# Patient Record
Sex: Male | Born: 1970 | Race: White | Hispanic: No
Health system: Midwestern US, Community
[De-identification: ages and names within clinical notes are randomized; demographics above are authoritative.]

## PROBLEM LIST (undated history)

## (undated) DIAGNOSIS — F32A Depression, unspecified: Secondary | ICD-10-CM

## (undated) DIAGNOSIS — S069XAA Unspecified intracranial injury with loss of consciousness status unknown, initial encounter: Secondary | ICD-10-CM

## (undated) DIAGNOSIS — I10 Essential (primary) hypertension: Secondary | ICD-10-CM

## (undated) DIAGNOSIS — G40409 Other generalized epilepsy and epileptic syndromes, not intractable, without status epilepticus: Secondary | ICD-10-CM

## (undated) DIAGNOSIS — J45909 Unspecified asthma, uncomplicated: Secondary | ICD-10-CM

## (undated) DIAGNOSIS — R519 Headache, unspecified: Secondary | ICD-10-CM

## (undated) DIAGNOSIS — F319 Bipolar disorder, unspecified: Secondary | ICD-10-CM

## (undated) DIAGNOSIS — F329 Major depressive disorder, single episode, unspecified: Secondary | ICD-10-CM

## (undated) DIAGNOSIS — R51 Headache: Secondary | ICD-10-CM

## (undated) DIAGNOSIS — N189 Chronic kidney disease, unspecified: Secondary | ICD-10-CM

## (undated) DIAGNOSIS — E109 Type 1 diabetes mellitus without complications: Secondary | ICD-10-CM

## (undated) DIAGNOSIS — G4730 Sleep apnea, unspecified: Secondary | ICD-10-CM

## (undated) DIAGNOSIS — G43909 Migraine, unspecified, not intractable, without status migrainosus: Secondary | ICD-10-CM

## (undated) DIAGNOSIS — S069X9A Unspecified intracranial injury with loss of consciousness of unspecified duration, initial encounter: Secondary | ICD-10-CM

## (undated) DIAGNOSIS — E7800 Pure hypercholesterolemia, unspecified: Secondary | ICD-10-CM

## (undated) DIAGNOSIS — I219 Acute myocardial infarction, unspecified: Secondary | ICD-10-CM

## (undated) DIAGNOSIS — F419 Anxiety disorder, unspecified: Secondary | ICD-10-CM

## (undated) DIAGNOSIS — I639 Cerebral infarction, unspecified: Secondary | ICD-10-CM

## (undated) HISTORY — PX: CHOLECYSTECTOMY OPEN: SUR202

---
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
## 2015-04-02 ENCOUNTER — Emergency Department (HOSPITAL_COMMUNITY): Payer: Self-pay

## 2015-04-02 ENCOUNTER — Encounter (HOSPITAL_COMMUNITY): Payer: Self-pay

## 2015-04-02 ENCOUNTER — Emergency Department (HOSPITAL_COMMUNITY)
Admission: EM | Admit: 2015-04-02 | Discharge: 2015-04-02 | Disposition: A | Discharge status: Home / Self Care | Payer: Self-pay | Attending: Emergency Medicine | Admitting: Emergency Medicine

## 2015-04-02 DIAGNOSIS — R079 Chest pain, unspecified: Secondary | ICD-10-CM

## 2015-04-02 DIAGNOSIS — F1410 Cocaine abuse, uncomplicated: Secondary | ICD-10-CM | POA: Insufficient documentation

## 2015-04-02 DIAGNOSIS — F419 Anxiety disorder, unspecified: Secondary | ICD-10-CM

## 2015-04-02 DIAGNOSIS — R000 Tachycardia, unspecified: Secondary | ICD-10-CM | POA: Insufficient documentation

## 2015-04-02 DIAGNOSIS — F1910 Other psychoactive substance abuse, uncomplicated: Secondary | ICD-10-CM

## 2015-04-02 LAB — BASIC METABOLIC PANEL
Anion gap: 12 (ref 5–15)
BUN: 12 mg/dL (ref 6–20)
CHLORIDE: 107 mmol/L (ref 101–111)
CO2: 24 mmol/L (ref 22–32)
CREATININE: 1.16 mg/dL (ref 0.61–1.24)
Calcium: 9.3 mg/dL (ref 8.9–10.3)
GFR calc Af Amer: >60 mL/min (ref >60)
GFR calc non Af Amer: >60 mL/min (ref >60)
GLUCOSE: 97 mg/dL (ref 65–99)
POTASSIUM: 4.1 mmol/L (ref 3.5–5.1)
SODIUM: 143 mmol/L (ref 135–145)

## 2015-04-02 LAB — TROPONIN I

## 2015-04-02 LAB — CBC
HEMATOCRIT: 43.8 % (ref 39.0–52.0)
HEMOGLOBIN: 14.9 g/dL (ref 13.0–17.0)
MCH: 31.4 pg (ref 26.0–34.0)
MCHC: 34 g/dL (ref 30.0–36.0)
MCV: 92.4 fL (ref 78.0–100.0)
Platelets: 276 10*3/uL (ref 150–400)
RBC: 4.74 MIL/uL (ref 4.22–5.81)
RDW: 13.3 % (ref 11.5–15.5)
WBC: 9.2 10*3/uL (ref 4.0–10.5)

## 2015-04-02 LAB — PHENYTOIN LEVEL, TOTAL: Phenytoin Lvl: 9.5 ug/mL — ABNORMAL LOW (ref 10.0–20.0)

## 2015-04-02 LAB — I-STAT TROPONIN, ED: Troponin i, poc: 0 ng/mL (ref 0.00–0.08)

## 2015-04-02 MED ORDER — ASPIRIN 81 MG PO CHEW
324.0000 mg | CHEWABLE_TABLET | Freq: Once | ORAL | Status: AC
Start: 2015-04-02 — End: 2015-04-02
  Administered 2015-04-02: 324 mg via ORAL
  Filled 2015-04-02: qty 4

## 2015-04-02 MED ORDER — PHENYTOIN SODIUM EXTENDED 100 MG PO CAPS: 300.0000 mg | ORAL_CAPSULE | Freq: Every day | ORAL | Status: DC

## 2015-04-02 MED ORDER — SODIUM CHLORIDE 0.9 % IV BOLUS (SEPSIS)
1000.0000 mL | Freq: Once | INTRAVENOUS | Status: AC
  Administered 2015-04-02: 1000 mL via INTRAVENOUS

## 2015-04-02 MED ORDER — LORAZEPAM 2 MG/ML IJ SOLN
0.5000 mg | Freq: Once | INTRAMUSCULAR | Status: AC
  Administered 2015-04-02: 0.5 mg via INTRAVENOUS
  Filled 2015-04-02: qty 1

## 2015-04-02 NOTE — ED Provider Notes (Signed)
CSN: [id number]     Arrival date & time 04/02/15  1728 History   First MD Initiated Contact with Patient 04/02/15 1748     Chief Complaint  Patient presents with  . Chest Pain  . Anxiety     (Consider location/radiation/quality/duration/timing/severity/associated sxs/prior Treatment) HPI Barry Daugherty is a 44 y.o. male with history of seizures, presents to emergency department complaining of chest pain, palpitations, anxiety, and requesting help with substance abuse. Patient reports using cocaine  "when I get money." He states he can go several days without using cocaine. He denies drinking alcohol. He states he takes pills as well, states "only when I can get them." He reports using crack cocaine approximately hour and a half ago. He states he just moved to [place] today because "was to saline has nothing for me." Patient reports he is homeless. He lost his Dilantin which she takes for his seizures. He states he had a seizure yesterday was actually seen in emergency department at [facility] where he received a loading dose of Dilantin. Patient states he was discharged without a prescription because he already had medications, but states when he stays at a shelter last night and he lost all of his medicines. He reports he has been to [facility] for his substance abuse in the past but states it did not help him. He is requesting help with his substance abuse. PT denies hx of cardiac or lung problems. He states he only smokes  when he does drugs.   Past Medical History  Diagnosis Date  . Seizures [facility])    Past Surgical History  Procedure Laterality Date  . Nephrectomy    . Cholecystectomy     No family history on file. Social History  Substance Use Topics  . Smoking status: Not on file  . Smokeless tobacco: Not on file  . Alcohol Use: Not on file    Review of Systems  Constitutional: Negative for fever and chills.  Respiratory: Positive for chest tightness and shortness of  breath. Negative for cough.   Cardiovascular: Positive for chest pain and palpitations. Negative for leg swelling.  Gastrointestinal: Negative for nausea, vomiting, abdominal pain, diarrhea and abdominal distention.  Genitourinary: Negative for urgency.  Musculoskeletal: Negative for myalgias, arthralgias, neck pain and neck stiffness.  Skin: Negative for rash.  Allergic/Immunologic: Negative for immunocompromised state.  Neurological: Negative for dizziness, weakness, light-headedness, numbness and headaches.  Psychiatric/Behavioral: Positive for dysphoric mood. The patient is nervous/anxious.   All other systems reviewed and are negative.     Allergies  Ceftin and Reglan  Home Medications   Prior to Admission medications   Not on File   BP 150/118 mmHg  Pulse 105  Temp(Src) 97.9 F (36.6 C) (Oral)  Resp 15  SpO2 99% Physical Exam  Constitutional: He is oriented to person, place, and time. He appears well-developed and well-nourished.  Disheveled. tearful  HENT:  Head: Normocephalic and atraumatic.  Eyes: Conjunctivae are normal.  Neck: Neck supple.  Cardiovascular: Regular rhythm and normal heart sounds.   tachycardic  Pulmonary/Chest: Effort normal. No respiratory distress. He has no wheezes. He has no rales.  Abdominal: Soft. Bowel sounds are normal. He exhibits no distension. There is no tenderness. There is no rebound.  Musculoskeletal: He exhibits no edema.  Neurological: He is alert and oriented to person, place, and time.  Skin: Skin is warm and dry.  Nursing note and vitals reviewed.   ED Course  Procedures (including critical care time) Labs Review Labs  Reviewed  PHENYTOIN LEVEL, TOTAL - Abnormal; Notable for the following:    Phenytoin Lvl 9.5 (*)    All other components within normal limits  BASIC METABOLIC PANEL  CBC  TROPONIN I  I-STAT TROPOININ, ED    Imaging Review Dg Chest 2 View  04/02/2015  CLINICAL DATA:  Anxiety with tachycardia,  chest pain and dizziness after illicit drug use. EXAM: CHEST  2 VIEW COMPARISON:  None. FINDINGS: The heart size and mediastinal contours are normal. The lungs are clear. There is no pleural effusion or pneumothorax. No acute osseous findings are identified. Surgical clips are noted within the upper abdomen. IMPRESSION: No active cardiopulmonary process. Electronically Signed   By: Carey Bullocks M.D.   On: 04/02/2015 18:46   I have personally reviewed and evaluated these images and lab results as part of my medical decision-making.   EKG Interpretation   Date/Time:  Sunday April 02 2015 17:40:32 EST Ventricular Rate:  111 PR Interval:  156 QRS Duration: 81 QT Interval:  324 QTC Calculation: 440 R Axis:   89 Text Interpretation:  Sinus tachycardia Anterior infarct, old No acute  ischemic findings No previous ECGs available Confirmed by NGUYEN, EMILY  [phone]) on 04/02/2015 7:27:56 PM      MDM   Final diagnoses:  Chest pain, unspecified chest pain type  Anxiety  Polysubstance abuse    patient emergency department with palpitations, chest pain, anxiety after doing cocaine. He is requesting substance abuse detox. Patient is mildly tachycardic and hypotensive upon arrival. Appears anxious. States he just moved from [place] here, however he states he is homeless. I tried to pull up his records from [facility] but was unable to. Will get labs, troponin, chest x-ray.   Initial labs, troponin unremarkable. Patient is tachycardic. 1 L of fluids given IV. Ativan 0.5 mg given IV. Will recheck delta troponin in 4 hours.   Patient chest pain-free. Vitals improved. Heart rate in 70s at rest. Eating sandwich and drinking Coke. Delta troponin is negative. Bloody discharge home with outpatient resources and follow-up. Denies SI and HI, stable for discharge home.  Filed Vitals:   04/02/15 1739 04/02/15 2027 04/02/15 2030 04/02/15 2100  BP: 150/118 140/93 130/78 127/85  Pulse: 105 99  96   Temp: 97.9 F (36.6 C) 98.3 F (36.8 C)    TempSrc: Oral Oral    Resp: 15 18  18   SpO2: 99% 97%  95%     Jaynie Crumble, PA-C 04/03/15 0025  Leta Baptist, MD 04/11/15 [phone]

## 2015-04-02 NOTE — ED Notes (Signed)
Patient was alert, oriented and stable upon discharge. RN went over AVS and patient had no further questions.  

## 2015-04-02 NOTE — ED Notes (Addendum)
Per EMS, Pt. C/o anxiety tachycardia, chest pain, after using crack cocaine 1.5 hr ago . Chest pain aggravated by movement- EMS ekg unremarkable. Hx of Substance abuse- wants help. Pt c/o anxiety, hx of seizures. Having aura, dizziness, light sensitivity, headache. Lost seizure meds last night. 109 cbg

## 2015-04-02 NOTE — Discharge Instructions (Signed)
Make sure to continue your Dilantin. Please follow-up with the resources provided. Return if any issues.  Polysubstance Abuse When people abuse more than one drug or type of drug it is called polysubstance or polydrug abuse. For example, many smokers also drink alcohol. This is one form of polydrug abuse. Polydrug abuse also refers to the use of a drug to counteract an unpleasant effect produced by another drug. It may also be used to help with withdrawal from another drug. People who take stimulants may become agitated. Sometimes this agitation is countered with a tranquilizer. This helps protect against the unpleasant side effects. Polydrug abuse also refers to the use of different drugs at the same time.  Anytime drug use is interfering with normal living activities, it has become abuse. This includes problems with family and friends. Psychological dependence has developed when your mind tells you that the drug is needed. This is usually followed by physical dependence which has developed when continuing increases of drug are required to get the same feeling or "high". This is known as addiction or chemical dependency. A person's risk is much higher if there is a history of chemical dependency in the family. SIGNS OF CHEMICAL DEPENDENCY  You have been told by friends or family that drugs have become a problem.  You fight when using drugs.  You are having blackouts (not remembering what you do while using).  You feel sick from using drugs but continue using.  You lie about use or amounts of drugs (chemicals) used.  You need chemicals to get you going.  You are suffering in work performance or in school because of drug use.  You get sick from use of drugs but continue to use anyway.  You need drugs to relate to people or feel comfortable in social situations.  You use drugs to forget problems. "Yes" answered to any of the above signs of chemical dependency indicates there are problems. The  longer the use of drugs continues, the greater the problems will become. If there is a family history of drug or alcohol use, it is best not to experiment with these drugs. Continual use leads to tolerance. After tolerance develops more of the drug is needed to get the same feeling. This is followed by addiction. With addiction, drugs become the most important part of life. It becomes more important to take drugs than participate in the other usual activities of life. This includes relating to friends and family. Addiction is followed by dependency. Dependency is a condition where drugs are now needed not just to get high, but to feel normal. Addiction cannot be cured but it can be stopped. This often requires outside help and the care of professionals. Treatment centers are listed in the yellow pages under: Cocaine, Narcotics, and Alcoholics Anonymous. Most hospitals and clinics can refer you to a specialized care center. Talk to your caregiver if you need help.   This information is not intended to replace advice given to you by your health care provider. Make sure you discuss any questions you have with your health care provider.   Document Released: 11/21/2004 Document Revised: 06/24/2011 Document Reviewed: 04/06/2014 Elsevier Interactive Patient Education [employer]

## 2015-04-03 ENCOUNTER — Emergency Department (HOSPITAL_COMMUNITY)
Admission: EM | Admit: 2015-04-03 | Discharge: 2015-04-03 | Disposition: A | Discharge status: Other Acute Inpatient Hospital | Payer: Self-pay | Attending: Emergency Medicine | Admitting: Emergency Medicine

## 2015-04-03 ENCOUNTER — Encounter (HOSPITAL_COMMUNITY): Payer: Self-pay | Admitting: Cardiology

## 2015-04-03 DIAGNOSIS — F1410 Cocaine abuse, uncomplicated: Secondary | ICD-10-CM | POA: Insufficient documentation

## 2015-04-03 DIAGNOSIS — F419 Anxiety disorder, unspecified: Secondary | ICD-10-CM | POA: Insufficient documentation

## 2015-04-03 DIAGNOSIS — R197 Diarrhea, unspecified: Secondary | ICD-10-CM | POA: Insufficient documentation

## 2015-04-03 DIAGNOSIS — G40909 Epilepsy, unspecified, not intractable, without status epilepticus: Secondary | ICD-10-CM | POA: Insufficient documentation

## 2015-04-03 DIAGNOSIS — R45851 Suicidal ideations: Secondary | ICD-10-CM

## 2015-04-03 DIAGNOSIS — R079 Chest pain, unspecified: Secondary | ICD-10-CM | POA: Insufficient documentation

## 2015-04-03 DIAGNOSIS — R630 Anorexia: Secondary | ICD-10-CM | POA: Insufficient documentation

## 2015-04-03 DIAGNOSIS — F1310 Sedative, hypnotic or anxiolytic abuse, uncomplicated: Secondary | ICD-10-CM | POA: Insufficient documentation

## 2015-04-03 DIAGNOSIS — F329 Major depressive disorder, single episode, unspecified: Secondary | ICD-10-CM | POA: Insufficient documentation

## 2015-04-03 DIAGNOSIS — Z79899 Other long term (current) drug therapy: Secondary | ICD-10-CM | POA: Insufficient documentation

## 2015-04-03 LAB — COMPREHENSIVE METABOLIC PANEL
ALBUMIN: 3.8 g/dL (ref 3.5–5.0)
ALT: 43 U/L (ref 17–63)
ANION GAP: 9 (ref 5–15)
AST: 35 U/L (ref 15–41)
Alkaline Phosphatase: 65 U/L (ref 38–126)
BUN: 12 mg/dL (ref 6–20)
CALCIUM: 9.7 mg/dL (ref 8.9–10.3)
CO2: 23 mmol/L (ref 22–32)
Chloride: 109 mmol/L (ref 101–111)
Creatinine, Ser: 1.21 mg/dL (ref 0.61–1.24)
GFR calc non Af Amer: >60 mL/min (ref >60)
GLUCOSE: 161 mg/dL — AB (ref 65–99)
POTASSIUM: 3.9 mmol/L (ref 3.5–5.1)
SODIUM: 141 mmol/L (ref 135–145)
TOTAL PROTEIN: 6.5 g/dL (ref 6.5–8.1)
Total Bilirubin: 0.4 mg/dL (ref 0.3–1.2)

## 2015-04-03 LAB — CBC
HEMATOCRIT: 43.1 % (ref 39.0–52.0)
Hemoglobin: 14.2 g/dL (ref 13.0–17.0)
MCH: 30.7 pg (ref 26.0–34.0)
MCHC: 32.9 g/dL (ref 30.0–36.0)
MCV: 93.1 fL (ref 78.0–100.0)
Platelets: 261 10*3/uL (ref 150–400)
RBC: 4.63 MIL/uL (ref 4.22–5.81)
RDW: 13.5 % (ref 11.5–15.5)
WBC: 8 10*3/uL (ref 4.0–10.5)

## 2015-04-03 LAB — ACETAMINOPHEN LEVEL

## 2015-04-03 LAB — ETHANOL: Alcohol, Ethyl (B): <5 mg/dL (ref <5)

## 2015-04-03 LAB — RAPID URINE DRUG SCREEN, HOSP PERFORMED
Amphetamines: NOT DETECTED
BARBITURATES: NOT DETECTED
BENZODIAZEPINES: POSITIVE — AB
COCAINE: POSITIVE — AB
OPIATES: NOT DETECTED
Tetrahydrocannabinol: NOT DETECTED

## 2015-04-03 LAB — SALICYLATE LEVEL

## 2015-04-03 LAB — TROPONIN I

## 2015-04-03 MED ORDER — GABAPENTIN 100 MG PO CAPS
100.0000 mg | ORAL_CAPSULE | Freq: Four times a day (QID) | ORAL | Status: DC
  Administered 2015-04-03 (×2): 100 mg via ORAL
  Filled 2015-04-03 (×2): qty 1

## 2015-04-03 MED ORDER — ATENOLOL 25 MG PO TABS
50.0000 mg | ORAL_TABLET | Freq: Every day | ORAL | Status: DC
  Administered 2015-04-03: 50 mg via ORAL
  Filled 2015-04-03: qty 2

## 2015-04-03 MED ORDER — HYDROXYZINE HCL 25 MG PO TABS: 100.0000 mg | ORAL_TABLET | Freq: Four times a day (QID) | ORAL | Status: DC | PRN

## 2015-04-03 MED ORDER — AMLODIPINE BESYLATE 5 MG PO TABS
10.0000 mg | ORAL_TABLET | Freq: Every day | ORAL | Status: DC
  Administered 2015-04-03: 10 mg via ORAL
  Filled 2015-04-03: qty 2

## 2015-04-03 MED ORDER — PHENYTOIN SODIUM EXTENDED 100 MG PO CAPS
300.0000 mg | ORAL_CAPSULE | Freq: Every day | ORAL | Status: DC
  Administered 2015-04-03: 300 mg via ORAL
  Filled 2015-04-03: qty 3

## 2015-04-03 MED ORDER — HYDROXYZINE PAMOATE 50 MG PO CAPS
100.0000 mg | ORAL_CAPSULE | Freq: Four times a day (QID) | ORAL | Status: DC | PRN
  Filled 2015-04-03: qty 2

## 2015-04-03 MED ORDER — GABAPENTIN 300 MG PO CAPS
300.0000 mg | ORAL_CAPSULE | Freq: Once | ORAL | Status: AC
  Administered 2015-04-03: 300 mg via ORAL
  Filled 2015-04-03: qty 1

## 2015-04-03 NOTE — ED Notes (Signed)
Pt desats down to the low 80s while sleeping. Dr. Criss Alvine informed.

## 2015-04-03 NOTE — Clinical Social Work Note (Signed)
CSW spoke with RN to provide pt disposition of Observation unit, bed 3 available 8 pm this evening.  Elray Buba LCSW [facility]/Triage/TTS

## 2015-04-03 NOTE — ED Notes (Signed)
Bed at Observation for bed 3, not available until 8pm.
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
## 2015-04-03 NOTE — ED Notes (Signed)
Pt ordered lunch tray. Pt encouraged to provide urine specimen.

## 2015-04-03 NOTE — ED Provider Notes (Signed)
Accepted by Dr. Lucianne Muss to [facility]. In stable condition and appropriate for transfer  Lavera Guise, MD 04/03/15 2138

## 2015-04-03 NOTE — Progress Notes (Signed)
CPAP machine set-up for patient prior to transfer to [facility]. RN instructed on how to use machine and all information needed to pass onto receiving RN.

## 2015-04-03 NOTE — ED Notes (Signed)
Pt states he hasn't had access to any of his medications x7 days, but found one dose of each in his pocket yesterday and took them. Pt states he last used crack yesterday about $40 worth and denies any recent crystal meth use. Pt also denies alcohol or opiate use.

## 2015-04-03 NOTE — ED Notes (Signed)
Pt reports that he wants treatment from Detox from crystal meth, and crack. States he is having thoughts of hurting himself over the past couple of days. No plan, no ETOH.

## 2015-04-03 NOTE — ED Notes (Signed)
Staffing called for sitter.   

## 2015-04-03 NOTE — ED Provider Notes (Signed)
CSN: [id number]     Arrival date & time 04/03/15  1106 History   First MD Initiated Contact with Patient 04/03/15 1131     Chief Complaint  Patient presents with  . Medical Clearance  . Suicidal     (Consider location/radiation/quality/duration/timing/severity/associated sxs/prior Treatment) HPI   Mr. Hefner is a 44 year old with a history of polysubstance abuse, anxiety, depression who presents with a desire for assistance abstaining from crack cocaine, SI, and chest pain. He reports last smoking crack cocaine yesterday morning - about "40 dollars worth." Afterward, he had a non-radiating chest pain that has persistent until today. He reports that the chest pain is reproduced when he presses on his chest. He has smoked crack cocaine for years, but he has also used methamphetamine, although not in at least a month. He's had persistent feelings of depression and anxiety. Most recently, he describes a situation of thinking about taking a fistful of his dilantin, but he threw it away before he could do it. His last suicide attempt was when he was 44 years old with an attempted hanging. He has a past medical history of seizures, his last being "last Friday" at [facility], in which he lost consciousness for a time and urinated on himself. He has not taken any dilantin in at least 7 days. He denies any alcohol use.    Past Medical History  Diagnosis Date  . Seizures [facility])    Past Surgical History  Procedure Laterality Date  . Nephrectomy    . Cholecystectomy     History reviewed. No pertinent family history. Social History  Substance Use Topics  . Smoking status: Current Some Day Smoker    Types: Cigarettes  . Smokeless tobacco: None  . Alcohol Use: No    Review of Systems  Constitutional: Positive for appetite change. Negative for fever, chills and fatigue.  HENT: Negative for congestion, rhinorrhea and sore throat.   Eyes: Negative for visual disturbance.  Respiratory: Negative for  cough, shortness of breath and wheezing.   Cardiovascular: Positive for chest pain. Negative for palpitations and leg swelling.  Gastrointestinal: Positive for diarrhea. Negative for nausea, abdominal pain and constipation.       One month of watery diarrhea.  Genitourinary: Negative for dysuria.  Musculoskeletal: Negative for arthralgias.  Skin: Negative for rash.  Neurological: Negative for dizziness and headaches.  Psychiatric/Behavioral: Positive for suicidal ideas and dysphoric mood. The patient is nervous/anxious.       Allergies  Ceftin and Reglan  Home Medications   Prior to Admission medications   Medication Sig Start Date End Date Taking? Authorizing Provider  amLODipine (NORVASC) 10 MG tablet Take 10 mg by mouth daily.    Historical Provider, MD  atenolol (TENORMIN) 50 MG tablet Take 50 mg by mouth daily.    Historical Provider, MD  gabapentin (NEURONTIN) 100 MG capsule Take 100 mg by mouth 4 (four) times daily. 01/17/15   Historical Provider, MD  hydrOXYzine (VISTARIL) 100 MG capsule Take 100 mg by mouth 4 (four) times daily as needed for itching or anxiety.    Historical Provider, MD  phenytoin (DILANTIN) 100 MG ER capsule Take 3 capsules (300 mg total) by mouth at bedtime. 04/02/15   Tatyana Kirichenko, PA-C   BP 126/74 mmHg  Pulse 88  Temp(Src) 98.3 F (36.8 C) (Oral)  Resp 18  SpO2 98% Physical Exam  Constitutional: He is oriented to person, place, and time. He appears well-developed and well-nourished. No distress.  HENT:  Mouth/Throat:  No oropharyngeal exudate.  Eyes: Pupils are equal, round, and reactive to light. No scleral icterus.  Neck: No JVD present.  Cardiovascular: Normal rate, regular rhythm and normal heart sounds.   Pulmonary/Chest: Effort normal and breath sounds normal. No respiratory distress. He has no wheezes. He has no rales. He exhibits tenderness.  Tenderness to palpation of center chest.  Abdominal: Soft. Bowel sounds are normal. He  exhibits no distension and no mass. There is no tenderness. There is no rebound and no guarding.  Musculoskeletal: He exhibits no edema or tenderness.  Lymphadenopathy:    He has no cervical adenopathy.  Neurological: He is alert and oriented to person, place, and time. He has normal reflexes.  Skin: Skin is warm and dry.  Psychiatric:  Anxious appearing.  Vitals reviewed.   ED Course  Procedures (including critical care time) Labs Review Labs Reviewed  CBC  COMPREHENSIVE METABOLIC PANEL  ETHANOL  SALICYLATE LEVEL  ACETAMINOPHEN LEVEL  URINE RAPID DRUG SCREEN, HOSP PERFORMED  I-STAT TROPOININ, ED    Imaging Review Dg Chest 2 View  04/02/2015  CLINICAL DATA:  Anxiety with tachycardia, chest pain and dizziness after illicit drug use. EXAM: CHEST  2 VIEW COMPARISON:  None. FINDINGS: The heart size and mediastinal contours are normal. The lungs are clear. There is no pleural effusion or pneumothorax. No acute osseous findings are identified. Surgical clips are noted within the upper abdomen. IMPRESSION: No active cardiopulmonary process. Electronically Signed   By: Carey Bullocks M.D.   On: 04/02/2015 18:46   I have personally reviewed and evaluated these images and lab results as part of my medical decision-making.   EKG Interpretation None      MDM   Final diagnoses:  None   Troponin I negative. EKG unchanged since previous tracing yesterday. Ordered Sitter.  Troponin normal. EKG unremarkable. Troponin from 12/18 noted to be normal as well, ACS unlikely.  While sleeping, patient noted to desat to 80s, improves to >96% when awakened. Patient reports that he is "supposed to" use CPAP at night. Ordered CPAP QHS.  He was medically cleared and admitted to Psychiatry.  Pricilla Loveless, MD 04/07/15 [phone]

## 2015-04-04 ENCOUNTER — Encounter (HOSPITAL_COMMUNITY): Payer: Self-pay | Admitting: *Deleted

## 2015-04-04 ENCOUNTER — Observation Stay (HOSPITAL_COMMUNITY)
Admission: AD | Admit: 2015-04-04 | Discharge: 2015-04-04 | Disposition: A | Discharge status: Home / Self Care | Payer: Federal, State, Local not specified - Other | Source: Intra-hospital | Attending: Physician Assistant | Admitting: Physician Assistant

## 2015-04-04 DIAGNOSIS — Z590 Homelessness: Secondary | ICD-10-CM | POA: Insufficient documentation

## 2015-04-04 DIAGNOSIS — F1124 Opioid dependence with opioid-induced mood disorder: Secondary | ICD-10-CM | POA: Insufficient documentation

## 2015-04-04 DIAGNOSIS — F1994 Other psychoactive substance use, unspecified with psychoactive substance-induced mood disorder: Secondary | ICD-10-CM | POA: Diagnosis present

## 2015-04-04 DIAGNOSIS — F411 Generalized anxiety disorder: Secondary | ICD-10-CM | POA: Diagnosis present

## 2015-04-04 DIAGNOSIS — F17210 Nicotine dependence, cigarettes, uncomplicated: Secondary | ICD-10-CM | POA: Insufficient documentation

## 2015-04-04 DIAGNOSIS — F332 Major depressive disorder, recurrent severe without psychotic features: Principal | ICD-10-CM | POA: Diagnosis present

## 2015-04-04 DIAGNOSIS — Z888 Allergy status to other drugs, medicaments and biological substances status: Secondary | ICD-10-CM | POA: Insufficient documentation

## 2015-04-04 DIAGNOSIS — F1424 Cocaine dependence with cocaine-induced mood disorder: Secondary | ICD-10-CM | POA: Insufficient documentation

## 2015-04-04 MED ORDER — ALUM & MAG HYDROXIDE-SIMETH 200-200-20 MG/5ML PO SUSP: 30.0000 mL | ORAL | Status: DC | PRN

## 2015-04-04 MED ORDER — PHENYTOIN SODIUM EXTENDED 300 MG PO CAPS: 300.0000 mg | ORAL_CAPSULE | Freq: Every day | ORAL | Status: DC

## 2015-04-04 MED ORDER — ATENOLOL 50 MG PO TABS: 50.0000 mg | ORAL_TABLET | Freq: Every day | ORAL | Status: DC

## 2015-04-04 MED ORDER — CHLORDIAZEPOXIDE HCL 25 MG PO CAPS
25.0000 mg | ORAL_CAPSULE | Freq: Four times a day (QID) | ORAL | Status: DC
  Administered 2015-04-04 (×2): 25 mg via ORAL
  Filled 2015-04-04 (×2): qty 1

## 2015-04-04 MED ORDER — TRAZODONE HCL 50 MG PO TABS
50.0000 mg | ORAL_TABLET | Freq: Every evening | ORAL | Status: DC | PRN
  Administered 2015-04-04: 50 mg via ORAL
  Filled 2015-04-04: qty 1

## 2015-04-04 MED ORDER — AMLODIPINE BESYLATE 5 MG PO TABS
10.0000 mg | ORAL_TABLET | Freq: Every day | ORAL | Status: DC
  Administered 2015-04-04: 10 mg via ORAL
  Filled 2015-04-04: qty 2

## 2015-04-04 MED ORDER — HYDROXYZINE HCL 50 MG PO TABS: 50.0000 mg | ORAL_TABLET | Freq: Four times a day (QID) | ORAL | Status: DC | PRN

## 2015-04-04 MED ORDER — CHLORDIAZEPOXIDE HCL 25 MG PO CAPS
25.0000 mg | ORAL_CAPSULE | Freq: Four times a day (QID) | ORAL | Status: DC | PRN
  Administered 2015-04-04: 25 mg via ORAL
  Filled 2015-04-04: qty 1

## 2015-04-04 MED ORDER — VITAMIN B-1 100 MG PO TABS: 100.0000 mg | ORAL_TABLET | Freq: Every day | ORAL | Status: DC

## 2015-04-04 MED ORDER — MAGNESIUM HYDROXIDE 400 MG/5ML PO SUSP
30.0000 mL | Freq: Every day | ORAL | Status: DC | PRN
Start: 2015-04-04 — End: 2015-04-04

## 2015-04-04 MED ORDER — CHLORDIAZEPOXIDE HCL 25 MG PO CAPS: 25.0000 mg | ORAL_CAPSULE | ORAL | Status: DC

## 2015-04-04 MED ORDER — AMLODIPINE BESYLATE 10 MG PO TABS: 10.0000 mg | ORAL_TABLET | Freq: Every day | ORAL | Status: DC

## 2015-04-04 MED ORDER — TRAZODONE HCL 50 MG PO TABS: 50.0000 mg | ORAL_TABLET | Freq: Every evening | ORAL | Status: DC | PRN

## 2015-04-04 MED ORDER — THIAMINE HCL 100 MG/ML IJ SOLN
100.0000 mg | Freq: Once | INTRAMUSCULAR | Status: AC
  Administered 2015-04-04: 100 mg via INTRAMUSCULAR
  Filled 2015-04-04: qty 2

## 2015-04-04 MED ORDER — ACETAMINOPHEN 325 MG PO TABS: 650.0000 mg | ORAL_TABLET | Freq: Four times a day (QID) | ORAL | Status: DC | PRN

## 2015-04-04 MED ORDER — GABAPENTIN 100 MG PO CAPS
100.0000 mg | ORAL_CAPSULE | Freq: Four times a day (QID) | ORAL | Status: DC
  Administered 2015-04-04 (×2): 100 mg via ORAL
  Filled 2015-04-04 (×2): qty 1

## 2015-04-04 MED ORDER — ADULT MULTIVITAMIN W/MINERALS CH
1.0000 | ORAL_TABLET | Freq: Every day | ORAL | Status: DC
  Administered 2015-04-04: 1 via ORAL
  Filled 2015-04-04: qty 1

## 2015-04-04 MED ORDER — CHLORDIAZEPOXIDE HCL 25 MG PO CAPS: 25.0000 mg | ORAL_CAPSULE | Freq: Every day | ORAL | Status: DC

## 2015-04-04 MED ORDER — HYDROXYZINE HCL 25 MG PO TABS: 25.0000 mg | ORAL_TABLET | Freq: Four times a day (QID) | ORAL | Status: DC | PRN

## 2015-04-04 MED ORDER — LOPERAMIDE HCL 2 MG PO CAPS: 2.0000 mg | ORAL_CAPSULE | ORAL | Status: DC | PRN

## 2015-04-04 MED ORDER — ATENOLOL 25 MG PO TABS
50.0000 mg | ORAL_TABLET | Freq: Every day | ORAL | Status: DC
  Administered 2015-04-04: 50 mg via ORAL
  Filled 2015-04-04: qty 2

## 2015-04-04 MED ORDER — CHLORDIAZEPOXIDE HCL 25 MG PO CAPS: 25.0000 mg | ORAL_CAPSULE | Freq: Three times a day (TID) | ORAL | Status: DC

## 2015-04-04 MED ORDER — PHENYTOIN SODIUM EXTENDED 100 MG PO CAPS: 300.0000 mg | ORAL_CAPSULE | Freq: Every day | ORAL | Status: DC

## 2015-04-04 MED ORDER — GABAPENTIN 100 MG PO CAPS: 100.0000 mg | ORAL_CAPSULE | Freq: Four times a day (QID) | ORAL | Status: DC

## 2015-04-04 NOTE — Progress Notes (Signed)
[facility] INPATIENT:  Family/Significant Other Suicide Prevention Education  Suicide Prevention Education:  Patient Refusal for Family/Significant Other Suicide Prevention Education: The patient Barry Daugherty has refused to provide written consent for family/significant other to be provided Family/Significant Other Suicide Prevention Education during admission and/or prior to discharge.  Physician notified.  Roselie Skinner [facility] 04/04/2015, 12:32 AM
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
## 2015-04-04 NOTE — H&P (Signed)
[facility] OBS UNIT H&P  Patient Identification: Barry Daugherty MRN:  [id number] Date of Evaluation:  04/04/2015 Chief Complaint:  SI with plan Principal Diagnosis: Substance induced Mood Disorder, MDD Recurrent severe without Psychotic features, GAD   Patient Active Problem List   Diagnosis Date Noted  . Substance induced mood disorder ([place]) [F19.94] 04/04/2015  . Severe episode of recurrent major depressive disorder, without psychotic features ([place]) [F33.2]   . GAD (generalized anxiety disorder) [F41.1]    History of Present Illness: Mr Barry Daugherty is a 44 y/o WM, whom has been transient presenting to the [facility] ED requesting detox from crack cocaine and opoid usage. Patient has had polysubstance addiction for > 23 years but has come to the end of the line where he wants healing over illicit drugs. Due to the cyclical and reoccurring addition he is endorsing depressive symptoms to include guilt, low self esteem, feelings of hopelessness, helplessness and worthlessness. Mr Barry Daugherty was endorsing SI with plan to O/D. The patient has a prior attempt via hanging at age 44 and also reportedly was going to take an O/D of his Dilantin 5 days ago. He is also endorsing racing thoughts, insomnia and ruminating. He is denying any AVH, paranoia or delusional thoughts. He denies any hypomania symptoms, rapid cycling or intrusive thoughts. Associated Signs/Symptoms: Depression Symptoms:  depressed mood, feelings of worthlessness/guilt, hopelessness, suicidal thoughts with specific plan, (Hypo) Manic Symptoms:  N/A Anxiety Symptoms:  N/A Psychotic Symptoms:  Paranoia, PTSD Symptoms: Negative Total Time spent with patient: 30 minutes  Past Psychiatric History: MDD, GAD, polysubstance abuse  Risk to Self: Is patient at risk for suicide?: No Risk to Others:  no Prior Inpatient Therapy:  yes Prior Outpatient Therapy:  yes  Alcohol Screening:   Substance Abuse History in the last 12 months:  Yes.   Consequences of  Substance Abuse: Family Consequences:  estranged Previous Psychotropic Medications: Yes  Psychological Evaluations: Yes  Past Medical History:  Past Medical History  Diagnosis Date  . Seizures [facility])     Past Surgical History  Procedure Laterality Date  . Nephrectomy    . Cholecystectomy     Family History: History reviewed. No pertinent family history. Family Psychiatric  History:unknown Social History:  History  Alcohol Use No     History  Drug Use  . Yes  . Special: Cocaine, Methamphetamines    Social History   Social History  . Marital Status: Single    Spouse Name: N/A  . Number of Children: N/A  . Years of Education: N/A   Social History Main Topics  . Smoking status: Current Some Day Smoker    Types: Cigarettes  . Smokeless tobacco: None  . Alcohol Use: No  . Drug Use: Yes    Special: Cocaine, Methamphetamines  . Sexual Activity: Not Asked   Other Topics Concern  . None   Social History Narrative   Additional Social History:                         Allergies:   Allergies  Allergen Reactions  . Ceftin [Cefuroxime Axetil] Hives  . Reglan [Metoclopramide] Itching   Lab Results:  Results for orders placed or performed during the hospital encounter of 04/03/15 (from the past 48 hour(s))  Comprehensive metabolic panel     Status: Abnormal   Collection Time: 04/03/15 11:22 AM  Result Value Ref Range   Sodium 141 135 - 145 mmol/L   Potassium 3.9 3.5 - 5.1 mmol/L  Chloride 109 101 - 111 mmol/L   CO2 23 22 - 32 mmol/L   Glucose, Bld 161 (H) 65 - 99 mg/dL   BUN 12 6 - 20 mg/dL   Creatinine, Ser 1.21 0.61 - 1.24 mg/dL   Calcium 9.7 8.9 - 10.3 mg/dL   Total Protein 6.5 6.5 - 8.1 g/dL   Albumin 3.8 3.5 - 5.0 g/dL   AST 35 15 - 41 U/L   ALT 43 17 - 63 U/L   Alkaline Phosphatase 65 38 - 126 U/L   Total Bilirubin 0.4 0.3 - 1.2 mg/dL   GFR calc non Af Amer >60 >60 mL/min   GFR calc Af Amer >60 >60 mL/min    Comment: (NOTE) The eGFR has been  calculated using the CKD EPI equation. This calculation has not been validated in all clinical situations. eGFR's persistently <60 mL/min signify possible Chronic Kidney Disease.    Anion gap 9 5 - 15  Ethanol (ETOH)     Status: None   Collection Time: 04/03/15 11:22 AM  Result Value Ref Range   Alcohol, Ethyl (B) <5 <5 mg/dL    Comment:        LOWEST DETECTABLE LIMIT FOR SERUM ALCOHOL IS 5 mg/dL FOR MEDICAL PURPOSES ONLY   Salicylate level     Status: None   Collection Time: 04/03/15 11:22 AM  Result Value Ref Range   Salicylate Lvl <5.8 2.8 - 30.0 mg/dL  Acetaminophen level     Status: Abnormal   Collection Time: 04/03/15 11:22 AM  Result Value Ref Range   Acetaminophen (Tylenol), Serum <10 (L) 10 - 30 ug/mL    Comment:        THERAPEUTIC CONCENTRATIONS VARY SIGNIFICANTLY. A RANGE OF 10-30 ug/mL MAY BE AN EFFECTIVE CONCENTRATION FOR MANY PATIENTS. HOWEVER, SOME ARE BEST TREATED AT CONCENTRATIONS OUTSIDE THIS RANGE. ACETAMINOPHEN CONCENTRATIONS >150 ug/mL AT 4 HOURS AFTER INGESTION AND >50 ug/mL AT 12 HOURS AFTER INGESTION ARE OFTEN ASSOCIATED WITH TOXIC REACTIONS.   CBC     Status: None   Collection Time: 04/03/15 11:22 AM  Result Value Ref Range   WBC 8.0 4.0 - 10.5 K/uL   RBC 4.63 4.22 - 5.81 MIL/uL   Hemoglobin 14.2 13.0 - 17.0 g/dL   HCT 43.1 39.0 - 52.0 %   MCV 93.1 78.0 - 100.0 fL   MCH 30.7 26.0 - 34.0 pg   MCHC 32.9 30.0 - 36.0 g/dL   RDW 13.5 11.5 - 15.5 %   Platelets 261 150 - 400 K/uL  Troponin I     Status: None   Collection Time: 04/03/15 11:22 AM  Result Value Ref Range   Troponin I <0.03 <0.031 ng/mL    Comment:        NO INDICATION OF MYOCARDIAL INJURY.   Urine rapid drug screen (hosp performed) (Not at [facility])     Status: Abnormal   Collection Time: 04/03/15  2:00 PM  Result Value Ref Range   Opiates NONE DETECTED NONE DETECTED   Cocaine POSITIVE (A) NONE DETECTED   Benzodiazepines POSITIVE (A) NONE DETECTED   Amphetamines NONE DETECTED  NONE DETECTED   Tetrahydrocannabinol NONE DETECTED NONE DETECTED   Barbiturates NONE DETECTED NONE DETECTED    Comment:        DRUG SCREEN FOR MEDICAL PURPOSES ONLY.  IF CONFIRMATION IS NEEDED FOR ANY PURPOSE, NOTIFY LAB WITHIN 5 DAYS.        LOWEST DETECTABLE LIMITS FOR URINE DRUG SCREEN Drug Class  Cutoff (ng/mL) Amphetamine      1000 Barbiturate      200 Benzodiazepine   737 Tricyclics       106 Opiates          300 Cocaine          300 THC              50     Metabolic Disorder Labs:  No results found for: HGBA1C, MPG No results found for: PROLACTIN No results found for: CHOL, TRIG, HDL, CHOLHDL, VLDL, LDLCALC  Current Medications: Current Facility-Administered Medications  Medication Dose Route Frequency Provider Last Rate Last Dose  . acetaminophen (TYLENOL) tablet 650 mg  650 mg Oral Q6H PRN Laverle Hobby, PA-C      . alum & mag hydroxide-simeth (MAALOX/MYLANTA) 200-200-20 MG/5ML suspension 30 mL  30 mL Oral Q4H PRN Laverle Hobby, PA-C      . amLODipine (NORVASC) tablet 10 mg  10 mg Oral Daily Laverle Hobby, PA-C      . atenolol (TENORMIN) tablet 50 mg  50 mg Oral Daily Laverle Hobby, PA-C      . chlordiazePOXIDE (LIBRIUM) capsule 25 mg  25 mg Oral Q6H PRN Laverle Hobby, PA-C      . chlordiazePOXIDE (LIBRIUM) capsule 25 mg  25 mg Oral QID Laverle Hobby, PA-C       Followed by  . [START ON 04/05/2015] chlordiazePOXIDE (LIBRIUM) capsule 25 mg  25 mg Oral TID Laverle Hobby, PA-C       Followed by  . [START ON 04/06/2015] chlordiazePOXIDE (LIBRIUM) capsule 25 mg  25 mg Oral BH-qamhs Spencer E Simon, PA-C       Followed by  . [START ON 04/07/2015] chlordiazePOXIDE (LIBRIUM) capsule 25 mg  25 mg Oral Daily Laverle Hobby, PA-C      . gabapentin (NEURONTIN) capsule 100 mg  100 mg Oral QID Laverle Hobby, PA-C      . hydrOXYzine (ATARAX/VISTARIL) tablet 25 mg  25 mg Oral Q6H PRN Laverle Hobby, PA-C      . loperamide (IMODIUM) capsule 2-4 mg  2-4 mg  Oral PRN Laverle Hobby, PA-C      . magnesium hydroxide (MILK OF MAGNESIA) suspension 30 mL  30 mL Oral Daily PRN Laverle Hobby, PA-C      . multivitamin with minerals tablet 1 tablet  1 tablet Oral Daily Laverle Hobby, PA-C      . phenytoin (DILANTIN) ER capsule 300 mg  300 mg Oral QHS Spencer E Simon, PA-C      . thiamine (B-1) injection 100 mg  100 mg Intramuscular Once Laverle Hobby, PA-C      . [START ON 04/05/2015] thiamine (VITAMIN B-1) tablet 100 mg  100 mg Oral Daily Laverle Hobby, PA-C      . traZODone (DESYREL) tablet 50 mg  50 mg Oral QHS,MR X 1 Spencer E Simon, PA-C       PTA Medications: Prescriptions prior to admission  Medication Sig Dispense Refill Last Dose  . amLODipine (NORVASC) 10 MG tablet Take 10 mg by mouth daily.   04/04/2015 at Unknown time  . atenolol (TENORMIN) 50 MG tablet Take 50 mg by mouth daily.   04/04/2015 at Unknown time  . gabapentin (NEURONTIN) 100 MG capsule Take 100 mg by mouth 4 (four) times daily.  0 04/04/2015 at Unknown time  . hydrOXYzine (VISTARIL) 100 MG capsule Take 100 mg by mouth  4 (four) times daily as needed for itching or anxiety.   04/04/2015 at Unknown time  . phenytoin (DILANTIN) 100 MG ER capsule Take 3 capsules (300 mg total) by mouth at bedtime. 60 capsule 0 04/04/2015 at Unknown time    Musculoskeletal: Strength & Muscle Tone: within normal limits Gait & Station: normal Patient leans: N/A  Psychiatric Specialty Exam: Physical Exam  Nursing note and vitals reviewed. Constitutional: He is oriented to person, place, and time. He appears well-developed and well-nourished. No distress.  HENT:  Head: Normocephalic.  Neurological: He is alert and oriented to person, place, and time. No cranial nerve deficit.  Skin: Skin is warm and dry. He is not diaphoretic.  Psychiatric:  See Psych Assessment     Review of Systems  Respiratory: Positive for shortness of breath.   Cardiovascular: Positive for chest pain.   Psychiatric/Behavioral: Positive for depression, suicidal ideas and substance abuse. The patient is nervous/anxious and has insomnia.   All other systems reviewed and are negative.   Blood pressure 115/75, pulse 76, temperature 98 F (36.7 C), temperature source Oral, resp. rate 18, height _0  (1.803 m), weight 104.327 kg (230 lb), SpO2 76 %.Body mass index is 32.09 kg/(m^2).  General Appearance: Disheveled  Eye Contact::  Good  Speech:  Garbled  Volume:  Normal  Mood:  Anxious and Depressed  Affect:  Congruent  Thought Process:  Circumstantial  Orientation:  Full (Time, Place, and Person)  Thought Content:  Negative  Suicidal Thoughts:  Yes.  with intent/plan  Homicidal Thoughts:  No  Memory:  Immediate;   Good  Judgement:  Impaired  Insight:  Lacking  Psychomotor Activity:  Normal  Concentration:  Fair  Recall:  [employer] of Knowledge:Fair  Language: Fair  Akathisia:  Negative  Handed:  Right  AIMS (if indicated):     Assets:  Desire for Improvement  ADL's:  Intact  Cognition: WNL  Sleep:        Treatment Plan Summary: Plan to admit to [facility] for crises intervention, safety and stabilization. TTS Staff to reeval in am to aid in disposition plans.  Observation Level/Precautions:  Continuous Observation  Laboratory:    Psychotherapy:    Medications:    Consultations:    Discharge Concerns:    Estimated LOS: 24 - 48 hours  Other:        SIMON,SPENCER E 12/20/20162:06 AM  ADDENDUM: Removed the "I certify inpatient services.....". Removed any wording that states inpatient admission. Changed to [facility] OBS wording." No other changes made.  Benjamine Mola, [place] 04/04/2015 12:04 PM

## 2015-04-04 NOTE — Progress Notes (Signed)
Patient ID: Barry Daugherty, male   DOB: 1970/06/24, 44 y.o.   MRN: [id number] Seem this am by Renata Caprice NP and plan is to discharge him from here with resources. He has been phoning from the resource list given to him by counseling staff for treatment facilities and housing agencies. Has hope for an agency in [facility] and faxed application. Waiting now for a call back from the agency. Pleasant, appropriate, and denies thoughts to hurt self or others. No complaints of withdrawal sx at this time.

## 2015-04-04 NOTE — Progress Notes (Signed)
Patient ID: Barry Daugherty, male   DOB: [birth year], 44 y.o.   MRN: [id number]  Discharged per Renata Caprice NP to [facility] He had called them from resource list and they had several openings and were encouraging of him coming there. He is hopeful and states he feels blessed. He has been most focused on housing and getting a check. He was given a bus ticket to the dep[place] and money for the PART ticket to [employer]. All property returned to him along with an additional pair of pants and shirt since he states he only has one set of clothes. Given set from the closet per the social worker here. Reviewed with him his medications and gave him the six prescriptions for 14 days supply along with instructions. Verbalized his understanding. He denies any thoughts to hurt self or others. He is not currently experiencing any withdrawal sx. Escorted to lobby for discharge.

## 2015-04-04 NOTE — Progress Notes (Signed)
Patient ID: Barry Daugherty, male   DOB: 07/24/1970, 44 y.o.   MRN: [id number] Complained of dizziness when got up from sleeping to use the bathroom. He had Librium and BP meds an hour earlier and had gone from a lying down position to standing. BP checked and it was within normal limites at 132/73 pulse=75. Encouraged fluids and to transition slowly from different positions.

## 2015-04-04 NOTE — Progress Notes (Signed)
This is a 44 years old Caucasian male admitted to the unit for substance abuse. He denied SI/HI and denied Hallucinations. Also denied withdrawal symptoms during this assessment. He is homeless. Mood and affects flat and depressed, although he is very cooperative. Skin assessment within normal limit. Writer encouraged and supported patient. Safety maintained.

## 2015-04-04 NOTE — Discharge Summary (Signed)
OBS UNIT DISCHARGE SUMMARY  Patient Identification: Barry Daugherty MRN:  [id number] Date of Evaluation:  04/04/2015 Principal Diagnosis: Severe episode of recurrent major depressive disorder, without psychotic features [facility])   Patient Active Problem List   Diagnosis Date Noted  . Substance induced mood disorder ([place]) [F19.94] 04/04/2015    Priority: High  . Severe episode of recurrent major depressive disorder, without psychotic features ([place]) [F33.2]     Priority: High  . GAD (generalized anxiety disorder) [F41.1]     Priority: High   Subjective: Pt states: "I'm so depressed because I've been homeless for so long."  Objective: Pt seen and chart reviewed. Pt is alert/oriented x4, calm, cooperative, and appropriate to situation. Pt denies suicidal/homicidal ideation and psychosis and does not appear to be responding to internal stimuli. Pt reports that he now has a place to stay (see social worker notes) and is looking forward to following up with [facility] outpatient. Will give 2 week scripts.   History of Present Illness:  Barry Daugherty is a 44 y/o WM, whom has been transient presenting to the [facility] ED requesting detox from crack cocaine and opoid usage. Patient has had polysubstance addiction for > 23 years but has come to the end of the line where he wants healing over illicit drugs. Due to the cyclical and reoccurring addition he is endorsing depressive symptoms to include guilt, low self esteem, feelings of hopelessness, helplessness and worthlessness. Barry Daugherty was endorsing SI with plan to O/D. The patient has a prior attempt via hanging at age 18 and also reportedly was going to take an O/D of his Dilantin 5 days ago. He is also endorsing racing thoughts, insomnia and ruminating. He is denying any AVH, paranoia or delusional thoughts. He denies any hypomania symptoms, rapid cycling or intrusive thoughts.  Total Time spent with patient: 25 minutes  Past Psychiatric History: MDD, GAD,  polysubstance abuse  Risk to Self: Is patient at risk for suicide?: No Risk to Others:  no Prior Inpatient Therapy:  yes Prior Outpatient Therapy:  yes  Alcohol Screening:   Substance Abuse History in the last 12 months:  Yes.   Consequences of Substance Abuse: Family Consequences:  estranged Previous Psychotropic Medications: Yes  Psychological Evaluations: Yes  Past Medical History:  Past Medical History  Diagnosis Date  . Seizures [facility])     Past Surgical History  Procedure Laterality Date  . Nephrectomy    . Cholecystectomy     Family History: History reviewed. No pertinent family history. Family Psychiatric  History:unknown Social History:  History  Alcohol Use No     History  Drug Use  . Yes  . Special: Cocaine, Methamphetamines    Social History   Social History  . Marital Status: Single    Spouse Name: N/A  . Number of Children: N/A  . Years of Education: N/A   Social History Main Topics  . Smoking status: Current Some Day Smoker    Types: Cigarettes  . Smokeless tobacco: None  . Alcohol Use: No  . Drug Use: Yes    Special: Cocaine, Methamphetamines  . Sexual Activity: Not Asked   Other Topics Concern  . None   Social History Narrative   Additional Social History:                         Allergies:   Allergies  Allergen Reactions  . Ceftin [Cefuroxime Axetil] Hives  . Reglan [Metoclopramide] Itching   Lab Results:  Results for orders placed or performed during the hospital encounter of 04/03/15 (from the past 48 hour(s))  Comprehensive metabolic panel     Status: Abnormal   Collection Time: 04/03/15 11:22 AM  Result Value Ref Range   Sodium 141 135 - 145 mmol/L   Potassium 3.9 3.5 - 5.1 mmol/L   Chloride 109 101 - 111 mmol/L   CO2 23 22 - 32 mmol/L   Glucose, Bld 161 (H) 65 - 99 mg/dL   BUN 12 6 - 20 mg/dL   Creatinine, Ser 1.21 0.61 - 1.24 mg/dL   Calcium 9.7 8.9 - 10.3 mg/dL   Total Protein 6.5 6.5 - 8.1 g/dL   Albumin  3.8 3.5 - 5.0 g/dL   AST 35 15 - 41 U/L   ALT 43 17 - 63 U/L   Alkaline Phosphatase 65 38 - 126 U/L   Total Bilirubin 0.4 0.3 - 1.2 mg/dL   GFR calc non Af Amer >60 >60 mL/min   GFR calc Af Amer >60 >60 mL/min    Comment: (NOTE) The eGFR has been calculated using the CKD EPI equation. This calculation has not been validated in all clinical situations. eGFR's persistently <60 mL/min signify possible Chronic Kidney Disease.    Anion gap 9 5 - 15  Ethanol (ETOH)     Status: None   Collection Time: 04/03/15 11:22 AM  Result Value Ref Range   Alcohol, Ethyl (B) <5 <5 mg/dL    Comment:        LOWEST DETECTABLE LIMIT FOR SERUM ALCOHOL IS 5 mg/dL FOR MEDICAL PURPOSES ONLY   Salicylate level     Status: None   Collection Time: 04/03/15 11:22 AM  Result Value Ref Range   Salicylate Lvl <3.8 2.8 - 30.0 mg/dL  Acetaminophen level     Status: Abnormal   Collection Time: 04/03/15 11:22 AM  Result Value Ref Range   Acetaminophen (Tylenol), Serum <10 (L) 10 - 30 ug/mL    Comment:        THERAPEUTIC CONCENTRATIONS VARY SIGNIFICANTLY. A RANGE OF 10-30 ug/mL MAY BE AN EFFECTIVE CONCENTRATION FOR MANY PATIENTS. HOWEVER, SOME ARE BEST TREATED AT CONCENTRATIONS OUTSIDE THIS RANGE. ACETAMINOPHEN CONCENTRATIONS >150 ug/mL AT 4 HOURS AFTER INGESTION AND >50 ug/mL AT 12 HOURS AFTER INGESTION ARE OFTEN ASSOCIATED WITH TOXIC REACTIONS.   CBC     Status: None   Collection Time: 04/03/15 11:22 AM  Result Value Ref Range   WBC 8.0 4.0 - 10.5 K/uL   RBC 4.63 4.22 - 5.81 MIL/uL   Hemoglobin 14.2 13.0 - 17.0 g/dL   HCT 43.1 39.0 - 52.0 %   MCV 93.1 78.0 - 100.0 fL   MCH 30.7 26.0 - 34.0 pg   MCHC 32.9 30.0 - 36.0 g/dL   RDW 13.5 11.5 - 15.5 %   Platelets 261 150 - 400 K/uL  Troponin I     Status: None   Collection Time: 04/03/15 11:22 AM  Result Value Ref Range   Troponin I <0.03 <0.031 ng/mL    Comment:        NO INDICATION OF MYOCARDIAL INJURY.   Urine rapid drug screen (hosp  performed) (Not at [facility])     Status: Abnormal   Collection Time: 04/03/15  2:00 PM  Result Value Ref Range   Opiates NONE DETECTED NONE DETECTED   Cocaine POSITIVE (A) NONE DETECTED   Benzodiazepines POSITIVE (A) NONE DETECTED   Amphetamines NONE DETECTED NONE DETECTED   Tetrahydrocannabinol NONE DETECTED NONE DETECTED  Barbiturates NONE DETECTED NONE DETECTED    Comment:        DRUG SCREEN FOR MEDICAL PURPOSES ONLY.  IF CONFIRMATION IS NEEDED FOR ANY PURPOSE, NOTIFY LAB WITHIN 5 DAYS.        LOWEST DETECTABLE LIMITS FOR URINE DRUG SCREEN Drug Class       Cutoff (ng/mL) Amphetamine      1000 Barbiturate      200 Benzodiazepine   591 Tricyclics       638 Opiates          300 Cocaine          300 THC              50     Metabolic Disorder Labs:  No results found for: HGBA1C, MPG No results found for: PROLACTIN No results found for: CHOL, TRIG, HDL, CHOLHDL, VLDL, LDLCALC  Current Medications: Current Facility-Administered Medications  Medication Dose Route Frequency Provider Last Rate Last Dose  . acetaminophen (TYLENOL) tablet 650 mg  650 mg Oral Q6H PRN Laverle Hobby, PA-C      . alum & mag hydroxide-simeth (MAALOX/MYLANTA) 200-200-20 MG/5ML suspension 30 mL  30 mL Oral Q4H PRN Laverle Hobby, PA-C      . amLODipine (NORVASC) tablet 10 mg  10 mg Oral Daily Laverle Hobby, PA-C   10 mg at 04/04/15 0739  . atenolol (TENORMIN) tablet 50 mg  50 mg Oral Daily Laverle Hobby, PA-C   50 mg at 04/04/15 4665  . chlordiazePOXIDE (LIBRIUM) capsule 25 mg  25 mg Oral Q6H PRN Laverle Hobby, PA-C   25 mg at 04/04/15 0301  . chlordiazePOXIDE (LIBRIUM) capsule 25 mg  25 mg Oral QID Laverle Hobby, PA-C   25 mg at 04/04/15 9935   Followed by  . [START ON 04/05/2015] chlordiazePOXIDE (LIBRIUM) capsule 25 mg  25 mg Oral TID Laverle Hobby, PA-C       Followed by  . [START ON 04/06/2015] chlordiazePOXIDE (LIBRIUM) capsule 25 mg  25 mg Oral BH-qamhs Spencer E Simon, PA-C        Followed by  . [START ON 04/07/2015] chlordiazePOXIDE (LIBRIUM) capsule 25 mg  25 mg Oral Daily Laverle Hobby, PA-C      . gabapentin (NEURONTIN) capsule 100 mg  100 mg Oral QID Laverle Hobby, PA-C   100 mg at 04/04/15 7017  . hydrOXYzine (ATARAX/VISTARIL) tablet 25 mg  25 mg Oral Q6H PRN Laverle Hobby, PA-C      . loperamide (IMODIUM) capsule 2-4 mg  2-4 mg Oral PRN Laverle Hobby, PA-C      . magnesium hydroxide (MILK OF MAGNESIA) suspension 30 mL  30 mL Oral Daily PRN Laverle Hobby, PA-C      . multivitamin with minerals tablet 1 tablet  1 tablet Oral Daily Laverle Hobby, PA-C   1 tablet at 04/04/15 [id number]  . phenytoin (DILANTIN) ER capsule 300 mg  300 mg Oral QHS Laverle Hobby, PA-C   300 mg at 04/04/15 0045  . [START ON 04/05/2015] thiamine (VITAMIN B-1) tablet 100 mg  100 mg Oral Daily Laverle Hobby, PA-C      . traZODone (DESYREL) tablet 50 mg  50 mg Oral QHS,Barry X 1 Spencer E Simon, PA-C   50 mg at 04/04/15 0301   PTA Medications: Prescriptions prior to admission  Medication Sig Dispense Refill Last Dose  . amLODipine (NORVASC) 10 MG tablet Take 10 mg by  mouth daily.   04/03/2015 at 1000  . atenolol (TENORMIN) 50 MG tablet Take 50 mg by mouth daily.   04/03/2015 at Unknown time  . gabapentin (NEURONTIN) 100 MG capsule Take 100 mg by mouth 4 (four) times daily.  0 04/03/2015 at Unknown time  . hydrOXYzine (VISTARIL) 100 MG capsule Take 100 mg by mouth 4 (four) times daily as needed for itching or anxiety.   04/03/2015 at Unknown time  . phenytoin (DILANTIN) 100 MG ER capsule Take 3 capsules (300 mg total) by mouth at bedtime. 60 capsule 0 Past Week at Unknown time    Musculoskeletal: Strength & Muscle Tone: within normal limits Gait & Station: normal Patient leans: N/A  Psychiatric Specialty Exam: Physical Exam  Nursing note and vitals reviewed. Constitutional: He is oriented to person, place, and time. He appears well-developed and well-nourished. No distress.  HENT:   Head: Normocephalic.  Neurological: He is alert and oriented to person, place, and time. No cranial nerve deficit.  Skin: Skin is warm and dry. He is not diaphoretic.  Psychiatric:  See Psych Assessment     Review of Systems  Respiratory: Positive for shortness of breath.   Cardiovascular: Positive for chest pain.  Psychiatric/Behavioral: Positive for depression, suicidal ideas and substance abuse. The patient is nervous/anxious and has insomnia.   All other systems reviewed and are negative.   Blood pressure 111/69, pulse 72, temperature 98.1 F (36.7 C), temperature source Oral, resp. rate 18, height _0  (1.803 m), weight 104.327 kg (230 lb), SpO2 76 %.Body mass index is 32.09 kg/(m^2).  General Appearance: Casual and Fairly Groomed  Eye Contact::  Good  Speech:  Clear and Coherent and Normal Rate  Volume:  Normal  Mood:  Anxious and Depressed  Affect:  Congruent  Thought Process:  Coherent, Goal Directed, Linear and Logical  Orientation:  Full (Time, Place, and Person)  Thought Content:  Negative  Suicidal Thoughts:  No  Homicidal Thoughts:  No  Memory:  Immediate;   Good  Judgement:  Impaired  Insight:  Lacking  Psychomotor Activity:  Normal  Concentration:  Fair  Recall:  [employer] of Knowledge:Fair  Language: Fair  Akathisia:  Negative  Handed:  Right  AIMS (if indicated):     Assets:  Desire for Improvement  ADL's:  Intact  Cognition: WNL  Sleep:      Treatment Plan Summary:  -Discharge home to shelter -14 day scripts -SW to help with resources for [facility]/bus pass, etc.  Withrow, John C, FNP-BC 12/20/201611:41 AM

## 2015-04-05 ENCOUNTER — Emergency Department (HOSPITAL_BASED_OUTPATIENT_CLINIC_OR_DEPARTMENT_OTHER)
Admission: EM | Admit: 2015-04-05 | Discharge: 2015-04-06 | Disposition: A | Discharge status: Home / Self Care | Payer: Self-pay | Attending: Emergency Medicine | Admitting: Emergency Medicine

## 2015-04-05 ENCOUNTER — Encounter (HOSPITAL_BASED_OUTPATIENT_CLINIC_OR_DEPARTMENT_OTHER): Payer: Self-pay | Admitting: Emergency Medicine

## 2015-04-05 DIAGNOSIS — J029 Acute pharyngitis, unspecified: Secondary | ICD-10-CM | POA: Insufficient documentation

## 2015-04-05 DIAGNOSIS — R1084 Generalized abdominal pain: Secondary | ICD-10-CM | POA: Insufficient documentation

## 2015-04-05 DIAGNOSIS — R109 Unspecified abdominal pain: Secondary | ICD-10-CM

## 2015-04-05 DIAGNOSIS — F17210 Nicotine dependence, cigarettes, uncomplicated: Secondary | ICD-10-CM | POA: Insufficient documentation

## 2015-04-05 DIAGNOSIS — Z9049 Acquired absence of other specified parts of digestive tract: Secondary | ICD-10-CM | POA: Insufficient documentation

## 2015-04-05 DIAGNOSIS — Z79899 Other long term (current) drug therapy: Secondary | ICD-10-CM | POA: Insufficient documentation

## 2015-04-05 LAB — CBC WITH DIFFERENTIAL/PLATELET
Basophils Absolute: 0.1 10*3/uL (ref 0.0–0.1)
Basophils Relative: 1 %
EOS PCT: 2 %
Eosinophils Absolute: 0.2 10*3/uL (ref 0.0–0.7)
HEMATOCRIT: 41.7 % (ref 39.0–52.0)
Hemoglobin: 13.9 g/dL (ref 13.0–17.0)
LYMPHS ABS: 2.6 10*3/uL (ref 0.7–4.0)
LYMPHS PCT: 25 %
MCH: 30.8 pg (ref 26.0–34.0)
MCHC: 33.3 g/dL (ref 30.0–36.0)
MCV: 92.3 fL (ref 78.0–100.0)
MONO ABS: 0.8 10*3/uL (ref 0.1–1.0)
Monocytes Relative: 7 %
NEUTROS ABS: 6.8 10*3/uL (ref 1.7–7.7)
Neutrophils Relative %: 65 %
Platelets: 274 10*3/uL (ref 150–400)
RBC: 4.52 MIL/uL (ref 4.22–5.81)
RDW: 13.1 % (ref 11.5–15.5)
WBC: 10.3 10*3/uL (ref 4.0–10.5)

## 2015-04-05 LAB — RAPID STREP SCREEN (MED CTR MEBANE ONLY): Streptococcus, Group A Screen (Direct): NEGATIVE

## 2015-04-05 LAB — URINALYSIS, ROUTINE W REFLEX MICROSCOPIC
Bilirubin Urine: NEGATIVE
Glucose, UA: NEGATIVE mg/dL
Hgb urine dipstick: NEGATIVE
Ketones, ur: NEGATIVE mg/dL
Leukocytes, UA: NEGATIVE
Nitrite: NEGATIVE
Protein, ur: NEGATIVE mg/dL
Specific Gravity, Urine: 1.021 (ref 1.005–1.030)
pH: 6 (ref 5.0–8.0)

## 2015-04-05 MED ORDER — ONDANSETRON 8 MG PO TBDP
8.0000 mg | ORAL_TABLET | Freq: Once | ORAL | Status: AC
  Administered 2015-04-05: 8 mg via ORAL
  Filled 2015-04-05: qty 1

## 2015-04-05 NOTE — ED Notes (Signed)
Patient states that he is having paint his upper and lower abdominal area with sore throat and ear pain

## 2015-04-05 NOTE — ED Provider Notes (Addendum)
CSN: [id number]     Arrival date & time 04/05/15  2223 History   By signing my name below, I, Terrance Branch, attest that this documentation has been prepared under the direction and in the presence of Paula Libra, MD. Electronically Signed: Evon Slack, ED Scribe. 04/06/2015. 12:35 AM.    Chief Complaint  Patient presents with  . Abdominal Pain   Patient is a 44 y.o. male presenting with abdominal pain. The history is provided by the patient. No language interpreter was used.  Abdominal Pain  HPI Comments: Barry Daugherty is a 44 y.o. male who presents to the Emergency Department complaining of sharp-cramping diffuse abdominal pain onset 1 week prior. Pt states that the pain radiates around to his back. Pt reports associated diarrhea and nausea but no vomiting or fever. He states that the pain is worse with movement. Pt is also complaining of sore throat that began 2 days prior. He states that the pain is radiating to his ears. Symptoms are moderate.  A review of the patient's chart shows that he was seen 2 days ago for anxiety and chest pain. He made no mention of any of the above complaints at that visit. He was assessed by behavioral health and was given a two-week supply of his medications. It was noted that he is homeless but arrangements had been made for him to stay in a shelter.  Past Medical History  Diagnosis Date  . Seizures [facility])    Past Surgical History  Procedure Laterality Date  . Nephrectomy    . Cholecystectomy     History reviewed. No pertinent family history. Social History  Substance Use Topics  . Smoking status: Current Some Day Smoker    Types: Cigarettes  . Smokeless tobacco: None  . Alcohol Use: No    Review of Systems  Gastrointestinal: Positive for abdominal pain.  A complete 10 system review of systems was obtained and all systems are negative except as noted in the HPI and PMH.     Allergies  Ceftin and Reglan  Home Medications   Prior to  Admission medications   Medication Sig Start Date End Date Taking? Authorizing Provider  amLODipine (NORVASC) 10 MG tablet Take 1 tablet (10 mg total) by mouth daily. 04/04/15   Beau Fanny, FNP  atenolol (TENORMIN) 50 MG tablet Take 1 tablet (50 mg total) by mouth daily. 04/04/15   Beau Fanny, FNP  gabapentin (NEURONTIN) 100 MG capsule Take 1 capsule (100 mg total) by mouth 4 (four) times daily. 04/04/15   Beau Fanny, FNP  hydrOXYzine (ATARAX/VISTARIL) 50 MG tablet Take 1 tablet (50 mg total) by mouth every 6 (six) hours as needed for anxiety. 04/04/15   Beau Fanny, FNP  phenytoin (DILANTIN) 300 MG ER capsule Take 1 capsule (300 mg total) by mouth at bedtime. 04/04/15   Beau Fanny, FNP  traZODone (DESYREL) 50 MG tablet Take 1 tablet (50 mg total) by mouth at bedtime as needed for sleep. 04/04/15   Everardo All Withrow, FNP   BP 133/95 mmHg  Pulse 96  Temp(Src) 98.8 F (37.1 C) (Oral)  Resp 16  SpO2 95%   Physical Exam  General: Well-developed, well-nourished male in no acute distress; appearance consistent with age of record HENT: normocephalic; atraumatic; pharynx normal; no dysphonia; no trismus; 1TMs normal Eyes: pupils equal, round and reactive to light; extraocular muscles intact Neck: supple Heart: regular rate and rhythm Lungs: clear to auscultation bilaterally Abdomen: soft; nondistended; diffusely tender;  no masses or hepatosplenomegaly; bowel sounds present Extremities: No deformity; full range of motion; pulses normal Neurologic: Awake, alert and oriented; motor function intact in all extremities and symmetric; no facial droop Skin: Warm and dry Psychiatric: Normal mood and affect  ED Course  Procedures (including critical care time) DIAGNOSTIC STUDIES: Oxygen Saturation is 95% on RA, adequate by my interpretation.    COORDINATION OF CARE: 11:33 PM-Discussed treatment plan with pt at bedside and pt agreed to plan.     MDM  Nursing notes and vitals  signs, including pulse oximetry, reviewed.  Summary of this visit's results, reviewed by myself:  Labs:  Results for orders placed or performed during the hospital encounter of 04/05/15 (from the past 24 hour(s))  Comprehensive metabolic panel     Status: Abnormal   Collection Time: 04/05/15 11:45 PM  Result Value Ref Range   Sodium 140 135 - 145 mmol/L   Potassium 3.7 3.5 - 5.1 mmol/L   Chloride 108 101 - 111 mmol/L   CO2 25 22 - 32 mmol/L   Glucose, Bld 119 (H) 65 - 99 mg/dL   BUN 14 6 - 20 mg/dL   Creatinine, Ser 1.61 0.61 - 1.24 mg/dL   Calcium 8.9 8.9 - 09.6 mg/dL   Total Protein 6.6 6.5 - 8.1 g/dL   Albumin 3.5 3.5 - 5.0 g/dL   AST 28 15 - 41 U/L   ALT 31 17 - 63 U/L   Alkaline Phosphatase 71 38 - 126 U/L   Total Bilirubin 0.4 0.3 - 1.2 mg/dL   GFR calc non Af Amer >60 >60 mL/min   GFR calc Af Amer >60 >60 mL/min   Anion gap 7 5 - 15  Lipase, blood     Status: None   Collection Time: 04/05/15 11:45 PM  Result Value Ref Range   Lipase 34 11 - 51 U/L  CBC with Differential/Platelet     Status: None   Collection Time: 04/05/15 11:45 PM  Result Value Ref Range   WBC 10.3 4.0 - 10.5 K/uL   RBC 4.52 4.22 - 5.81 MIL/uL   Hemoglobin 13.9 13.0 - 17.0 g/dL   HCT 04.5 40.9 - 81.1 %   MCV 92.3 78.0 - 100.0 fL   MCH 30.8 26.0 - 34.0 pg   MCHC 33.3 30.0 - 36.0 g/dL   RDW 91.4 78.2 - 95.6 %   Platelets 274 150 - 400 K/uL   Neutrophils Relative % 65 %   Neutro Abs 6.8 1.7 - 7.7 K/uL   Lymphocytes Relative 25 %   Lymphs Abs 2.6 0.7 - 4.0 K/uL   Monocytes Relative 7 %   Monocytes Absolute 0.8 0.1 - 1.0 K/uL   Eosinophils Relative 2 %   Eosinophils Absolute 0.2 0.0 - 0.7 K/uL   Basophils Relative 1 %   Basophils Absolute 0.1 0.0 - 0.1 K/uL  Rapid strep screen     Status: None   Collection Time: 04/05/15 11:45 PM  Result Value Ref Range   Streptococcus, Group A Screen (Direct) NEGATIVE NEGATIVE  Urinalysis, Routine w reflex microscopic (not at [facility])     Status: None    Collection Time: 04/05/15 11:45 PM  Result Value Ref Range   Color, Urine YELLOW YELLOW   APPearance CLEAR CLEAR   Specific Gravity, Urine 1.021 1.005 - 1.030   pH 6.0 5.0 - 8.0   Glucose, UA NEGATIVE NEGATIVE mg/dL   Hgb urine dipstick NEGATIVE NEGATIVE   Bilirubin Urine NEGATIVE NEGATIVE  Ketones, ur NEGATIVE NEGATIVE mg/dL   Protein, ur NEGATIVE NEGATIVE mg/dL   Nitrite NEGATIVE NEGATIVE   Leukocytes, UA NEGATIVE NEGATIVE   12:35 AM No evidence of serious illness was found on exam or laboratory work. The symptoms may be due to a viral illness. It is also noted that he made no mention of these symptoms 2 days ago.  I personally performed the services described in this documentation, which was scribed in my presence. The recorded information has been reviewed and is accurate.    Paula Libra, MD 04/06/15 0141  Paula Libra, MD 04/06/15 [phone]

## 2015-04-06 LAB — COMPREHENSIVE METABOLIC PANEL
ALK PHOS: 71 U/L (ref 38–126)
ALT: 31 U/L (ref 17–63)
AST: 28 U/L (ref 15–41)
Albumin: 3.5 g/dL (ref 3.5–5.0)
Anion gap: 7 (ref 5–15)
BUN: 14 mg/dL (ref 6–20)
CALCIUM: 8.9 mg/dL (ref 8.9–10.3)
CHLORIDE: 108 mmol/L (ref 101–111)
CO2: 25 mmol/L (ref 22–32)
CREATININE: 1.09 mg/dL (ref 0.61–1.24)
Glucose, Bld: 119 mg/dL — ABNORMAL HIGH (ref 65–99)
Potassium: 3.7 mmol/L (ref 3.5–5.1)
SODIUM: 140 mmol/L (ref 135–145)
Total Bilirubin: 0.4 mg/dL (ref 0.3–1.2)
Total Protein: 6.6 g/dL (ref 6.5–8.1)

## 2015-04-06 LAB — LIPASE, BLOOD: Lipase: 34 U/L (ref 11–51)

## 2015-04-07 ENCOUNTER — Encounter (HOSPITAL_BASED_OUTPATIENT_CLINIC_OR_DEPARTMENT_OTHER): Payer: Self-pay | Admitting: *Deleted

## 2015-04-07 ENCOUNTER — Emergency Department (HOSPITAL_BASED_OUTPATIENT_CLINIC_OR_DEPARTMENT_OTHER)
Admission: EM | Admit: 2015-04-07 | Discharge: 2015-04-08 | Disposition: A | Discharge status: Home / Self Care | Payer: Self-pay | Attending: Emergency Medicine | Admitting: Emergency Medicine

## 2015-04-07 DIAGNOSIS — F17210 Nicotine dependence, cigarettes, uncomplicated: Secondary | ICD-10-CM | POA: Insufficient documentation

## 2015-04-07 DIAGNOSIS — Z79899 Other long term (current) drug therapy: Secondary | ICD-10-CM | POA: Insufficient documentation

## 2015-04-07 DIAGNOSIS — R0789 Other chest pain: Secondary | ICD-10-CM | POA: Insufficient documentation

## 2015-04-07 DIAGNOSIS — R42 Dizziness and giddiness: Secondary | ICD-10-CM | POA: Insufficient documentation

## 2015-04-07 NOTE — ED Notes (Signed)
Pt is snoring and sound asleep

## 2015-04-07 NOTE — ED Notes (Signed)
Pt is from [facility] , pt c/o dizziness and sore throat. Seen here x 2 days ago for same

## 2015-04-08 ENCOUNTER — Other Ambulatory Visit: Payer: Self-pay

## 2015-04-08 ENCOUNTER — Emergency Department (HOSPITAL_BASED_OUTPATIENT_CLINIC_OR_DEPARTMENT_OTHER): Payer: Self-pay

## 2015-04-08 LAB — CBC WITH DIFFERENTIAL/PLATELET
BASOS ABS: 0.1 10*3/uL (ref 0.0–0.1)
Basophils Relative: 1 %
Eosinophils Absolute: 0.3 10*3/uL (ref 0.0–0.7)
Eosinophils Relative: 3 %
HEMATOCRIT: 41 % (ref 39.0–52.0)
Hemoglobin: 13.6 g/dL (ref 13.0–17.0)
LYMPHS ABS: 2.7 10*3/uL (ref 0.7–4.0)
LYMPHS PCT: 29 %
MCH: 30.7 pg (ref 26.0–34.0)
MCHC: 33.2 g/dL (ref 30.0–36.0)
MCV: 92.6 fL (ref 78.0–100.0)
MONO ABS: 0.9 10*3/uL (ref 0.1–1.0)
Monocytes Relative: 9 %
NEUTROS ABS: 5.3 10*3/uL (ref 1.7–7.7)
Neutrophils Relative %: 58 %
Platelets: 284 10*3/uL (ref 150–400)
RBC: 4.43 MIL/uL (ref 4.22–5.81)
RDW: 13 % (ref 11.5–15.5)
WBC: 9.2 10*3/uL (ref 4.0–10.5)

## 2015-04-08 LAB — LIPASE, BLOOD: Lipase: 36 U/L (ref 11–51)

## 2015-04-08 LAB — COMPREHENSIVE METABOLIC PANEL
ALBUMIN: 3.5 g/dL (ref 3.5–5.0)
ALK PHOS: 69 U/L (ref 38–126)
ALT: 33 U/L (ref 17–63)
ANION GAP: 6 (ref 5–15)
AST: 32 U/L (ref 15–41)
BUN: 14 mg/dL (ref 6–20)
CALCIUM: 8.5 mg/dL — AB (ref 8.9–10.3)
CO2: 25 mmol/L (ref 22–32)
Chloride: 108 mmol/L (ref 101–111)
Creatinine, Ser: 1.13 mg/dL (ref 0.61–1.24)
GFR calc Af Amer: >60 mL/min (ref >60)
GFR calc non Af Amer: >60 mL/min (ref >60)
GLUCOSE: 128 mg/dL — AB (ref 65–99)
Potassium: 3.9 mmol/L (ref 3.5–5.1)
SODIUM: 139 mmol/L (ref 135–145)
Total Bilirubin: 0.5 mg/dL (ref 0.3–1.2)
Total Protein: 6.4 g/dL — ABNORMAL LOW (ref 6.5–8.1)

## 2015-04-08 LAB — TROPONIN I

## 2015-04-08 LAB — CULTURE, GROUP A STREP: Strep A Culture: NEGATIVE

## 2015-04-08 NOTE — ED Provider Notes (Signed)
CSN: [id number]     Arrival date & time 04/07/15  2301 History   First MD Initiated Contact with Patient 04/07/15 2344     Chief Complaint  Patient presents with  . Dizziness     (Consider location/radiation/quality/duration/timing/severity/associated sxs/prior Treatment) HPI  This is a 44 year old male with a history of seizures, polysubstance abuse who presents with complaints of dizziness and chest pain. He has been seen multiple times since December 18. He has had a chest pain workup. He has additionally been evaluated by behavioral health. He was seen and evaluated 2 days ago for abdominal pain. At that time workup was negative. He is currently at [facility].  Patient reports onset of sharp left-sided chest pain prior to arrival. Denies shortness of breath, fever, or cough.  No known heart history. Reports history of diabetes, hypertension and early family history of heart disease. He is not a smoker. Denies worsening pain with breathing.  States the pain is worse when he swallows. History of GERD. Denies any association with food. Also reports dizziness. Denies room spinning dizziness.  Past Medical History  Diagnosis Date  . Seizures [facility])    Past Surgical History  Procedure Laterality Date  . Nephrectomy    . Cholecystectomy     History reviewed. No pertinent family history. Social History  Substance Use Topics  . Smoking status: Current Some Day Smoker    Types: Cigarettes  . Smokeless tobacco: None  . Alcohol Use: No    Review of Systems  Constitutional: Negative.  Negative for fever.  Respiratory: Negative.  Negative for cough, chest tightness and shortness of breath.   Cardiovascular: Positive for chest pain. Negative for leg swelling.  Gastrointestinal: Negative.  Negative for nausea, vomiting and abdominal pain.  Genitourinary: Negative.  Negative for dysuria.  Musculoskeletal: Negative for back pain.  Skin: Negative for rash.  Neurological: Positive for dizziness.  Negative for weakness, numbness and headaches.  Psychiatric/Behavioral: Negative for suicidal ideas.  All other systems reviewed and are negative.     Allergies  Ceftin and Reglan  Home Medications   Prior to Admission medications   Medication Sig Start Date End Date Taking? Authorizing Provider  amLODipine (NORVASC) 10 MG tablet Take 1 tablet (10 mg total) by mouth daily. 04/04/15   Beau Fanny, FNP  atenolol (TENORMIN) 50 MG tablet Take 1 tablet (50 mg total) by mouth daily. 04/04/15   Beau Fanny, FNP  gabapentin (NEURONTIN) 100 MG capsule Take 1 capsule (100 mg total) by mouth 4 (four) times daily. 04/04/15   Beau Fanny, FNP  hydrOXYzine (ATARAX/VISTARIL) 50 MG tablet Take 1 tablet (50 mg total) by mouth every 6 (six) hours as needed for anxiety. 04/04/15   Beau Fanny, FNP  phenytoin (DILANTIN) 300 MG ER capsule Take 1 capsule (300 mg total) by mouth at bedtime. 04/04/15   Beau Fanny, FNP  traZODone (DESYREL) 50 MG tablet Take 1 tablet (50 mg total) by mouth at bedtime as needed for sleep. 04/04/15   Everardo All Withrow, FNP   BP 130/82 mmHg  Pulse 85  Temp(Src) 97.7 F (36.5 C) (Oral)  Resp 18  Wt 230 lb (104.327 kg)  SpO2 96% Physical Exam  Constitutional: He is oriented to person, place, and time. He appears well-developed and well-nourished.  Resting comfortably  HENT:  Head: Normocephalic and atraumatic.  Mouth/Throat: Oropharynx is clear and moist. No oropharyngeal exudate.  Eyes: Pupils are equal, round, and reactive to light.  Cardiovascular: Normal rate,  regular rhythm and normal heart sounds.   No murmur heard. Pulmonary/Chest: Effort normal and breath sounds normal. No respiratory distress. He has no wheezes. He exhibits tenderness.  Abdominal: Soft. Bowel sounds are normal. There is no tenderness. There is no rebound.  Musculoskeletal: He exhibits no edema.  Neurological: He is alert and oriented to person, place, and time.  Skin: Skin is warm and  dry.  Psychiatric: He has a normal mood and affect.  Nursing note and vitals reviewed.   ED Course  Procedures (including critical care time) Labs Review Labs Reviewed  COMPREHENSIVE METABOLIC PANEL - Abnormal; Notable for the following:    Glucose, Bld 128 (*)    Calcium 8.5 (*)    Total Protein 6.4 (*)    All other components within normal limits  TROPONIN I  CBC WITH DIFFERENTIAL/PLATELET  LIPASE, BLOOD    Imaging Review Dg Abd Acute W/chest  04/08/2015  CLINICAL DATA:  44 year old male with chest pain EXAM: DG ABDOMEN ACUTE W/ 1V CHEST COMPARISON:  Radiograph dated 04/01/2014 FINDINGS: The lungs are clear. No pleural effusion or pneumothorax. The cardiac silhouette is within normal limits. Dense stool noted throughout the colon compatible with constipation. No evidence of bowel obstruction. Right upper quadrant cholecystectomy as well as surgical clips over the left mid abdomen noted. No radiopaque calculi identified. There is degenerative changes of the spine. No acute fracture. IMPRESSION: No acute intrathoracic pathology. Constipation.  No evidence of bowel obstruction. Electronically Signed   By: Elgie Collard M.D.   On: 04/08/2015 01:28   I have personally reviewed and evaluated these images and lab results as part of my medical decision-making.   EKG Interpretation   Date/Time:  Saturday April 08 2015 00:53:22 EST Ventricular Rate:  74 PR Interval:  168 QRS Duration: 80 QT Interval:  396 QTC Calculation: 439 R Axis:   63 Text Interpretation:  Normal sinus rhythm Normal ECG Confirmed by Tavarion Babington   MD, Kimala Horne ([id number]) on 04/08/2015 1:48:54 AM      MDM   Final diagnoses:  Dizziness  Other chest pain    Patient presents for chest pain and dizziness. Poor social situation and multiple recent workups for chest pain, sore throat, abdominal pain, and suicidal ideation. Currently at [facility].  Nontoxic on exam. Does have reproducible tenderness on exam. EKG is  nonischemic. Troponin is negative and basic labwork is reassuring. Acute abdominal series is also reassuring. Suspect musculoskeletal pain versus GERD given symptoms related to swallowing. Oropharyngeal exam is reassuring. Of note, nursing noted the patient was on 300 mg of trazodone daily at bedtime which the patient states he is not tolerating and "I'm a zombie." I reviewed his meds from discharge from behavioral health. It appears he was on 50 mg daily at bedtime at bedtime.  This was noted in his discharge for [facility].  After history, exam, and medical workup I feel the patient has been appropriately medically screened and is safe for discharge home. Pertinent diagnoses were discussed with the patient. Patient was given return precautions.     Shon Baton, MD 04/08/15 [phone]

## 2015-04-08 NOTE — Discharge Instructions (Signed)
Nonspecific Chest Pain  °Chest pain can be caused by many different conditions. There is always a chance that your pain could be related to something serious, such as a heart attack or a blood clot in your lungs. Chest pain can also be caused by conditions that are not life-threatening. If you have chest pain, it is very important to follow up with your health care provider. °CAUSES  °Chest pain can be caused by: °· Heartburn. °· Pneumonia or bronchitis. °· Anxiety or stress. °· Inflammation around your heart (pericarditis) or lung (pleuritis or pleurisy). °· A blood clot in your lung. °· A collapsed lung (pneumothorax). It can develop suddenly on its own (spontaneous pneumothorax) or from trauma to the chest. °· Shingles infection (varicella-zoster virus). °· Heart attack. °· Damage to the bones, muscles, and cartilage that make up your chest wall. This can include: °¨ Bruised bones due to injury. °¨ Strained muscles or cartilage due to frequent or repeated coughing or overwork. °¨ Fracture to one or more ribs. °¨ Sore cartilage due to inflammation (costochondritis). °RISK FACTORS  °Risk factors for chest pain may include: °· Activities that increase your risk for trauma or injury to your chest. °· Respiratory infections or conditions that cause frequent coughing. °· Medical conditions or overeating that can cause heartburn. °· Heart disease or family history of heart disease. °· Conditions or health behaviors that increase your risk of developing a blood clot. °· Having had chicken pox (varicella zoster). °SIGNS AND SYMPTOMS °Chest pain can feel like: °· Burning or tingling on the surface of your chest or deep in your chest. °· Crushing, pressure, aching, or squeezing pain. °· Dull or sharp pain that is worse when you move, cough, or take a deep breath. °· Pain that is also felt in your back, neck, shoulder, or arm, or pain that spreads to any of these areas. °Your chest pain may come and go, or it may stay  constant. °DIAGNOSIS °Lab tests or other studies may be needed to find the cause of your pain. Your health care provider may have you take a test called an ambulatory ECG (electrocardiogram). An ECG records your heartbeat patterns at the time the test is performed. You may also have other tests, such as: °· Transthoracic echocardiogram (TTE). During echocardiography, sound waves are used to create a picture of all of the heart structures and to look at how blood flows through your heart. °· Transesophageal echocardiogram (TEE). This is a more advanced imaging test that obtains images from inside your body. It allows your health care provider to see your heart in finer detail. °· Cardiac monitoring. This allows your health care provider to monitor your heart rate and rhythm in real time. °· Holter monitor. This is a portable device that records your heartbeat and can help to diagnose abnormal heartbeats. It allows your health care provider to track your heart activity for several days, if needed. °· Stress tests. These can be done through exercise or by taking medicine that makes your heart beat more quickly. °· Blood tests. °· Imaging tests. °TREATMENT  °Your treatment depends on what is causing your chest pain. Treatment may include: °· Medicines. These may include: °¨ Acid blockers for heartburn. °¨ Anti-inflammatory medicine. °¨ Pain medicine for inflammatory conditions. °¨ Antibiotic medicine, if an infection is present. °¨ Medicines to dissolve blood clots. °¨ Medicines to treat coronary artery disease. °· Supportive care for conditions that do not require medicines. This may include: °¨ Resting. °¨ Applying heat   or cold packs to injured areas.  Limiting activities until pain decreases. HOME CARE INSTRUCTIONS  If you were prescribed an antibiotic medicine, finish it all even if you start to feel better.  Avoid any activities that bring on chest pain.  Do not use any tobacco products, including  cigarettes, chewing tobacco, or electronic cigarettes. If you need help quitting, ask your health care provider.  Do not drink alcohol.  Take medicines only as directed by your health care provider.  Keep all follow-up visits as directed by your health care provider. This is important. This includes any further testing if your chest pain does not go away.  If heartburn is the cause for your chest pain, you may be told to keep your head raised (elevated) while sleeping. This reduces the chance that acid will go from your stomach into your esophagus.  Make lifestyle changes as directed by your health care provider. These may include:  Getting regular exercise. Ask your health care provider to suggest some activities that are safe for you.  Eating a heart-healthy diet. A registered dietitian can help you to learn healthy eating options.  Maintaining a healthy weight.  Managing diabetes, if necessary.  Reducing stress. SEEK MEDICAL CARE IF:  Your chest pain does not go away after treatment.  You have a rash with blisters on your chest.  You have a fever. SEEK IMMEDIATE MEDICAL CARE IF:   Your chest pain is worse.  You have an increasing cough, or you cough up blood.  You have severe abdominal pain.  You have severe weakness.  You faint.  You have chills.  You have sudden, unexplained chest discomfort.  You have sudden, unexplained discomfort in your arms, back, neck, or jaw.  You have shortness of breath at any time.  You suddenly start to sweat, or your skin gets clammy.  You feel nauseous or you vomit.  You suddenly feel light-headed or dizzy.  Your heart begins to beat quickly, or it feels like it is skipping beats. These symptoms may represent a serious problem that is an emergency. Do not wait to see if the symptoms will go away. Get medical help right away. Call your local emergency services (911 in the [place] Do not drive yourself to the hospital.   This  information is not intended to replace advice given to you by your health care provider. Make sure you discuss any questions you have with your health care provider.   Document Released: 01/09/2005 Document Revised: 04/22/2014 Document Reviewed: 11/05/2013 Elsevier Interactive Patient Education 2016 Elsevier Inc. Dizziness Dizziness is a common problem. It is a feeling of unsteadiness or light-headedness. You may feel like you are about to faint. Dizziness can lead to injury if you stumble or fall. Anyone can become dizzy, but dizziness is more common in older adults. This condition can be caused by a number of things, including medicines, dehydration, or illness. HOME CARE INSTRUCTIONS Taking these steps may help with your condition: Eating and Drinking  Drink enough fluid to keep your urine clear or pale yellow. This helps to keep you from becoming dehydrated. Try to drink more clear fluids, such as water.  Do not drink alcohol.  Limit your caffeine intake if directed by your health care provider.  Limit your salt intake if directed by your health care provider. Activity  Avoid making quick movements.  Rise slowly from chairs and steady yourself until you feel okay.  In the morning, first sit up on the side  Taking these steps may help with your condition:  Eating and Drinking   Drink enough fluid to keep your urine clear or pale yellow. This helps to keep you from becoming dehydrated. Try to drink more clear fluids, such as water.   Do not drink alcohol.   Limit your caffeine intake if directed by your health care provider.   Limit your salt intake if directed by your health care provider.  Activity   Avoid making quick movements.    Rise slowly from chairs and steady yourself until you feel okay.    In the morning, first sit up on the side of the bed. When you feel okay, stand slowly while you hold onto something until you know that your balance is fine.   Move your legs often if you need to stand in one place for a long time. Tighten and relax your muscles in your legs while you are standing.   Do not drive or operate heavy machinery if you feel dizzy.   Avoid bending down if you feel dizzy. Place items in your home so that they are easy for you to reach without leaning over.  Lifestyle   Do not use any tobacco products, including cigarettes, chewing tobacco, or electronic cigarettes. If you need help quitting, ask your health care provider.   Try to reduce your stress level, such as with yoga or meditation. Talk  with your health care provider if you need help.  General Instructions   Watch your dizziness for any changes.   Take medicines only as directed by your health care provider. Talk with your health care provider if you think that your dizziness is caused by a medicine that you are taking.   Tell a friend or a family member that you are feeling dizzy. If he or she notices any changes in your behavior, have this person call your health care provider.   Keep all follow-up visits as directed by your health care provider. This is important.  SEEK MEDICAL CARE IF:   Your dizziness does not go away.   Your dizziness or light-headedness gets worse.   You feel nauseous.   You have reduced hearing.   You have new symptoms.   You are unsteady on your feet or you feel like the room is spinning.  SEEK IMMEDIATE MEDICAL CARE IF:   You vomit or have diarrhea and are unable to eat or drink anything.   You have problems talking, walking, swallowing, or using your arms, hands, or legs.   You feel generally weak.   You are not thinking clearly or you have trouble forming sentences. It may take a friend or family member to notice this.   You have chest pain, abdominal pain, shortness of breath, or sweating.   Your vision changes.   You notice any bleeding.   You have a headache.   You have neck pain or a stiff neck.   You have a fever.     This information is not intended to replace advice given to you by your health care provider. Make sure you discuss any questions you have with your health care provider.     Document Released: 09/25/2000 Document Revised: 08/16/2014 Document Reviewed: 03/28/2014  Elsevier Interactive Patient Education 2016 Elsevier Inc.

## 2015-04-11 ENCOUNTER — Encounter (HOSPITAL_BASED_OUTPATIENT_CLINIC_OR_DEPARTMENT_OTHER): Payer: Self-pay | Admitting: *Deleted

## 2015-04-11 ENCOUNTER — Inpatient Hospital Stay (HOSPITAL_COMMUNITY)
Admission: AD | Admit: 2015-04-11 | Discharge: 2015-04-20 | DRG: 885 | Disposition: A | Discharge status: Home / Self Care | Payer: Federal, State, Local not specified - Other | Source: Intra-hospital | Attending: Emergency Medicine | Admitting: Emergency Medicine

## 2015-04-11 ENCOUNTER — Encounter (HOSPITAL_COMMUNITY): Payer: Self-pay | Admitting: Emergency Medicine

## 2015-04-11 ENCOUNTER — Emergency Department (HOSPITAL_BASED_OUTPATIENT_CLINIC_OR_DEPARTMENT_OTHER)
Admission: EM | Admit: 2015-04-11 | Discharge: 2015-04-11 | Disposition: A | Discharge status: Other Acute Inpatient Hospital | Payer: Self-pay | Attending: Emergency Medicine | Admitting: Emergency Medicine

## 2015-04-11 DIAGNOSIS — F329 Major depressive disorder, single episode, unspecified: Secondary | ICD-10-CM | POA: Insufficient documentation

## 2015-04-11 DIAGNOSIS — G4700 Insomnia, unspecified: Secondary | ICD-10-CM | POA: Diagnosis present

## 2015-04-11 DIAGNOSIS — F332 Major depressive disorder, recurrent severe without psychotic features: Principal | ICD-10-CM | POA: Diagnosis present

## 2015-04-11 DIAGNOSIS — F17210 Nicotine dependence, cigarettes, uncomplicated: Secondary | ICD-10-CM | POA: Insufficient documentation

## 2015-04-11 DIAGNOSIS — R45851 Suicidal ideations: Secondary | ICD-10-CM | POA: Diagnosis present

## 2015-04-11 DIAGNOSIS — F410 Panic disorder [episodic paroxysmal anxiety] without agoraphobia: Secondary | ICD-10-CM | POA: Diagnosis present

## 2015-04-11 DIAGNOSIS — G40409 Other generalized epilepsy and epileptic syndromes, not intractable, without status epilepticus: Secondary | ICD-10-CM | POA: Diagnosis present

## 2015-04-11 DIAGNOSIS — R109 Unspecified abdominal pain: Secondary | ICD-10-CM

## 2015-04-11 DIAGNOSIS — F1310 Sedative, hypnotic or anxiolytic abuse, uncomplicated: Secondary | ICD-10-CM | POA: Insufficient documentation

## 2015-04-11 DIAGNOSIS — F411 Generalized anxiety disorder: Secondary | ICD-10-CM | POA: Diagnosis present

## 2015-04-11 DIAGNOSIS — F1510 Other stimulant abuse, uncomplicated: Secondary | ICD-10-CM | POA: Diagnosis present

## 2015-04-11 DIAGNOSIS — F32A Depression, unspecified: Secondary | ICD-10-CM

## 2015-04-11 DIAGNOSIS — R1084 Generalized abdominal pain: Secondary | ICD-10-CM | POA: Diagnosis not present

## 2015-04-11 DIAGNOSIS — F1410 Cocaine abuse, uncomplicated: Secondary | ICD-10-CM | POA: Diagnosis present

## 2015-04-11 DIAGNOSIS — F1910 Other psychoactive substance abuse, uncomplicated: Secondary | ICD-10-CM

## 2015-04-11 DIAGNOSIS — Z79899 Other long term (current) drug therapy: Secondary | ICD-10-CM | POA: Insufficient documentation

## 2015-04-11 DIAGNOSIS — Z590 Homelessness: Secondary | ICD-10-CM | POA: Diagnosis not present

## 2015-04-11 HISTORY — DX: Unspecified intracranial injury with loss of consciousness status unknown, initial encounter: S06.9XAA

## 2015-04-11 HISTORY — DX: Unspecified intracranial injury with loss of consciousness of unspecified duration, initial encounter: S06.9X9A

## 2015-04-11 LAB — ETHANOL: Alcohol, Ethyl (B): <5 mg/dL (ref <5)

## 2015-04-11 LAB — RAPID URINE DRUG SCREEN, HOSP PERFORMED
AMPHETAMINES: NOT DETECTED
Barbiturates: NOT DETECTED
Benzodiazepines: POSITIVE — AB
COCAINE: NOT DETECTED
OPIATES: NOT DETECTED
TETRAHYDROCANNABINOL: NOT DETECTED

## 2015-04-11 NOTE — Tx Team (Addendum)
Initial Interdisciplinary Treatment Plan   PATIENT STRESSORS: Financial difficulties Loss of parents and friends Substance abuse   PATIENT STRENGTHS: Ability for insight Capable of independent living Wellsite geologist fund of knowledge Motivation for treatment/growth   PROBLEM LIST: Problem List/Patient Goals Date to be addressed Date deferred Reason deferred Estimated date of resolution  "Depression" 04/11/15     "Anxiety" 04/11/15     "A place to live at discharge" 04/11/15     "Disability" 04/11/15     Risk for suicide                               DISCHARGE CRITERIA:  Adequate post-discharge living arrangements Improved stabilization in mood, thinking, and/or behavior Motivation to continue treatment in a less acute level of care Verbal commitment to aftercare and medication compliance  PRELIMINARY DISCHARGE PLAN: Attend aftercare/continuing care group Attend 12-step recovery group Outpatient therapy  PATIENT/FAMIILY INVOLVEMENT: This treatment plan has been presented to and reviewed with the patient, Barry Daugherty.  The patient and family have been given the opportunity to ask questions and make suggestions.  [name] 04/11/2015, 11:15 PM

## 2015-04-11 NOTE — ED Notes (Signed)
Pt states he does not remember when his last Tetanus vaccine was.

## 2015-04-11 NOTE — ED Notes (Signed)
Pt c/o dizziness x 6 days seen here x 4 days ago for same, pt is from [facility]

## 2015-04-11 NOTE — ED Provider Notes (Signed)
CSN: [id number]     Arrival date & time 04/11/15  1313 History   First MD Initiated Contact with Patient 04/11/15 1501     Chief Complaint  Patient presents with  . Suicidal     (Consider location/radiation/quality/duration/timing/severity/associated sxs/prior Treatment) HPI Patient reports he is getting help for his substance abuse at [facility]. He states however he just continues to feel sad and doesn't know what to do anymore. He reports that he has been feeling very anxious. He now reports he's had some suicidal thoughts for about the past 3 days. He reports these thoughts are getting more intense. He reports that he has thought about overdosing on his medications. He has not attempted anything to hurt or injure himself. He reports he got a lot of feedback from supportive people on his Facebook page and it makes him feel even worse about himself although he is happy that others are concerned for him. He states all the time he just feels like he is dizzy and doesn't feel good. Last substance abuse 9 days ago with cocaine. Last Crystal methamphetamine use 2 months ago. Past Medical History  Diagnosis Date  . Seizures [facility])    Past Surgical History  Procedure Laterality Date  . Nephrectomy    . Cholecystectomy     History reviewed. No pertinent family history. Social History  Substance Use Topics  . Smoking status: Current Some Day Smoker    Types: Cigarettes  . Smokeless tobacco: None  . Alcohol Use: No     Comment: [facility]    Review of Systems 10 Systems reviewed and are negative for acute change except as noted in the HPI.    Allergies  Ceftin and Reglan  Home Medications   Prior to Admission medications   Medication Sig Start Date End Date Taking? Authorizing Provider  pantoprazole (PROTONIX) 40 MG tablet Take 40 mg by mouth daily.   Yes Historical Provider, MD  amLODipine (NORVASC) 10 MG tablet Take 1 tablet (10 mg total) by mouth daily. 04/04/15   Beau Fanny, FNP  atenolol (TENORMIN) 50 MG tablet Take 1 tablet (50 mg total) by mouth daily. 04/04/15   Beau Fanny, FNP  gabapentin (NEURONTIN) 100 MG capsule Take 1 capsule (100 mg total) by mouth 4 (four) times daily. 04/04/15   Beau Fanny, FNP  hydrOXYzine (ATARAX/VISTARIL) 50 MG tablet Take 1 tablet (50 mg total) by mouth every 6 (six) hours as needed for anxiety. 04/04/15   Beau Fanny, FNP  phenytoin (DILANTIN) 300 MG ER capsule Take 1 capsule (300 mg total) by mouth at bedtime. 04/04/15   Beau Fanny, FNP  traZODone (DESYREL) 50 MG tablet Take 1 tablet (50 mg total) by mouth at bedtime as needed for sleep. 04/04/15   Everardo All Withrow, FNP   BP 130/74 mmHg  Pulse 86  Temp(Src) 97.9 F (36.6 C)  Resp 20  SpO2 98% Physical Exam  Constitutional: He is oriented to person, place, and time. He appears well-developed and well-nourished.  HENT:  Head: Normocephalic and atraumatic.  Mouth/Throat: Oropharynx is clear and moist.  Patient has old well-healed scars on his forehead and skull irregularities that appear consistent with very distant head injury.  Eyes: EOM are normal. Pupils are equal, round, and reactive to light.  Neck: Neck supple.  Cardiovascular: Normal rate, regular rhythm, normal heart sounds and intact distal pulses.   Pulmonary/Chest: Effort normal and breath sounds normal.  Abdominal: Soft. Bowel sounds are normal.  He exhibits no distension. There is no tenderness.  Musculoskeletal: Normal range of motion. He exhibits no edema.  Neurological: He is alert and oriented to person, place, and time. He has normal strength. Coordination normal. GCS eye subscore is 4. GCS verbal subscore is 5. GCS motor subscore is 6.  Skin: Skin is warm, dry and intact.  Psychiatric:  Patient is tearful. He is alert and appropriate. Thinking is goal-directed. No confusion or tangential speech.    ED Course  Procedures (including critical care time) Labs Review Labs Reviewed   URINE RAPID DRUG SCREEN, HOSP PERFORMED - Abnormal; Notable for the following:    Benzodiazepines POSITIVE (*)    All other components within normal limits  ETHANOL    Imaging Review No results found. I have personally reviewed and evaluated these images and lab results as part of my medical decision-making.   EKG Interpretation None      MDM   Final diagnoses:  Suicidal ideation  Depression  Polysubstance abuse   Clinically the patient is well without immediate medical emergency. He is medically cleared for psychiatric evaluation. After TTS consult, recommendation is for inpatient psychiatric treatment. Patient is transferred in stable condition.    Arby Barrette, MD 04/11/15 [phone]

## 2015-04-11 NOTE — BH Assessment (Addendum)
[facility] Assessment Progress Note  Accepted to [facility] 400 bed 1 by Rosey Bath, after 7:30pm. Please fax voluntary paperwork to [facility] [phone] and call report to [phone].  Notified [facility] Rn who states she will tell the MD.
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
## 2015-04-11 NOTE — ED Notes (Signed)
Spoke to Florence at [facility] who states that if the patient is suicidal, the patient cannot return to [facility].

## 2015-04-11 NOTE — Progress Notes (Signed)
Adult Psychoeducational Group Note  Date:  04/11/2015 Time:  11:26 PM  Group Topic/Focus:  Wrap-Up Group:   The focus of this group is to help patients review their daily goal of treatment and discuss progress on daily workbooks.  Participation Level:  Did Not Attend  Additional Comments:  Pt did not attend group due to being new to the unit.   Berlin Hun 04/11/2015, 11:26 PM

## 2015-04-11 NOTE — ED Notes (Signed)
Upon assessment, patient crying, tearful, states that he is having thoughts of wanting to harm himself, pt states "I want help, what do I need to do, cut myself? Get a gun and shoot myself?" - Pt reports feeling very anxious, struggling with substance abuse, states that he last used Cocaine 9 days ago, used [employer] meth 2 months ago. Pt is currently a resident of [facility], patient states he has had suicidal thoughts for a while that are gradually worsening. When asked about his multiple ED visits over the past few days, patient states, "I have not told anyone about it, I'm embarrassed."  During conversation, patient states, "I just want to go see my momma." Pt states his mother is deceased and he has not "had a chance to grieve over her." Reports illicit drugs were his coping mechanism.

## 2015-04-12 ENCOUNTER — Encounter (HOSPITAL_COMMUNITY): Payer: Self-pay | Admitting: Psychiatry

## 2015-04-12 DIAGNOSIS — F332 Major depressive disorder, recurrent severe without psychotic features: Principal | ICD-10-CM

## 2015-04-12 DIAGNOSIS — F329 Major depressive disorder, single episode, unspecified: Secondary | ICD-10-CM | POA: Diagnosis present

## 2015-04-12 MED ORDER — ALUM & MAG HYDROXIDE-SIMETH 200-200-20 MG/5ML PO SUSP
30.0000 mL | ORAL | Status: DC | PRN
  Administered 2015-04-14 – 2015-04-16 (×3): 30 mL via ORAL
  Filled 2015-04-12 (×3): qty 30

## 2015-04-12 MED ORDER — LORAZEPAM 1 MG PO TABS: 1.0000 mg | ORAL_TABLET | Freq: Three times a day (TID) | ORAL | Status: DC

## 2015-04-12 MED ORDER — LORAZEPAM 1 MG PO TABS: 1.0000 mg | ORAL_TABLET | Freq: Every day | ORAL | Status: DC

## 2015-04-12 MED ORDER — ADULT MULTIVITAMIN W/MINERALS CH
1.0000 | ORAL_TABLET | Freq: Every day | ORAL | Status: DC
  Administered 2015-04-12 – 2015-04-20 (×9): 1 via ORAL
  Filled 2015-04-12 (×13): qty 1

## 2015-04-12 MED ORDER — PANTOPRAZOLE SODIUM 40 MG PO TBEC
40.0000 mg | DELAYED_RELEASE_TABLET | Freq: Every day | ORAL | Status: DC
  Administered 2015-04-12 – 2015-04-20 (×9): 40 mg via ORAL
  Filled 2015-04-12 (×10): qty 1
  Filled 2015-04-12: qty 7
  Filled 2015-04-12 (×2): qty 1

## 2015-04-12 MED ORDER — ACETAMINOPHEN 325 MG PO TABS
650.0000 mg | ORAL_TABLET | Freq: Four times a day (QID) | ORAL | Status: DC | PRN
  Administered 2015-04-17 – 2015-04-18 (×2): 650 mg via ORAL
  Filled 2015-04-12 (×3): qty 2

## 2015-04-12 MED ORDER — ATENOLOL 50 MG PO TABS
50.0000 mg | ORAL_TABLET | Freq: Every day | ORAL | Status: DC
  Administered 2015-04-12 – 2015-04-20 (×9): 50 mg via ORAL
  Filled 2015-04-12 (×2): qty 1
  Filled 2015-04-12: qty 7
  Filled 2015-04-12: qty 2
  Filled 2015-04-12 (×9): qty 1

## 2015-04-12 MED ORDER — MAGNESIUM HYDROXIDE 400 MG/5ML PO SUSP: 30.0000 mL | Freq: Every day | ORAL | Status: DC | PRN

## 2015-04-12 MED ORDER — GABAPENTIN 100 MG PO CAPS
200.0000 mg | ORAL_CAPSULE | Freq: Three times a day (TID) | ORAL | Status: DC
  Administered 2015-04-12 – 2015-04-15 (×8): 200 mg via ORAL
  Filled 2015-04-12 (×11): qty 2

## 2015-04-12 MED ORDER — VITAMIN B-1 100 MG PO TABS
100.0000 mg | ORAL_TABLET | Freq: Every day | ORAL | Status: DC
  Administered 2015-04-13 – 2015-04-20 (×8): 100 mg via ORAL
  Filled 2015-04-12 (×11): qty 1

## 2015-04-12 MED ORDER — AMLODIPINE BESYLATE 10 MG PO TABS
10.0000 mg | ORAL_TABLET | Freq: Every day | ORAL | Status: DC
  Administered 2015-04-12 – 2015-04-20 (×9): 10 mg via ORAL
  Filled 2015-04-12 (×6): qty 1
  Filled 2015-04-12: qty 7
  Filled 2015-04-12 (×6): qty 1

## 2015-04-12 MED ORDER — PHENYTOIN SODIUM EXTENDED 100 MG PO CAPS
300.0000 mg | ORAL_CAPSULE | Freq: Every day | ORAL | Status: DC
  Administered 2015-04-12 – 2015-04-19 (×9): 300 mg via ORAL
  Filled 2015-04-12 (×5): qty 3
  Filled 2015-04-12: qty 21
  Filled 2015-04-12 (×9): qty 3

## 2015-04-12 MED ORDER — HYDROXYZINE HCL 50 MG PO TABS
50.0000 mg | ORAL_TABLET | Freq: Four times a day (QID) | ORAL | Status: DC | PRN
  Administered 2015-04-12: 50 mg via ORAL
  Filled 2015-04-12: qty 1

## 2015-04-12 MED ORDER — FLUOXETINE HCL 20 MG PO CAPS
20.0000 mg | ORAL_CAPSULE | Freq: Every day | ORAL | Status: DC
  Administered 2015-04-12 – 2015-04-20 (×9): 20 mg via ORAL
  Filled 2015-04-12 (×6): qty 1
  Filled 2015-04-12: qty 7
  Filled 2015-04-12 (×6): qty 1

## 2015-04-12 MED ORDER — LORAZEPAM 1 MG PO TABS: 1.0000 mg | ORAL_TABLET | Freq: Two times a day (BID) | ORAL | Status: DC

## 2015-04-12 MED ORDER — LORAZEPAM 0.5 MG PO TABS
0.5000 mg | ORAL_TABLET | Freq: Four times a day (QID) | ORAL | Status: AC | PRN
  Administered 2015-04-12 – 2015-04-15 (×6): 0.5 mg via ORAL
  Filled 2015-04-12 (×7): qty 1

## 2015-04-12 MED ORDER — TRAZODONE HCL 50 MG PO TABS
50.0000 mg | ORAL_TABLET | Freq: Every evening | ORAL | Status: DC | PRN
  Administered 2015-04-12 (×2): 50 mg via ORAL
  Filled 2015-04-12 (×2): qty 1

## 2015-04-12 MED ORDER — LORAZEPAM 1 MG PO TABS: 1.0000 mg | ORAL_TABLET | Freq: Four times a day (QID) | ORAL | Status: DC | PRN

## 2015-04-12 MED ORDER — GABAPENTIN 100 MG PO CAPS
100.0000 mg | ORAL_CAPSULE | Freq: Four times a day (QID) | ORAL | Status: DC
  Administered 2015-04-12 (×2): 100 mg via ORAL
  Filled 2015-04-12 (×10): qty 1

## 2015-04-12 MED ORDER — LOPERAMIDE HCL 2 MG PO CAPS
2.0000 mg | ORAL_CAPSULE | ORAL | Status: AC | PRN
  Administered 2015-04-14: 4 mg via ORAL
  Filled 2015-04-12: qty 2

## 2015-04-12 MED ORDER — LORAZEPAM 1 MG PO TABS
1.0000 mg | ORAL_TABLET | Freq: Four times a day (QID) | ORAL | Status: DC
  Administered 2015-04-12: 1 mg via ORAL
  Filled 2015-04-12: qty 1

## 2015-04-12 NOTE — [facility]HH Group Notes (Signed)
[facility] LCSW Aftercare Discharge Planning Group Note  04/12/2015 8:45 AM  Participation Quality: Alert, Appropriate and Oriented  Mood/Affect: Tearful  Depression Rating: 10  Anxiety Rating: 10  Thoughts of Suicide: Pt denies SI/HI  Will you contract for safety? Yes  Current AVH: Pt denies  Plan for Discharge/Comments: Pt attended discharge planning group and actively participated in group. CSW discussed suicide prevention education with the group and encouraged them to discuss discharge planning and any relevant barriers. Pt reports high levels of depression and anxiety and is requesting a referral to [facility]. No other needs expressed at this time.  Transportation Means: Pt reports access to transportation  Supports: No supports mentioned at this time  Chad Cordial, LCSWA 04/12/2015 9:40 AM

## 2015-04-12 NOTE — Tx Team (Signed)
Interdisciplinary Treatment Plan Update (Adult) Date: 04/12/2015   Date: 04/12/2015 12:52 PM  Progress in Treatment:  Attending groups: Yes  Participating in groups: Yes  Taking medication as prescribed: Yes  Tolerating medication: Yes  Family/Significant othe contact made: No, CSW assessing for appropriate contacts Patient understands diagnosis: Yes, AEB seeking help with depression and substance abuse Discussing patient identified problems/goals with staff: Yes  Medical problems stabilized or resolved: Yes  Denies suicidal/homicidal ideation: Yes Patient has not harmed self or Others: Yes   New problem(s) identified: None identified at this time.   Discharge Plan or Barriers: Pt reports that he would like to be referred to [facility].  Additional comments:  Patient and CSW reviewed pt's identified goals and treatment plan. Patient verbalized understanding and agreed to treatment plan. CSW reviewed [facility] "Discharge Process and Patient Involvement" Form. Pt verbalized understanding of information provided and signed form.   Reason for Continuation of Hospitalization:  Anxiety Depression Medication stabilization Suicidal ideation Withdrawal symptoms  Estimated length of stay: 3-5 days  Review of initial/current patient goals per problem list:   1.  Goal(s): Patient will participate in aftercare plan  Met:  No  Target date: 3-5 days from date of admission   As evidenced by: Patient will participate within aftercare plan AEB aftercare provider and housing plan at discharge being identified.   04/12/15: Pt does not want to return to [facility]. He is requesting a referral to [facility] in [place]  2.  Goal (s): Patient will exhibit decreased depressive symptoms and suicidal ideations.  Met:  No  Target date: 3-5 days from date of admission   As evidenced by: Patient will utilize self rating of depression at 3 or below and demonstrate decreased  signs of depression or be deemed stable for discharge by MD.  04/09/15: Pt rates depression at 10/10; denies SI. Continues to be tearful frequently  3.  Goal(s): Patient will demonstrate decreased signs and symptoms of anxiety.  Met:  No  Target date: 3-5 days from date of admission   As evidenced by: Patient will utilize self rating of anxiety at 3 or below and demonstrated decreased signs of anxiety, or be deemed stable for discharge by MD  04/12/15: Pt rates anxiety at 10/10 4.  Goal(s): Patient will demonstrate decreased signs of withdrawal due to substance abuse  Met:  No  Target date: 3-5 days from date of admission   As evidenced by: Patient will produce a CIWA/COWS score of 0, have stable vitals signs, and no symptoms of withdrawal o 04/12/15: Pt has CIWA score of 13; endorses nausea, tactile disturbances, sweats, anxiety, and headache as main symptoms of withdrawal.  Attendees:  Patient:    Family:    Physician: Dr. Parke Poisson, MD  04/12/2015 12:52 PM  Nursing: Lars Pinks, RN Case manager  04/12/2015 12:52 PM  Clinical Social Worker Peri Maris, [place] 04/12/2015 12:52 PM  Other: Tilden Fossa, [employer] 04/12/2015 12:52 PM  Clinical: Gaylan Gerold,  RN 04/12/2015 12:52 PM  Other: , RN Charge Nurse 04/12/2015 12:52 PM  Other: Hilda Lias, [place], [place] Work [phone]

## 2015-04-12 NOTE — Progress Notes (Signed)
Patient ID: Barry Daugherty, male   DOB: December 28, 1970, 44 y.o.   MRN: [id number]  DAR: Pt. Denies SI/HI and A/V Hallucinations. Patient does report pain and discomfort related to withdrawal. He reports sleep was poor, appetite is fair, energy is low, and concentration level is poor. He rates depression 7/10, hopelessness 8/10, and anxiety 7/10. Support and encouragement provided to the patient. Scheduled medications administered to patient per physician's orders. Patient reports increased anxiety this morning reporting that he was having trouble breathing. Vital signs were WDL. Writer administered PRN Vistaril to patient however patient reported no change. Patient did admit that he had taken several unprescribed benzos pta but was unable to tell writer how much. Patient did report withdrawals including anxiety, agitation, nausea, and headache. Q15 minute checks are maintained for safety.

## 2015-04-12 NOTE — Progress Notes (Signed)
Admission Note:  Patient is alert and oriented. He tearful and states he is here because he "felt suicidal." Patient states he is here because he uses drugs and need to get some help because he has no one. He states that "I lost my mom and dad and others and I have no one." Patient states siblings will not have anything to do with him because of his drug use. Patient states I would like to work on my "depression, anxiety, a place to live, and disability while I am here." Patient denies SI and HI at this time and does contract for safety. Patient denies A/V hallucinations. Admission packet reviewed with patient and patient given handbook. Patient signed consent for treatment. Patient skin is intact and patient had tattoos noted to bilateral upper arms and a old surgical scar to left side. Patient also has a body rash. Patient oriented to unit and staff and room. Q 15 minute safety checks initiated and monitoring continues.

## 2015-04-12 NOTE — Plan of Care (Signed)
Problem: Diagnosis: Increased Risk For Suicide Attempt Goal: STG-Patient Will Comply With Medication Regime Outcome: Progressing He is compliant with medication regime
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
## 2015-04-12 NOTE — [facility] Group Notes (Signed)
[facility] LCSW Group Therapy 04/12/2015 1:15 PM  Type of Therapy: Group Therapy- Emotion Regulation  Participation Level: Active   Participation Quality:  Appropriate  Affect: Appropriate  Cognitive: Alert and Oriented   Insight:  Developing/Improving  Engagement in Therapy: Developing/Improving and Engaged   Modes of Intervention: Clarification, Confrontation, Discussion, Education, Exploration, Limit-setting, Orientation, Problem-solving, Rapport Building, Dance movement psychotherapist, Socialization and Support  Summary of Progress/Problems: The topic for group today was emotional regulation. This group focused on both positive and negative emotion identification and allowed group members to process ways to identify feelings, regulate negative emotions, and find healthy ways to manage internal/external emotions. Group members were asked to reflect on a time when their reaction to an emotion led to a negative outcome and explored how alternative responses using emotion regulation would have benefited them. Group members were also asked to discuss a time when emotion regulation was utilized when a negative emotion was experienced. Pt was actively involved in group discussion. He identified that he often self-medicates in order to manage his emotions. He was able to identify negative consequences of unregulated emotions such as lost relationships and limited support. He identified that using deep breathing and going to therapy could help him begin to regulate emotions more effectively.   Chad Cordial, LCSWA 04/12/2015 3:00 PM

## 2015-04-12 NOTE — Progress Notes (Signed)
Adult Psychoeducational Group Note  Date:  04/12/2015 Time:  10:28 PM  Group Topic/Focus:  Wrap-Up Group:   The focus of this group is to help patients review their daily goal of treatment and discuss progress on daily workbooks.  Participation Level:  Active  Participation Quality:  Appropriate and Sharing  Affect:  Appropriate  Cognitive:  Alert and Appropriate  Insight: Appropriate  Engagement in Group:  Engaged  Modes of Intervention:  Discussion  Additional Comments:  Pt said day was 4, now it's an 8. "I talked to people that helped me out." Something positive was coming here. A goal for pt is to keep himself sober. "I like it here and everyone is awesome."  Burman Freestone 04/12/2015, 10:28 PM
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
## 2015-04-12 NOTE — [facility] Suicide Risk Assessment (Signed)
[facility] Admission Suicide Risk Assessment   Nursing information obtained from:   patient and chart  Demographic factors:   44 year old man, currently homeless  Current Mental Status:   see below  Loss Factors:   homelessness, limited social/family support  Historical Factors:   depression, anxiety, substance abuse  Risk Reduction Factors:   resilience  Total Time spent with patient: 45 minutes Principal Problem:  Major Depression, Recurrent  Diagnosis:   Patient Active Problem List   Diagnosis Date Noted  . MDD (major depressive disorder) (HCC) [F32.9] 04/12/2015  . 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 Severe episode of recurrent major depressive disorder, without psychotic features (HCC) [F33.2]   . GAD (generalized anxiety disorder) [F41.1]      Continued Clinical Symptoms:  Alcohol Use Disorder Identification Test Final Score (AUDIT): 0 The "Alcohol Use Disorders Identification Test", Guidelines for Use in Primary Care, Second Edition.  World [employer] writer [facility]). Score between 0-7:  no or low risk or alcohol related problems. Score between 8-15:  moderate risk of alcohol related problems. Score between 16-19:  high risk of alcohol related problems. Score 20 or above:  warrants further diagnostic evaluation for alcohol dependence and treatment.   CLINICAL FACTORS:  44 year old man, currently homeless, presents for depression , anxiety. Recent suicidal ideations.     Psychiatric Specialty Exam: Physical Exam  ROS  Blood pressure 112/58, pulse 87, temperature 98.1 F (36.7 C), temperature source Oral, resp. rate 18, height 5\' 11"  (1.803 m), weight 234 lb (106.142 kg).Body mass index is 32.65 kg/(m^2).   see admit note MSE                                                        COGNITIVE FEATURES THAT CONTRIBUTE TO RISK:  Closed-mindedness and Loss of executive function    SUICIDE RISK:   Moderate:  Frequent suicidal  ideation with limited intensity, and duration, some specificity in terms of plans, no associated intent, good self-control, limited dysphoria/symptomatology, some risk factors present, and identifiable protective factors, including available and accessible social support.  PLAN OF CARE: Patient will be admitted to inpatient psychiatric unit for stabilization and safety. Will provide and encourage milieu participation. Provide medication management and maked adjustments as needed.  Will follow daily.    Medical Decision Making:  Review of Psycho-Social Stressors (1), Review or order clinical lab tests (1), Established Problem, Worsening (2) and Review of New Medication or Change in Dosage (2)  I certify that inpatient services furnished can reasonably be expected to improve the patient's condition.   Mabry Tift 04/12/2015, 4:27 PM

## 2015-04-13 MED ORDER — TRAZODONE HCL 100 MG PO TABS
100.0000 mg | ORAL_TABLET | Freq: Every evening | ORAL | Status: DC | PRN
  Administered 2015-04-13 – 2015-04-19 (×6): 100 mg via ORAL
  Filled 2015-04-13 (×5): qty 1
  Filled 2015-04-13: qty 7
  Filled 2015-04-13 (×2): qty 1

## 2015-04-13 NOTE — Progress Notes (Signed)
Patient ID: Barry Daugherty, male   DOB: 1970-07-05, 44 y.o.   MRN: [id number]  Adult Psychoeducational Group Note  Date:  04/13/2015 Time:  09:00am  Group Topic/Focus:  Self Care:   The focus of this group is to help patients understand the importance of self-care in order to improve or restore emotional, physical, spiritual, interpersonal, and financial health.  Participation Level:  Active  Participation Quality:  Attentive  Affect:  Appropriate  Cognitive:  Appropriate  Insight: Improving  Engagement in Group:  Engaged  Modes of Intervention:  Activity, Discussion, Education and Support  Additional Comments:  Pt able to identify at least one relaxation technique to utilize post discharge from [facility].   Barry Daugherty 04/13/2015, 11:01 AM

## 2015-04-13 NOTE — Progress Notes (Signed)
[facility] MD Progress Note  04/13/2015 3:33 PM Leslee Haueter  MRN:  [id number] Subjective:  Patient reports ongoing feelings of sadness, depression, loss, related to loss of loved ones over the years. He denies medication side effects. Objective : I have discussed case with treatment team and have met with patient. Patient pleasant on approach, visible on unit, going to some groups. As above, reports ongoing depression, and states he has had episodes of tearfulness when thinking about family. Denies any suicidal ideations. Although depressed, affect is reactive and does smile briefly at times today. Denies medication side effects. Has had some vivid dreams, which may be related to Trazodone. States sleep has been fair and is requesting for Trazodone dose to be increased . Principal Problem: MDD (major depressive disorder) ([place]) Diagnosis:   Patient Active Problem List   Diagnosis Date Noted  . MDD (major depressive disorder) ([place]) [F32.9] 04/12/2015  . Substance induced mood disorder ([place]) [F19.94] 04/04/2015  . Severe episode of recurrent major depressive disorder, without psychotic features ([place]) [F33.2]   . GAD (generalized anxiety disorder) [F41.1]    Total Time spent with patient: 20 minutes   Past Medical History:  Past Medical History  Diagnosis Date  . Seizures [facility])     Past Surgical History  Procedure Laterality Date  . Nephrectomy    . Cholecystectomy     Family History: History reviewed. No pertinent family history.  Social History:  History  Alcohol Use No    Comment: [facility]     History  Drug Use  . Yes  . Special: Cocaine, Methamphetamines    Comment: last took 4 - 5 days ago    Social History   Social History  . Marital Status: Single    Spouse Name: N/A  . Number of Children: N/A  . Years of Education: N/A   Social History Main Topics  . Smoking status: Current Some Day Smoker    Types: Cigarettes  . Smokeless tobacco: None  . Alcohol Use:  No     Comment: [facility]  . Drug Use: Yes    Special: Cocaine, Methamphetamines     Comment: last took 4 - 5 days ago  . Sexual Activity: Not Asked   Other Topics Concern  . None   Social History Narrative   Additional Social History:   Sleep: Fair  Appetite:  Good  Current Medications: Current Facility-Administered Medications  Medication Dose Route Frequency Provider Last Rate Last Dose  . acetaminophen (TYLENOL) tablet 650 mg  650 mg Oral Q6H PRN Jenne Campus, MD      . alum & mag hydroxide-simeth (MAALOX/MYLANTA) 200-200-20 MG/5ML suspension 30 mL  30 mL Oral Q4H PRN Myer Peer Cobos, MD      . amLODipine (NORVASC) tablet 10 mg  10 mg Oral Daily Jenne Campus, MD   10 mg at 04/13/15 0809  . atenolol (TENORMIN) tablet 50 mg  50 mg Oral Daily Jenne Campus, MD   50 mg at 04/13/15 0810  . FLUoxetine (PROZAC) capsule 20 mg  20 mg Oral Daily Jenne Campus, MD   20 mg at 04/13/15 0810  . gabapentin (NEURONTIN) capsule 200 mg  200 mg Oral TID Jenne Campus, MD   200 mg at 04/13/15 1201  . loperamide (IMODIUM) capsule 2-4 mg  2-4 mg Oral PRN Jenne Campus, MD      . LORazepam (ATIVAN) tablet 0.5 mg  0.5 mg Oral Q6H PRN Jenne Campus, MD  0.5 mg at 04/13/15 0814  . magnesium hydroxide (MILK OF MAGNESIA) suspension 30 mL  30 mL Oral Daily PRN Jenne Campus, MD      . multivitamin with minerals tablet 1 tablet  1 tablet Oral Daily Jenne Campus, MD   1 tablet at 04/13/15 0809  . pantoprazole (PROTONIX) EC tablet 40 mg  40 mg Oral Daily Jenne Campus, MD   40 mg at 04/13/15 0810  . phenytoin (DILANTIN) ER capsule 300 mg  300 mg Oral QHS Jenne Campus, MD   300 mg at 04/12/15 2110  . thiamine (VITAMIN B-1) tablet 100 mg  100 mg Oral Daily Jenne Campus, MD   100 mg at 04/13/15 0810  . traZODone (DESYREL) tablet 100 mg  100 mg Oral QHS PRN Jenne Campus, MD        Lab Results: No results found for this or any previous visit (from the past 48  hour(s)).  Physical Findings: AIMS:  , ,  ,  ,    CIWA:  CIWA-Ar Total: 2 COWS:     Musculoskeletal: Strength & Muscle Tone: within normal limits Gait & Station: normal Patient leans: N/A  Psychiatric Specialty Exam: ROS no seizures, no chest pain, no SOB, no rash   Blood pressure 133/79, pulse 89, temperature 97.9 F (36.6 C), temperature source Oral, resp. rate 16, height _0  (1.803 m), weight 234 lb (106.142 kg).Body mass index is 32.65 kg/(m^2).  General Appearance: Fairly Groomed  Engineer, water::  Good  Speech:  Normal Rate  Volume:  Normal  Mood:  Depressed- but partially improved compared to admission  Affect:  Appropriate and Constricted  Thought Process:  Linear  Orientation:  Full (Time, Place, and Person)  Thought Content:  denies hallucinations , no delusions   Suicidal Thoughts:  No today denies any plan or intention of hurting self or of SI  Homicidal Thoughts:  No  Memory:  recent and remote grossly intact   Judgement:  Other:  improved   Insight:  Fair  Psychomotor Activity:  Normal  Concentration:  Good  Recall:  Good  Fund of Knowledge:Good  Language: Good  Akathisia:  Negative  Handed:  Right  AIMS (if indicated):     Assets:  Desire for Improvement Resilience  ADL's:  Intact  Cognition: WNL  Sleep:     Assessment - patient remains depressed, constricted in affect, but improving compared to admission presentation. At this time denies suicidal ideations. Sleep fair. Patient has history of seizure disorder, no seizure like activity on unit , and tolerating Dilantin well at present. No medication side effects. Treatment Plan Summary: Daily contact with patient to assess and evaluate symptoms and progress in treatment, Medication management, Plan inpatient admission and medications as below Encourage  Milieu , group participation to work on coping skills and symptom reduction Continue Dilantin for history of Seizure Disorder Dilantin serum level pending   Increase Trazodone to 100 mgrs QHS PRN for insomnia as needed  Continue Prozac 20 mgrs QDAY for depression Continue Neurontin 200 mgrs TID for anxiety Continue Ativan 0.5 mgrs Q 6 hours PRN for anxiety as needed  COBOS, FERNANDO 04/13/2015, 3:33 PM

## 2015-04-13 NOTE — Progress Notes (Signed)
D: Pt is alert and oriented x4. Pt was seen in the dayroom; however, flat and withdrawn to self. Pt endorse severe anxiety and depression; states, "I never thought I would be in a place like this at a time like this; I really miss my family." Pt also endorses moderate ankle pain of 6 on a 0-10 pain scale. Pt denies SI, HI, and AVH. Pt continued to be cooperative and nonviolent through the assessment. A: Medications offered as prescribed.  Support, encouragement, and safe environment provided.  15-minute safety checks continue. R: Pt was med compliant.  Pt attended wrap-up group. Safety checks continue.

## 2015-04-13 NOTE — [facility] Group Notes (Signed)
[facility] Mental [employer] Group Therapy 04/13/2015 1:15pm  Type of Therapy: Mental Health Association Presentation  Participation Level: Active  Participation Quality: Attentive  Affect: Appropriate  Cognitive: Oriented  Insight: Developing/Improving  Engagement in Therapy: Engaged  Modes of Intervention: Discussion, Education and Socialization  Summary of Progress/Problems: Mental Health Association (MHA) Speaker came to talk about his personal journey with substance abuse and addiction. The pt processed ways by which to relate to the speaker. MHA speaker provided handouts and educational information pertaining to groups and services offered by the MHA. Pt was engaged in speaker's presentation and was receptive to resources provided.    Pookela Sellin, MSW, LCSW Clinical Social Worker   

## 2015-04-13 NOTE — Progress Notes (Signed)
Patient ID: Barry Daugherty, male   DOB: [birth year], 44 y.o.   MRN: [id number]  Pt currently presents with a flat affect and anxious behavior. Per self inventory, pt rates depression, hopelessness and anxiety at a 9. Pt's daily goal is to "myself" and they intend to do so by "talk." Pt reports fair sleep, a good appetite, low energy and good concentration. Pt interacts with other pts and actively participates in groups.   Pt provided with medications per providers orders. Pt's labs and vitals were monitored throughout the day. Pt supported emotionally and encouraged to express concerns and questions. Pt educated on medications. Pt asks if his belongings can be transferred from [facility] to [facility] because "I don't plan on going back there." [facility] will send belongings if the patient is being transferred to a new facility. However if he discharges himself from [facility] he will need to gather his belongings personally.   Pt's safety ensured with 15 minute and environmental checks. Pt currently denies SI/HI and A/V hallucinations. Pt verbally agrees to seek staff if SI/HI or A/VH occurs and to consult with staff before acting on these thoughts. Pt insight improving. Will continue POC.

## 2015-04-13 NOTE — [facility] Suicide Risk Assessment (Signed)
[facility] INPATIENT:  Family/Significant Other Suicide Prevention Education  Suicide Prevention Education:  Patient Refusal for Family/Significant Other Suicide Prevention Education: The patient Barry Daugherty has refused to provide written consent for family/significant other to be provided Family/Significant Other Suicide Prevention Education during admission and/or prior to discharge.  Physician notified. SPE reviewed with patient and brochure provided. Patient encouraged to return to hospital if having suicidal thoughts, patient verbalized his/her understanding and has no further questions at this time.   Elaina Hoops 04/13/2015, 3:11 PM

## 2015-04-13 NOTE — Progress Notes (Signed)
Adult Psychoeducational Group Note  Date:  04/13/2015 Time:  9:54 PM  Group Topic/Focus:  Wrap-Up Group:   The focus of this group is to help patients review their daily goal of treatment and discuss progress on daily workbooks.  Participation Level:  Active  Participation Quality:  Appropriate  Affect:  Appropriate  Cognitive:  Alert  Insight: Appropriate  Engagement in Group:  Engaged  Modes of Intervention:  Discussion  Additional Comments:  Patient stated his goal for today was to find a treatment center. Patient stated he has not found one yet. Patient stated there was a great speaker during group today.   Barry Daugherty 04/13/2015, 9:54 PM

## 2015-04-14 LAB — PHENYTOIN LEVEL, FREE AND TOTAL
Phenytoin, Free: NOT DETECTED ug/mL (ref 1.0–2.0)
Phenytoin, Total: 5.1 ug/mL — ABNORMAL LOW (ref 10.0–20.0)

## 2015-04-14 NOTE — Progress Notes (Signed)
Adult Psychoeducational Group Note  Date:  04/14/2015 Time:  8:53 PM  Group Topic/Focus:  Wrap-Up Group:   The focus of this group is to help patients review their daily goal of treatment and discuss progress on daily workbooks.  Participation Level:  Active  Participation Quality:  Appropriate  Affect:  Appropriate  Cognitive:  Alert  Insight: Appropriate  Engagement in Group:  Engaged  Modes of Intervention:  Discussion  Additional Comments:  Patient stated having a better day today.   Barry Daugherty L Loc Feinstein 04/14/2015, 8:53 PM

## 2015-04-14 NOTE — Progress Notes (Signed)
Patient ID: Barry Daugherty, male   DOB: 14-Mar-1971, 44 y.o.   MRN: [id number] [facility] MD Progress Note  04/14/2015 3:05 PM Barry Daugherty  MRN:  [id number] Subjective:  Patient reports he has been feeling more depressed, " upset " after a recent phone conversation with his aunt via phone. States she was non supportive , critical, making statements such as telling him she felt he was lying, unmotivated in getting better. States " that really ruined my day". States that since then has felt more depressed . States this increased passive suicidal thoughts, but denies plan or intention of hurting self or of SI and contracts for safety on unit . He also has been experiencing some increased cravings for his substance of choice - methamphetamine, but states he is motivated in sobriety.  Denies medication side effects. States he slept better last night ( on Trazodone )  Objective : I have discussed case with treatment team and have met with patient. Patient presents depressed, ruminative about stressors and about recent phone conversation with aunt , as above . States " I cry when I remember how mean she was ".  (+) cravings for methamphetamine , as above. Denies medication side effects, feels medications are helping . Earlier had experienced some dizziness, but not currently. Has been going to groups, visible on unit . Responds to support, encouragement, empathy. Affect improves partially during session.   Principal Problem: MDD (major depressive disorder) ([place]) Diagnosis:   Patient Active Problem List   Diagnosis Date Noted  . MDD (major depressive disorder) ([place]) [F32.9] 04/12/2015  . Substance induced mood disorder ([place]) [F19.94] 04/04/2015  . Severe episode of recurrent major depressive disorder, without psychotic features ([place]) [F33.2]   . GAD (generalized anxiety disorder) [F41.1]    Total Time spent with patient: 20 minutes   Past Medical History:  Past Medical History  Diagnosis Date  .  Seizures [facility])     Past Surgical History  Procedure Laterality Date  . Nephrectomy    . Cholecystectomy     Family History: History reviewed. No pertinent family history.  Social History:  History  Alcohol Use No    Comment: [facility]     History  Drug Use  . Yes  . Special: Cocaine, Methamphetamines    Comment: last took 4 - 5 days ago    Social History   Social History  . Marital Status: Single    Spouse Name: N/A  . Number of Children: N/A  . Years of Education: N/A   Social History Main Topics  . Smoking status: Current Some Day Smoker    Types: Cigarettes  . Smokeless tobacco: None  . Alcohol Use: No     Comment: [facility]  . Drug Use: Yes    Special: Cocaine, Methamphetamines     Comment: last took 4 - 5 days ago  . Sexual Activity: Not Asked   Other Topics Concern  . None   Social History Narrative   Additional Social History:   Sleep:  Improved  Appetite:   Fair   Current Medications: Current Facility-Administered Medications  Medication Dose Route Frequency Provider Last Rate Last Dose  . acetaminophen (TYLENOL) tablet 650 mg  650 mg Oral Q6H PRN Jenne Campus, MD      . alum & mag hydroxide-simeth (MAALOX/MYLANTA) 200-200-20 MG/5ML suspension 30 mL  30 mL Oral Q4H PRN Myer Peer Talise Sligh, MD      . amLODipine (NORVASC) tablet 10 mg  10 mg Oral  Daily Jenne Campus, MD   10 mg at 04/14/15 0749  . atenolol (TENORMIN) tablet 50 mg  50 mg Oral Daily Jenne Campus, MD   50 mg at 04/14/15 0749  . FLUoxetine (PROZAC) capsule 20 mg  20 mg Oral Daily Jenne Campus, MD   20 mg at 04/14/15 0749  . gabapentin (NEURONTIN) capsule 200 mg  200 mg Oral TID Jenne Campus, MD   200 mg at 04/14/15 1150  . loperamide (IMODIUM) capsule 2-4 mg  2-4 mg Oral PRN Jenne Campus, MD   4 mg at 04/14/15 0820  . LORazepam (ATIVAN) tablet 0.5 mg  0.5 mg Oral Q6H PRN Jenne Campus, MD   0.5 mg at 04/14/15 0753  . magnesium hydroxide (MILK OF MAGNESIA)  suspension 30 mL  30 mL Oral Daily PRN Jenne Campus, MD      . multivitamin with minerals tablet 1 tablet  1 tablet Oral Daily Jenne Campus, MD   1 tablet at 04/14/15 0749  . pantoprazole (PROTONIX) EC tablet 40 mg  40 mg Oral Daily Jenne Campus, MD   40 mg at 04/14/15 0749  . phenytoin (DILANTIN) ER capsule 300 mg  300 mg Oral QHS Jenne Campus, MD   300 mg at 04/13/15 2119  . thiamine (VITAMIN B-1) tablet 100 mg  100 mg Oral Daily Jenne Campus, MD   100 mg at 04/14/15 0749  . traZODone (DESYREL) tablet 100 mg  100 mg Oral QHS PRN Jenne Campus, MD   100 mg at 04/13/15 2119    Lab Results: No results found for this or any previous visit (from the past 48 hour(s)).  Physical Findings: AIMS:  , ,  ,  ,    CIWA:  CIWA-Ar Total: 1 COWS:     Musculoskeletal: Strength & Muscle Tone: within normal limits Gait & Station: normal Patient leans: N/A  Psychiatric Specialty Exam: ROS no seizures, no chest pain, no SOB, no rash   Blood pressure 128/90, pulse 98, temperature 97.8 F (36.6 C), temperature source Oral, resp. rate 18, height 5' 11"  (1.803 m), weight 234 lb (106.142 kg).Body mass index is 32.65 kg/(m^2).  General Appearance:  Improved grooming   Eye Contact::  Good  Speech:  Normal Rate  Volume:  Normal  Mood:  Depressed- but partially improved compared to admission  Affect:  Still constricted   Thought Process:  Linear  Orientation:  Full (Time, Place, and Person)  Thought Content:  denies hallucinations , no delusions   Suicidal Thoughts:  Describes some passive thoughts of death,but today denies any plan or intention of hurting self or of SI and is able to contract for safety on unit   Homicidal Thoughts:  No- specifically, also denies any thoughts of hurting aunt or any other family member   Memory:  recent and remote grossly intact   Judgement:  Other:  improved   Insight:  Fair  Psychomotor Activity:  Normal  Concentration:  Good  Recall:  Good   Fund of Knowledge:Good  Language: Good  Akathisia:  Negative  Handed:  Right  AIMS (if indicated):     Assets:  Desire for Improvement Resilience  ADL's:  Intact  Cognition: WNL  Sleep:     Assessment - patient  Remains depressed, sad , particularly after an upsetting phone conversation yesterday with an aunt, who was critical and unsupportive. Still reports crying easily, depression, passive thoughts of death, but denies any  suicidal ideations. Does feel medications are helping, and did sleep better last night. History of seizure disorder but no seizures on unit ( on dilantin) . Tolerating medications ( Neurontin, Prozac, Dilantin) well.  Treatment Plan Summary: Daily contact with patient to assess and evaluate symptoms and progress in treatment, Medication management, Plan inpatient admission and medications as below Encourage  Milieu , group participation to work on coping skills and symptom reduction Continue Dilantin for history of Seizure Disorder Dilantin serum level pending  Continue Trazodone 100 mgrs QHS PRN for insomnia as needed  Continue Prozac 20 mgrs QDAY for depression Continue Neurontin 200 mgrs TID for anxiety Continue Ativan 0.5 mgrs Q 6 hours PRN for anxiety as needed  Barry Daugherty 04/14/2015, 3:05 PM

## 2015-04-14 NOTE — [facility] Group Notes (Signed)
[facility] LCSW Group Therapy 04/14/2015 1:15pm  Type of Therapy: Group Therapy- Feelings Around Relapse and Recovery  Participation Level: Active   Participation Quality:  Appropriate  Affect:  Appropriate  Cognitive: Alert and Oriented   Insight:  Developing   Engagement in Therapy: Developing/Improving and Engaged   Modes of Intervention: Clarification, Confrontation, Discussion, Education, Exploration, Limit-setting, Orientation, Problem-solving, Rapport Building, Dance movement psychotherapist, Socialization and Support  Summary of Progress/Problems: The topic for today was feelings about relapse. The group discussed what relapse prevention is to them and identified triggers that they are on the path to relapse. Members also processed their feeling towards relapse and were able to relate to common experiences. Group also discussed coping skills that can be used for relapse prevention.  Barry Daugherty was observed to be attentive in group as he discussed his triggers to relapse. 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 Patient stated how his support has diminished due to his family not seeing him make progress in abstaining from substance use. Patient ended group stating his desire to prevent future relapse by taking care of himself and making himself proud most importantly.    Therapeutic Modalities:   Cognitive Behavioral Therapy Solution-Focused Therapy Assertiveness Training Relapse Prevention Therapy    Barry Daugherty [phone] 04/14/2015 2:25 PM

## 2015-04-14 NOTE — [facility] Group Notes (Signed)
[facility] LCSW Aftercare Discharge Planning Group Note  04/14/2015 8:45 AM  Participation Quality: Alert, Appropriate and Oriented  Mood/Affect: Congruent  Depression Rating: 10  Anxiety Rating: 10  Thoughts of Suicide: Pt endorses passive SI  Will you contract for safety? Yes  Current AVH: Pt denies  Plan for Discharge/Comments: Pt attended discharge planning group and actively participated in group. CSW discussed suicide prevention education with the group and encouraged them to discuss discharge planning and any relevant barriers. Pt expresses that he had a difficult night last night but that he has began to cope with his feelings of worthlessness. Pt expresses a desire to go to [facility].  Transportation Means: Pt reports access to transportation  Supports: No supports mentioned at this time  Chad Cordial, LCSWA 04/14/2015 9:49 AM

## 2015-04-14 NOTE — Progress Notes (Signed)
D: Barry Daugherty was initially complaining of dizziness. His vitals signs were taken and he was negative for orthostasis. He was normotensive.  Encouraged Barry Daugherty to increase his PO fluid intake. He rates Anxiety 9/10 Depression 9/10 and Hopelessness 9/10. Denies SI/HI/AVH. He states he called to speak to his grandparents today and his aunt answered the phone and was very rude and short with him. He is tearful as he speaks. He states "I will have to pray for her". Praised Barry Daugherty for utilizing positive coping skills.  A: Q 15 minute checks for patient safety. Encouragement and support given. Medications administered as prescribed.  R: Continue to monitor for patient safety and medication effectiveness.

## 2015-04-14 NOTE — Progress Notes (Addendum)
Patient ID: Barry Daugherty, male   DOB: 1970-04-19, 44 y.o.   MRN: [id number]  Pt currently presents with a flat affect and anxious behavior. Per self inventory, pt rates depression at a 10, hopelessness 10 and anxiety 9. Pt's daily goal is to "try to be happier" and they intend to do so by "smile more." Pt reports fair sleep, a fair appetite, low energy and poor concentration. Pt reports to writer tearfully that his aunt told him negative things about his treatment yesterday on the phone. Pt reports increased anxiety due to her statement. Pt reports dizziness, anxiety and self harm thoughts.   Pt provided with medications per providers orders. Pt's labs and vitals were monitored throughout the day. Pt supported emotionally and encouraged to express concerns and questions during a 1:1. Pt educated on medications and alternative relaxation methods. Pt complains of swelling in lower extremities, assessment finds dry, flaky skin on lower extremities, slight pitting edema noted in R ankle.   Pt's safety ensured with 15 minute and environmental checks. Pt currently endorses SI, no plan. Pt currently denies HI and A/V hallucinations. Pt verbally agrees to seek staff if HI or A/VH occurs and to consult with staff before acting on any harmful thoughts. Will continue POC.

## 2015-04-14 NOTE — Progress Notes (Signed)
Marcelles has no additional c/o dizziness at this time.

## 2015-04-14 NOTE — Progress Notes (Addendum)
D: Barry Daugherty's mood is very labile. He goes from crying episodes related to a phone conversation with Barry Daugherty from almost 2 nights ago to laughing and smiling when talking about having dreams of Barry parents and siblings at night. He Rates Anxiety, Depression and Hopelessness 9/10. He endorses passive SI with no plan; contracts for safety. Denies HI/AVH. Barry goal is to be discharged to the [facility]. He is under the impression this is where he will be staying once he is discharged from [facility]. He is very pleases about this. A: Q 15 minute checks for patient safety. Medications administered as prescribed. Encouragement and support given. R: Continue to monitor for patient safety and medication effectiveness.

## 2015-04-15 MED ORDER — LORAZEPAM 0.5 MG PO TABS
0.5000 mg | ORAL_TABLET | Freq: Four times a day (QID) | ORAL | Status: DC | PRN
  Administered 2015-04-15 – 2015-04-19 (×10): 0.5 mg via ORAL
  Filled 2015-04-15 (×9): qty 1

## 2015-04-15 MED ORDER — GABAPENTIN 300 MG PO CAPS
300.0000 mg | ORAL_CAPSULE | Freq: Three times a day (TID) | ORAL | Status: DC
  Administered 2015-04-15 – 2015-04-20 (×20): 300 mg via ORAL
  Filled 2015-04-15 (×4): qty 1
  Filled 2015-04-15: qty 28
  Filled 2015-04-15 (×3): qty 1
  Filled 2015-04-15: qty 28
  Filled 2015-04-15 (×6): qty 1
  Filled 2015-04-15: qty 28
  Filled 2015-04-15 (×14): qty 1
  Filled 2015-04-15: qty 28
  Filled 2015-04-15 (×4): qty 1

## 2015-04-15 MED ORDER — NITROGLYCERIN 0.4 MG SL SUBL
0.4000 mg | SUBLINGUAL_TABLET | SUBLINGUAL | Status: DC | PRN
  Administered 2015-04-15 (×3): 0.4 mg via SUBLINGUAL
  Filled 2015-04-15: qty 1

## 2015-04-15 MED ORDER — LORAZEPAM 1 MG PO TABS
1.0000 mg | ORAL_TABLET | Freq: Once | ORAL | Status: AC
  Administered 2015-04-15: 1 mg via ORAL
  Filled 2015-04-15: qty 1

## 2015-04-15 NOTE — [facility] Group Notes (Signed)
Adult Psychoeducational Group Note  Date:  04/15/2015 Time:  9:13 PM  Group Topic/Focus:  Wrap-Up Group:   The focus of this group is to help patients review their daily goal of treatment and discuss progress on daily workbooks.  Participation Level:  Active  Participation Quality:  Appropriate  Affect:  Appropriate  Cognitive:  Appropriate  Insight: Good  Engagement in Group:  Engaged  Modes of Intervention:  Discussion  Additional Comments:  Patient stated his goal was to stay positive and focused.  He plans on going to [facility] when discharged.  His goal for the new year is to stay clean and sober and have a better mindset in life.  Caroll Rancher A 04/15/2015, 9:13 PM

## 2015-04-15 NOTE — Progress Notes (Signed)
D) Pt has been anxious today. Pt was complaining of chest pain this morning. Vital signs normal. States he was having chest pain rating it a 10, went to group after receiving digoxin at 1007 and was removed from group at 1018 to receive the second dose. Pt received his third dose at 1023. Pt states this  pain has happened before, "especially when I get anxious". Easily moved to tears when talking about his past. After receiving the 3 doses of digoxin and 1 mg of Ativan, pt states that he feels better. Rates his depression at a 10, hopelessness at an 8 and his anxiety at a 7. Denies S I and HI. Pt's affect is flat and at times, when talking, Pt will start to cry. States, "I have been homeless for the last 25 years. My family wants nothing to do with me". Admits to increased anxiety accompanied by chest pain and discomfort. A) Given support, reassurance and praise. Provided with a 1:1. Monitored 1:1 when having some chest discomfort. Encouraged to go to the groups with the intent of decreasing his anxiety. Given encouragement. R) Pt was able to go to the groups and his chest pain decreased down to a 1. Cries easily when talking about his feelings.

## 2015-04-15 NOTE — Progress Notes (Signed)
Patient ID: Barry Daugherty, male   DOB: [birth year], 44 y.o.   MRN: [id number] Patient ID: Barry Daugherty, male   DOB: [birth year], 44 y.o.   MRN: [id number] [facility] MD Progress Note  04/15/2015 3:21 PM Barry Daugherty  MRN:  [id number]  Subjective:  Barry Daugherty reports, "My mood is improving, but I got chest pain. My chest is hurting pretty bad. I feel like something is sitting on my chest. I can feel the pain going to my left ar. I normally get chest pains, but it does not last this long"   Objective : I have discussed case with treatment team and have met with patient. Barry Daugherty says his mood is improving. Currently complaining of chest pains. Received NTG 0.4 mg x 2 & 1 mg Ativan prn. He denies medication side effects, feels medications are helping. Yesterday, had experienced some dizziness, but not today. Has been going to groups, visible on unit .Responds to support, encouragement, empathy. Affect improves partially during session. Will keep monitoring.  Principal Problem: MDD (major depressive disorder) ([place]) Diagnosis:   Patient Active Problem List   Diagnosis Date Noted  . MDD (major depressive disorder) ([place]) [F32.9] 04/12/2015  . Substance induced mood disorder ([place]) [F19.94] 04/04/2015  . Severe episode of recurrent major depressive disorder, without psychotic features ([place]) [F33.2]   . GAD (generalized anxiety disorder) [F41.1]    Total Time spent with patient: 15 minutes   Past Medical History:  Past Medical History  Diagnosis Date  . Seizures [facility])     Past Surgical History  Procedure Laterality Date  . Nephrectomy    . Cholecystectomy     Family History: History reviewed. No pertinent family history.  Social History:  History  Alcohol Use No    Comment: [facility]     History  Drug Use  . Yes  . Special: Cocaine, Methamphetamines    Comment: last took 4 - 5 days ago    Social History   Social History  . Marital Status: Single    Spouse Name: N/A  . Number of  Children: N/A  . Years of Education: N/A   Social History Main Topics  . Smoking status: Current Some Day Smoker    Types: Cigarettes  . Smokeless tobacco: None  . Alcohol Use: No     Comment: [facility]  . Drug Use: Yes    Special: Cocaine, Methamphetamines     Comment: last took 4 - 5 days ago  . Sexual Activity: Not Asked   Other Topics Concern  . None   Social History Narrative   Additional Social History:   Sleep:  Improved  Appetite:   Fair   Current Medications: Current Facility-Administered Medications  Medication Dose Route Frequency Provider Last Rate Last Dose  . acetaminophen (TYLENOL) tablet 650 mg  650 mg Oral Q6H PRN Jenne Campus, MD      . alum & mag hydroxide-simeth (MAALOX/MYLANTA) 200-200-20 MG/5ML suspension 30 mL  30 mL Oral Q4H PRN Jenne Campus, MD   30 mL at 04/15/15 0809  . amLODipine (NORVASC) tablet 10 mg  10 mg Oral Daily Jenne Campus, MD   10 mg at 04/15/15 0805  . atenolol (TENORMIN) tablet 50 mg  50 mg Oral Daily Jenne Campus, MD   50 mg at 04/15/15 0805  . FLUoxetine (PROZAC) capsule 20 mg  20 mg Oral Daily Jenne Campus, MD   20 mg at 04/15/15 0805  . gabapentin (NEURONTIN) capsule 300 mg  300 mg Oral TID WC & HS Encarnacion Slates, NP   300 mg at 04/15/15 1156  . LORazepam (ATIVAN) tablet 0.5 mg  0.5 mg Oral Q6H PRN Encarnacion Slates, NP      . magnesium hydroxide (MILK OF MAGNESIA) suspension 30 mL  30 mL Oral Daily PRN Jenne Campus, MD      . multivitamin with minerals tablet 1 tablet  1 tablet Oral Daily Jenne Campus, MD   1 tablet at 04/15/15 0805  . nitroGLYCERIN (NITROSTAT) SL tablet 0.4 mg  0.4 mg Sublingual Q5 min PRN Encarnacion Slates, NP   0.4 mg at 04/15/15 1018  . pantoprazole (PROTONIX) EC tablet 40 mg  40 mg Oral Daily Jenne Campus, MD   40 mg at 04/15/15 0805  . phenytoin (DILANTIN) ER capsule 300 mg  300 mg Oral QHS Jenne Campus, MD   300 mg at 04/14/15 2113  . thiamine (VITAMIN B-1) tablet 100 mg  100  mg Oral Daily Jenne Campus, MD   100 mg at 04/15/15 0805  . traZODone (DESYREL) tablet 100 mg  100 mg Oral QHS PRN Jenne Campus, MD   100 mg at 04/14/15 2113   Lab Results: No results found for this or any previous visit (from the past 48 hour(s)).  Physical Findings: AIMS: Facial and Oral Movements Muscles of Facial Expression: None, normal Lips and Perioral Area: None, normal Jaw: None, normal Tongue: None, normal,Extremity Movements Upper (arms, wrists, hands, fingers): None, normal Lower (legs, knees, ankles, toes): None, normal, Trunk Movements Neck, shoulders, hips: None, normal, Overall Severity Severity of abnormal movements (highest score from questions above): None, normal Incapacitation due to abnormal movements: None, normal Patient's awareness of abnormal movements (rate only patient's report): No Awareness, Dental Status Current problems with teeth and/or dentures?: No Does patient usually wear dentures?: No  CIWA:  CIWA-Ar Total: 3 COWS:     Musculoskeletal: Strength & Muscle Tone: within normal limits Gait & Station: normal Patient leans: N/A  Psychiatric Specialty Exam: ROS no seizures, no chest pain, no SOB, no rash   Blood pressure 115/74, pulse 81, temperature 97.8 F (36.6 C), temperature source Oral, resp. rate 18, height _0  (1.803 m), weight 106.142 kg (234 lb).Body mass index is 32.65 kg/(m^2).  General Appearance:  Improved grooming   Eye Contact::  Good  Speech:  Normal Rate  Volume:  Normal  Mood:  Depressed- but partially improved compared to admission  Affect:  Still constricted   Thought Process:  Linear  Orientation:  Full (Time, Place, and Person)  Thought Content:  denies hallucinations , no delusions   Suicidal Thoughts:  Describes some passive thoughts of death,but today denies any plan or intention of hurting self or of SI and is able to contract for safety on unit   Homicidal Thoughts:  No- specifically, also denies any  thoughts of hurting aunt or any other family member   Memory:  recent and remote grossly intact   Judgement:  Other:  improved   Insight:  Fair  Psychomotor Activity:  Complains of high anxiety levels.  Concentration:  Fair  Recall:  Good  Fund of Knowledge:Good  Language: Good  Akathisia:  Negative  Handed:  Right  AIMS (if indicated):     Assets:  Desire for Improvement Resilience  ADL's:  Intact  Cognition: WNL  Sleep:  Number of Hours: 6.75   Assessment - patient  Remains depressed, sad , particularly after an  upsetting phone conversation yesterday with an aunt, who was critical and unsupportive. Still reports crying easily, depression, passive thoughts of death, but denies any suicidal ideations. Does feel medications are helping, and did sleep better last night. History of seizure disorder but no seizures on unit ( on dilantin) . Tolerating medications ( Neurontin, Prozac, Dilantin) well. Complains of chest pains today, received NTG tablets, felt better.  Treatment Plan Summary: Daily contact with patient to assess and evaluate symptoms and progress in treatment, Medication management, Plan inpatient admission and medications as below Encourage  Milieu , group participation to work on coping skills and symptom reduction Continue Dilantin for history of Seizure Disorder Dilantin serum level 5.1  Continue Trazodone 100 mgrs QHS PRN for insomnia as needed  Continue Prozac 20 mgrs QDAY for depression Continue Neurontin 200 mgrs TID for anxiety Increased  Ativan to 1.0  mgrs Q 6 hours PRN for severe anxiety.   Lindell Spar I, [place], FNP-BC 04/15/2015, 3:21 PM I agree with assessment and plan Geralyn Flash A. Sabra Heck, M.D.

## 2015-04-15 NOTE — [facility] Group Notes (Signed)
[facility] Group Notes:  (Clinical Social Work)   04/15/2015 10:00-11:00AM  Summary of Progress/Problems: In today's process group a decisional balance exercise was used to explore in depth the perceived benefits and costs of unhealthy coping techniques, as well as the benefits and costs of replacing these with healthy coping skills. Among the unhealthy coping techniques listed by the group were drinking, isolating, overtaking pain medications, directing anger inward, trying to control everything, stopping medications, denying grief, rumination, and anger outbursts. Motivational Interviewing and the whiteboard were utilized for the exercises. The patient stated he needs to learn how to process his grief, as he has had 5 family members to die within the last 12 years, and has never dealt with any of them.  He was gone out of group to see a Radio broadcast assistant for awhile, and when he returned was very interactive.  Type of Therapy:  Group Therapy - Process   Participation Level:  Active  Participation Quality:  Attentive, Sharing and Supportive  Affect:  Blunted  Cognitive:  Appropriate and Oriented  Insight:  Engaged  Engagement in Therapy:  Engaged  Modes of Intervention:  Education, Motivational Interviewing  Ambrose Mantle, LCSW 04/15/2015, 12:33 PM

## 2015-04-15 NOTE — [facility] Group Notes (Signed)
[facility] Group Notes:  (Nursing/MHT/Case Management/Adjunct)  Date:  04/15/2015  Time:  2:27 PM  Type of Therapy:  Psychoeducational Skills  Participation Level:  Active  Participation Quality:  Appropriate  Affect:  Appropriate  Cognitive:  Appropriate  Insight:  Appropriate  Engagement in Group:  Engaged  Modes of Intervention:  Discussion  Summary of Progress/Problems: Pt did attend self inventory group.   Konstantin Lehnen Shanta 04/15/2015, 2:27 PM 

## 2015-04-16 NOTE — [facility] Group Notes (Signed)
[facility] Group Notes:  (Clinical Social Work)   04/16/2015    1:15-2:15PM  Summary of Progress/Problems:   The main focus of today's process group was to   1)  Discuss any resolutions or goals for the new year  2)  Identify current unhealthy supports and discuss how to set limits with them  3)  Discuss the importance of adding supports  An emphasis was placed on using counselor, doctor, therapy groups, and problem-specific support groups to expand supports.    The patient expressed full comprehension of the concepts presented, and agreed that there is a need to add more supports.  The patient stated that he has two goals for the coming year, 1) to get off the street and 2) to get into [employer] school.  He said that he has "basically" been homeless for 25 years.  He asked a lot of questions about getting disability.  He gave a good deal of feedback to others about seeking therapy and about supports/boundaries.  He described several negative interactions with his aunt.  Type of Therapy:  Process Group with Motivational Interviewing  Participation Level:  Active  Participation Quality:  Attentive, Monopolizing, Sharing and Supportive  Affect:  Blunted  Cognitive:  Appropriate and Oriented  Insight:  Engaged  Engagement in Therapy:  Engaged  Modes of Intervention:   Education, Support and Processing, Activity  Ambrose Mantle, LCSW 04/16/2015    3:19 PM

## 2015-04-16 NOTE — [facility] Group Notes (Addendum)

## 2015-04-16 NOTE — Progress Notes (Signed)
Patient ID: Jaysten Essner, male   DOB: 08/12/1970, 45 y.o.   MRN: [id number] Patient ID: Joncarlo Friberg, male   DOB: 1970/10/20, 45 y.o.   MRN: [id number] Patient ID: Senai Kingsley, male   DOB: 06/08/1970, 45 y.o.   MRN: [id number] [facility] MD Progress Note  04/16/2015 3:33 PM Hriday Stai  MRN:  [id number]  Subjective:  Herbie Baltimore reports, "I'm just trying to make myself feel better because I'm still feeling bad about my aunt cursing me out & calling stupid the other day. My new year resolution is to stay focus".  Objective : I have discussed case with treatment team and have met with patient. Iwao says his mood is sad. Still complaining of aunt cursing him out & calling him stupid the other day. He denies any chest pains today. He denies medication side effects, feels medications are helping. Has been going to groups, visible on unit. Responds to support, encouragement, empathy. Affect improves partially during session. Will keep monitoring.  Principal Problem: MDD (major depressive disorder) ([place]) Diagnosis:   Patient Active Problem List   Diagnosis Date Noted  . MDD (major depressive disorder) ([place]) [F32.9] 04/12/2015  . Substance induced mood disorder ([place]) [F19.94] 04/04/2015  . Severe episode of recurrent major depressive disorder, without psychotic features ([place]) [F33.2]   . GAD (generalized anxiety disorder) [F41.1]    Total Time spent with patient: 15 minutes   Past Medical History:  Past Medical History  Diagnosis Date  . Seizures [facility])     Past Surgical History  Procedure Laterality Date  . Nephrectomy    . Cholecystectomy     Family History: History reviewed. No pertinent family history.  Social History:  History  Alcohol Use No    Comment: [facility]     History  Drug Use  . Yes  . Special: Cocaine, Methamphetamines    Comment: last took 4 - 5 days ago    Social History   Social History  . Marital Status: Single    Spouse Name: N/A  . Number of Children: N/A   . Years of Education: N/A   Social History Main Topics  . Smoking status: Current Some Day Smoker    Types: Cigarettes  . Smokeless tobacco: None  . Alcohol Use: No     Comment: [facility]  . Drug Use: Yes    Special: Cocaine, Methamphetamines     Comment: last took 4 - 5 days ago  . Sexual Activity: Not Asked   Other Topics Concern  . None   Social History Narrative   Additional Social History:   Sleep:  Improved  Appetite:   Fair   Current Medications: Current Facility-Administered Medications  Medication Dose Route Frequency Provider Last Rate Last Dose  . acetaminophen (TYLENOL) tablet 650 mg  650 mg Oral Q6H PRN Jenne Campus, MD      . alum & mag hydroxide-simeth (MAALOX/MYLANTA) 200-200-20 MG/5ML suspension 30 mL  30 mL Oral Q4H PRN Jenne Campus, MD   30 mL at 04/16/15 0802  . amLODipine (NORVASC) tablet 10 mg  10 mg Oral Daily Jenne Campus, MD   10 mg at 04/16/15 0804  . atenolol (TENORMIN) tablet 50 mg  50 mg Oral Daily Jenne Campus, MD   50 mg at 04/16/15 0804  . FLUoxetine (PROZAC) capsule 20 mg  20 mg Oral Daily Jenne Campus, MD   20 mg at 04/16/15 0804  . gabapentin (NEURONTIN) capsule 300 mg  300 mg Oral  TID WC & HS Encarnacion Slates, NP   300 mg at 04/16/15 1200  . LORazepam (ATIVAN) tablet 0.5 mg  0.5 mg Oral Q6H PRN Encarnacion Slates, NP   0.5 mg at 04/16/15 1403  . magnesium hydroxide (MILK OF MAGNESIA) suspension 30 mL  30 mL Oral Daily PRN Jenne Campus, MD      . multivitamin with minerals tablet 1 tablet  1 tablet Oral Daily Jenne Campus, MD   1 tablet at 04/16/15 0804  . nitroGLYCERIN (NITROSTAT) SL tablet 0.4 mg  0.4 mg Sublingual Q5 min PRN Encarnacion Slates, NP   0.4 mg at 04/15/15 1023  . pantoprazole (PROTONIX) EC tablet 40 mg  40 mg Oral Daily Jenne Campus, MD   40 mg at 04/16/15 0804  . phenytoin (DILANTIN) ER capsule 300 mg  300 mg Oral QHS Jenne Campus, MD   300 mg at 04/15/15 2056  . thiamine (VITAMIN B-1) tablet 100  mg  100 mg Oral Daily Jenne Campus, MD   100 mg at 04/16/15 0804  . traZODone (DESYREL) tablet 100 mg  100 mg Oral QHS PRN Jenne Campus, MD   100 mg at 04/15/15 2056   Lab Results: No results found for this or any previous visit (from the past 48 hour(s)).  Physical Findings: AIMS: Facial and Oral Movements Muscles of Facial Expression: None, normal Lips and Perioral Area: None, normal Jaw: None, normal Tongue: None, normal,Extremity Movements Upper (arms, wrists, hands, fingers): None, normal Lower (legs, knees, ankles, toes): None, normal, Trunk Movements Neck, shoulders, hips: None, normal, Overall Severity Severity of abnormal movements (highest score from questions above): None, normal Incapacitation due to abnormal movements: None, normal Patient's awareness of abnormal movements (rate only patient's report): No Awareness, Dental Status Current problems with teeth and/or dentures?: No Does patient usually wear dentures?: No  CIWA:  CIWA-Ar Total: 7 COWS:     Musculoskeletal: Strength & Muscle Tone: within normal limits Gait & Station: normal Patient leans: N/A  Psychiatric Specialty Exam: ROS no seizures, no chest pain, no SOB, no rash   Blood pressure 125/75, pulse 78, temperature 97.8 F (36.6 C), temperature source Oral, resp. rate 18, height _0  (1.803 m), weight 106.142 kg (234 lb).Body mass index is 32.65 kg/(m^2).  General Appearance:  Improved grooming   Eye Contact::  Good  Speech:  Normal Rate  Volume:  Normal  Mood:  Depressed- but partially improved compared to admission  Affect:  Still constricted   Thought Process:  Linear  Orientation:  Full (Time, Place, and Person)  Thought Content:  denies hallucinations , no delusions   Suicidal Thoughts:  Describes some passive thoughts of death,but today denies any plan or intention of hurting self or of SI and is able to contract for safety on unit   Homicidal Thoughts:  No- specifically, also denies any  thoughts of hurting aunt or any other family member   Memory:  recent and remote grossly intact   Judgement:  Other:  improved   Insight:  Fair  Psychomotor Activity:  Complains of high anxiety levels.  Concentration:  Fair  Recall:  Good  Fund of Knowledge:Good  Language: Good  Akathisia:  Negative  Handed:  Right  AIMS (if indicated):     Assets:  Desire for Improvement Resilience  ADL's:  Intact  Cognition: WNL  Sleep:  Number of Hours: 6   Assessment - Aatient is still ruminating about an upsetting phone conversation  2 days ago with an aunt, who was critical and unsupportive. Still reports crying easily, depression, passive thoughts of death, but denies any suicidal ideations. Does feel medications are helping, and did sleep better last night. History of seizure disorder but no seizures on unit ( on dilantin) . Tolerating medications ( Neurontin, Prozac, Dilantin) well. Complains of chest pains today, received NTG tablets, felt better.  Treatment Plan Summary: Daily contact with patient to assess and evaluate symptoms and progress in treatment, Medication management, Plan inpatient admission and medications as below Encourage  Milieu , group participation to work on coping skills and symptom reduction Continue Dilantin for history of Seizure Disorder Dilantin serum level 5.1  Continue Trazodone 100 mgrs QHS PRN for insomnia as needed  Continue Prozac 20 mgrs QDAY for depression Continue Neurontin 200 mgrs TID for anxiety Increased  Ativan to 1.0  mgrs Q 6 hours PRN for severe anxiety.   Lindell Spar I, [place], FNP-BC 04/16/2015, 3:33 PM

## 2015-04-16 NOTE — Progress Notes (Signed)
Patient has been up in the dayroom laughing and talking with peers. He reports that he has made friends since being here and the group feels like his family now. He reports that he plans to go to [facility] rescue mission once discharged. He denies si/hi/a/v hallucinations. Support given and safety maintained on unit with 15 min checks. Patient is compliant with scheduled medications.

## 2015-04-16 NOTE — Progress Notes (Signed)
  D: This evening, patient with multiple somatic complaints. Pt states he "May start to have chest pains if I get anxious". Pt immediately forgot about any complaints when a peer walked by and he lost his train of thought. Pt with no further complaints after given prn sleep and anxiety medications. Pt participated in group session. A: Q 15 minute safety checks, encourage peer interaction, redirect behaviors as needed, administer medications as ordered by MD. R: Pt denies SI/HI or plans to harm himself this shift. No s/s of distress noted.

## 2015-04-16 NOTE — Progress Notes (Signed)
Patient ID: Barry Daugherty, male   DOB: 01/07/1971, 45 y.o.   MRN: [id number]   D: Pt has been very flat and depressed on the unit today. Pt reported this morning that he was having a lot of anxiety, and that he needed his Ativan. Pt has needed and requested Ativan every six hours. Pt reported that his depression was a 8, his hopelessness was a 9, and his anxiety was a 9. Pt reported that his goal for today was to work on his depression. Pt reported being negative SI/HI, no AH/VH noted. A: 15 min checks continued for patient safety. R: Pt safety maintained.

## 2015-04-17 MED ORDER — IBUPROFEN 600 MG PO TABS
600.0000 mg | ORAL_TABLET | Freq: Four times a day (QID) | ORAL | Status: DC | PRN
  Administered 2015-04-17 – 2015-04-20 (×6): 600 mg via ORAL
  Filled 2015-04-17 (×6): qty 1

## 2015-04-17 NOTE — Progress Notes (Signed)
Recreation Therapy Notes  Date: 01.02.2017 Time: 9:30am Location: 300 Group Room   Group Topic: Stress Management  Goal Area(s) Addresses:  Patient will actively participate in stress management techniques presented during session.   Behavioral Response: Did not attend.   Joydan Gretzinger L Jake Goodson, LRT/CTRS        Kristelle Cavallaro L 04/17/2015 10:19 AM 

## 2015-04-17 NOTE — Progress Notes (Signed)
Patient continues to c/o right sided abdominal pain radiating to his back.  Patient reported improved pain control with ibuprofen.  Patient has been attempting to go to ED.  Yesterday he was complaining of chest pain.  He has also requested his blood pressure be taken because "I feel it is high."  Last BP was wnl.  Patient was given tylenol and he states, "that doesn't work for me."  Patient has been observed in the day room laughing and talking to his peers.

## 2015-04-17 NOTE — [facility] Group Notes (Signed)
[facility] LCSW Group Therapy 04/17/2015  1:15 pm  Type of Therapy: Group Therapy Participation Level: Active  Participation Quality: Attentive, Sharing and Supportive  Affect: Depressed and Flat  Cognitive: Alert and Oriented  Insight: Developing/Improving and Engaged  Engagement in Therapy: Developing/Improving and Engaged  Modes of Intervention: Clarification, Confrontation, Discussion, Education, Exploration,  Limit-setting, Orientation, Problem-solving, Rapport Building, Dance movement psychotherapist, Socialization and Support  Summary of Progress/Problems: Pt identified obstacles faced currently and processed barriers involved in overcoming these obstacles. Pt identified steps necessary for overcoming these obstacles and explored motivation (internal and external) for facing these difficulties head on. Pt further identified one area of concern in their lives and chose a goal to focus on for today. Patient identified his depression, anxiety, drug use, strained family relationships, and lack of housing as obstacles. Patient discussed his interest in the [facility] and was provided with support and encouragement from the group.   Samuella Bruin, MSW, [employer] Clinical Social Worker [facility] [phone]

## 2015-04-17 NOTE — [facility] Group Notes (Signed)
   [facility] LCSW Aftercare Discharge Planning Group Note  04/17/2015  8:45 AM   Participation Quality: Alert, Appropriate and Oriented  Mood/Affect: Appropriate  Depression Rating: 8  Anxiety Rating: 9  Thoughts of Suicide: Pt denies SI/HI  Will you contract for safety? Yes  Current AVH: Pt denies  Plan for Discharge/Comments: Pt attended discharge planning group and actively participated in group. CSW provided pt with today's workbook. Patient is interested in discharging to the [facility] and following up with outpatient services.   Transportation Means: Pt denies access to transportation  Supports: No supports mentioned at this time  Samuella Bruin, MSW, [employer] Clinical Social Worker [employer] [phone]

## 2015-04-17 NOTE — Plan of Care (Signed)
Problem: Diagnosis: Increased Risk For Suicide Attempt Goal: STG-Patient Will Comply With Medication Regime Outcome: Progressing Patient has been compliant with all medications.

## 2015-04-17 NOTE — Progress Notes (Signed)
Adult Psychoeducational Group Note  Date:  04/17/2015 Time:  8:20 PM  Group Topic/Focus:  Wrap-Up Group:   The focus of this group is to help patients review their daily goal of treatment and discuss progress on daily workbooks.  Participation Level:  Active  Participation Quality:  Appropriate  Affect:  Appropriate  Cognitive:  Appropriate  Insight: Good  Engagement in Group:  Engaged  Modes of Intervention:  Discussion  Additional Comments:  Pt rated overall day a 10 out of 10 because of positive interactions with other patients on the unit throughout the day although he was having abdominal pains all day. Pt reported that he completed his goal for the day, which was to find a coping skill. Pt noted that lunch with other patients on the unit was the highlight of his day.   Cleotilde Neer 04/17/2015, 8:57 PM

## 2015-04-17 NOTE — Tx Team (Addendum)
Interdisciplinary Treatment Plan Update (Adult) Date: 04/17/2015   Date: 04/17/2015 8:49 AM  Progress in Treatment:  Attending groups: Yes  Participating in groups: Yes  Taking medication as prescribed: Yes  Tolerating medication: Yes  Family/Significant othe contact made: No patient has declined collateral contact Patient understands diagnosis: Yes, AEB seeking help with depression and substance abuse Discussing patient identified problems/goals with staff: Yes  Medical problems stabilized or resolved: Yes  Denies suicidal/homicidal ideation: Yes Patient has not harmed self or Others: Yes   New problem(s) identified: None identified at this time.   Discharge Plan or Barriers: Pt plans to discharge to [facility] to follow up with outpatient services.  Additional comments:  Patient and CSW reviewed pt's identified goals and treatment plan. Patient verbalized understanding and agreed to treatment plan. CSW reviewed [facility] "Discharge Process and Patient Involvement" Form. Pt verbalized understanding of information provided and signed form.   Reason for Continuation of Hospitalization:  Anxiety Depression Medication stabilization Suicidal ideation Withdrawal symptoms  Estimated length of stay: Discharge anticipated for 04/20/15  Review of initial/current patient goals per problem list:   1.  Goal(s): Patient will participate in aftercare plan  Met:  Yes  Target date: 3-5 days from date of admission   As evidenced by: Patient will participate within aftercare plan AEB aftercare provider and housing plan at discharge being identified.   04/12/15: Pt does not want to return to [facility]. He is requesting a referral to [facility] in [place]  1/2: Goal met. Patient plans to discharge to [facility] to follow up with outpatient services.  2.  Goal (s): Patient will exhibit decreased depressive symptoms and suicidal ideations.  Met:  Adequate for  discharge per MD  Target date: 3-5 days from date of admission   As evidenced by: Patient will utilize self rating of depression at 3 or below and demonstrate decreased signs of depression or be deemed stable for discharge by MD.  04/09/15: Pt rates depression at 10/10; denies SI. Continues to be tearful frequently  1/2: Goal progressing. Patient rates depression at 8 today, denies SI.   1/5: Adequate for discharge- Patient reports baseline depression and denies SI.  3.  Goal(s): Patient will demonstrate decreased signs and symptoms of anxiety.  Met:  Adequate for discharge per MD  Target date: 3-5 days from date of admission   As evidenced by: Patient will utilize self rating of anxiety at 3 or below and demonstrated decreased signs of anxiety, or be deemed stable for discharge by MD  04/12/15: Pt rates anxiety at 10/10  1/2: Goal Progressing. Patient rates anxiety at 9 today.  1/5: Adequate for discharge per MD. Patient reports baseline levels of anxiety.  4.  Goal(s): Patient will demonstrate decreased signs of withdrawal due to substance abuse  Met:  Yes  Target date: 3-5 days from date of admission   As evidenced by: Patient will produce a CIWA/COWS score of 0, have stable vitals signs, and no symptoms of withdrawal o 04/12/15: Pt has CIWA score of 13; endorses nausea, tactile disturbances, sweats, anxiety, and headache as main symptoms of withdrawal.    1/2: Goal progressing. Patient with CIWA score of 1, experiencing anxiety.    1/5: Goal met. No withdrawal symptoms reported at this time per medical chart.   Attendees: Patient:    Family:    Physician: Dr. Shea Evans, Dr. Sabra Heck  04/17/2015 9:30 AM  Nursing: Marcella Dubs, [facility], [place], RN 04/17/2015 9:30 AM  Clinical Social Worker: Tilden Fossa,  [place] 04/17/2015 9:30 AM  Other: Peri Maris, LCSW; [facility], LCSW  04/17/2015 9:30 AM   04/17/2015 9:30 AM  Other: Lars Pinks, Case Manager 04/17/2015  9:30 AM  Other:  Agustina Caroli, NP 04/17/2015 9:30 AM  Other:      Tilden Fossa, MSW, [place] Worker [facility] [phone]

## 2015-04-17 NOTE — Progress Notes (Signed)
D: Patient has been up in the milieu laughing and talking with his peers. He continues to complain of swollen ankles.  Advised patient to elevate his feet and apply an ice pack.  He become irritated with MHT stating, "Is anyone going to anything about my ankles?"  Patient has not complained of any chest pain today.  He denies any physical pain.  He rates his depression as a 9; hopelessness as an 8; anxiety as a 7.  He denies SI/HI/AVH.  He states his sleep and appetite are fair.  His energy level is normal and concentration is good.  Patient is pleased that he is part of a "good group" on [address]. He is attending groups and participating in his treatment. A: Continue to monitor medication management and MD orders.  Safety checks completed every 15 minutes per protocol.  Offer support and encouragement as needed. R: Patient is receptive to staff.

## 2015-04-17 NOTE — Plan of Care (Signed)
Problem: Alteration in mood Goal: LTG-Patient reports reduction in suicidal thoughts (Patient reports reduction in suicidal thoughts and is able to verbalize a safety plan for whenever patient is feeling suicidal)  Outcome: Progressing Pt denies SI at this time     

## 2015-04-17 NOTE — Progress Notes (Signed)
Patient ID: Barry Daugherty, male   DOB: 12/06/1970, 45 y.o.   MRN: [id number] Patient ID: Barry Daugherty, male   DOB: May 06, 1970, 45 y.o.   MRN: [id number] Patient ID: Barry Daugherty, male   DOB: 03/18/1971, 45 y.o.   MRN: [id number] Patient ID: Barry Daugherty, male   DOB: 1971/01/14, 45 y.o.   MRN: [id number] [facility] MD Progress Note  04/17/2015 1:52 PM Barry Daugherty  MRN:  [id number]  Subjective:  [name] reports, "I have been crying off & on all day long. I miss my family a lot. I had tried to call my grand-parents, but no one answered the phone. They went to [place] to see their son. My right side hurts when I touch it". Damarco currently denies any SIHI, AVH.  Objective : I have discussed case with treatment team and have met with patient. Barry Daugherty says he has been crying off & on all day today because he misses his family. He also complained of right side hurting. He has been ordered Ibuprofen 600 mg Q 6 hours prn. He denies medication side effects, feels medications are helping. Has been going to groups, visible on unit. Responds to support, encouragement, empathy. Affect improves partially during session. Will keep monitoring.  Principal Problem: MDD (major depressive disorder) ([place]) Diagnosis:   Patient Active Problem List   Diagnosis Date Noted  . MDD (major depressive disorder) ([place]) [F32.9] 04/12/2015  . Substance induced mood disorder ([place]) [F19.94] 04/04/2015  . Severe episode of recurrent major depressive disorder, without psychotic features ([place]) [F33.2]   . GAD (generalized anxiety disorder) [F41.1]    Total Time spent with patient: 15 minutes   Past Medical History:  Past Medical History  Diagnosis Date  . Seizures [facility])     Past Surgical History  Procedure Laterality Date  . Nephrectomy    . Cholecystectomy     Family History: History reviewed. No pertinent family history.  Social History:  History  Alcohol Use No    Comment: [facility]     History  Drug Use  . Yes   . Special: Cocaine, Methamphetamines    Comment: last took 4 - 5 days ago    Social History   Social History  . Marital Status: Single    Spouse Name: N/A  . Number of Children: N/A  . Years of Education: N/A   Social History Main Topics  . Smoking status: Current Some Day Smoker    Types: Cigarettes  . Smokeless tobacco: None  . Alcohol Use: No     Comment: [facility]  . Drug Use: Yes    Special: Cocaine, Methamphetamines     Comment: last took 4 - 5 days ago  . Sexual Activity: Not Asked   Other Topics Concern  . None   Social History Narrative   Additional Social History:   Sleep:  Improved  Appetite:   Fair   Current Medications: Current Facility-Administered Medications  Medication Dose Route Frequency Provider Last Rate Last Dose  . acetaminophen (TYLENOL) tablet 650 mg  650 mg Oral Q6H PRN Jenne Campus, MD      . alum & mag hydroxide-simeth (MAALOX/MYLANTA) 200-200-20 MG/5ML suspension 30 mL  30 mL Oral Q4H PRN Jenne Campus, MD   30 mL at 04/16/15 0802  . amLODipine (NORVASC) tablet 10 mg  10 mg Oral Daily Jenne Campus, MD   10 mg at 04/17/15 0803  . atenolol (TENORMIN) tablet 50 mg  50 mg Oral Daily Fernando A Cobos,  MD   50 mg at 04/17/15 0803  . FLUoxetine (PROZAC) capsule 20 mg  20 mg Oral Daily Jenne Campus, MD   20 mg at 04/17/15 0803  . gabapentin (NEURONTIN) capsule 300 mg  300 mg Oral TID WC & HS Encarnacion Slates, NP   300 mg at 04/17/15 1146  . LORazepam (ATIVAN) tablet 0.5 mg  0.5 mg Oral Q6H PRN Encarnacion Slates, NP   0.5 mg at 04/16/15 2134  . magnesium hydroxide (MILK OF MAGNESIA) suspension 30 mL  30 mL Oral Daily PRN Jenne Campus, MD      . multivitamin with minerals tablet 1 tablet  1 tablet Oral Daily Jenne Campus, MD   1 tablet at 04/17/15 0803  . nitroGLYCERIN (NITROSTAT) SL tablet 0.4 mg  0.4 mg Sublingual Q5 min PRN Encarnacion Slates, NP   0.4 mg at 04/15/15 1023  . pantoprazole (PROTONIX) EC tablet 40 mg  40 mg Oral Daily  Jenne Campus, MD   40 mg at 04/17/15 0803  . phenytoin (DILANTIN) ER capsule 300 mg  300 mg Oral QHS Jenne Campus, MD   300 mg at 04/16/15 2134  . thiamine (VITAMIN B-1) tablet 100 mg  100 mg Oral Daily Jenne Campus, MD   100 mg at 04/17/15 0803  . traZODone (DESYREL) tablet 100 mg  100 mg Oral QHS PRN Jenne Campus, MD   100 mg at 04/16/15 2134   Lab Results: No results found for this or any previous visit (from the past 48 hour(s)).  Physical Findings: AIMS: Facial and Oral Movements Muscles of Facial Expression: None, normal Lips and Perioral Area: None, normal Jaw: None, normal Tongue: None, normal,Extremity Movements Upper (arms, wrists, hands, fingers): None, normal Lower (legs, knees, ankles, toes): None, normal, Trunk Movements Neck, shoulders, hips: None, normal, Overall Severity Severity of abnormal movements (highest score from questions above): None, normal Incapacitation due to abnormal movements: None, normal Patient's awareness of abnormal movements (rate only patient's report): No Awareness, Dental Status Current problems with teeth and/or dentures?: No Does patient usually wear dentures?: No  CIWA:  CIWA-Ar Total: 1 COWS:     Musculoskeletal: Strength & Muscle Tone: within normal limits Gait & Station: normal Patient leans: N/A  Psychiatric Specialty Exam: Review of Systems  Constitutional: Negative.   HENT: Negative.   Eyes: Negative.   Respiratory: Negative.   Cardiovascular: Negative.        Hx. Chest pains  Gastrointestinal: Negative.   Genitourinary: Negative.   Musculoskeletal: Positive for myalgias.  Skin: Negative.   Neurological: Negative.   Endo/Heme/Allergies: Negative.   Psychiatric/Behavioral: Positive for depression ("Improving") and substance abuse. Negative for suicidal ideas, hallucinations and memory loss. The patient is nervous/anxious (Improving) and has insomnia (Improving).    no seizures, no chest pain, no SOB, no rash    Blood pressure 134/103, pulse 102, temperature 98.1 F (36.7 C), temperature source Other (Comment), resp. rate 20, height 5' 11"  (1.803 m), weight 106.142 kg (234 lb).Body mass index is 32.65 kg/(m^2).  General Appearance:  Improved grooming   Eye Contact::  Good  Speech:  Normal Rate  Volume:  Normal  Mood:  Depressed- but partially improved compared to admission  Affect:  Still constricted   Thought Process:  Linear  Orientation:  Full (Time, Place, and Person)  Thought Content:  denies hallucinations , no delusions   Suicidal Thoughts:  Describes some passive thoughts of death,but today denies any plan or intention  of hurting self or of SI and is able to contract for safety on unit   Homicidal Thoughts:  No- specifically, also denies any thoughts of hurting aunt or any other family member   Memory:  recent and remote grossly intact   Judgement:  Other:  improved   Insight:  Fair  Psychomotor Activity:  Complains of high anxiety levels.  Concentration:  Fair  Recall:  Good  Fund of Knowledge:Good  Language: Good  Akathisia:  Negative  Handed:  Right  AIMS (if indicated):     Assets:  Desire for Improvement Resilience  ADL's:  Intact  Cognition: WNL  Sleep:  Number of Hours: 6.25   Assessment - Patient is ruminating about how he misses his family, although he says they very critical of him & unsupportive. He says he's been crying off & on today but denies any suicidal ideations. Does feel medications are helping, and did sleep sleep well. History of seizure disorder but no seizures on unit ( on dilantin). Tolerating medications ( Neurontin, Prozac, Dilantin) well. Complains of right side pains today, received Ibuprofen.  Treatment Plan Summary: Daily contact with patient to assess and evaluate symptoms and progress in treatment, Medication management, Plan inpatient admission and medications as below Encourage  Milieu , group participation to work on coping skills and symptom  reduction Continue Dilantin for history of Seizure Disorder Dilantin serum level 5.1  Continue Trazodone 100 mgrs QHS PRN for insomnia as needed  Continue Prozac 20 mgrs QDAY for depression Continue Neurontin 200 mgrs TID for anxiety Increased  Ativan to 1.0  mgrs Q 6 hours PRN for severe anxiety.  Ibuprofen 600 mg Q 6 hours prn for pain.  Lindell Spar I, [place], FNP-BC 04/17/2015, 1:52 PM I agree with assessment and plan Geralyn Flash A. Sabra Heck, M.D.

## 2015-04-17 NOTE — Progress Notes (Signed)
D: Pt denies SI/HI/AVH. "I feel great". Pt complained about side pain, pt informed that he has ibuprofen ordered . " I wonder if I can go to the hospital to get it checked out". Pt plans to go to [facility] on D/C.   A: Pt was offered support and encouragement. Pt was given scheduled medications. Pt was encourage to attend groups. Q 15 minute checks were done for safety.   R:  Pt is taking medication. Pt receptive to treatment and safety maintained on unit.;

## 2015-04-17 NOTE — Plan of Care (Signed)
Problem: Diagnosis: Increased Risk For Suicide Attempt Goal: LTG-Patient Will Report Improved Mood and Deny Suicidal LTG (by discharge) Patient will report improved mood and deny suicidal ideation.  Outcome: Progressing Patient currently denies suicidal ideations and has been up in the dayroom laughing and talking with peers tonight.

## 2015-04-18 ENCOUNTER — Encounter (HOSPITAL_COMMUNITY): Payer: Self-pay | Admitting: Emergency Medicine

## 2015-04-18 ENCOUNTER — Emergency Department (HOSPITAL_COMMUNITY): Admission: EM | Admit: 2015-04-18 | Discharge: 2015-04-18 | Discharge status: Absent without leave | Payer: Self-pay

## 2015-04-18 ENCOUNTER — Inpatient Hospital Stay (HOSPITAL_COMMUNITY): Payer: Federal, State, Local not specified - Other

## 2015-04-18 LAB — COMPREHENSIVE METABOLIC PANEL
ALT: 31 U/L (ref 17–63)
AST: 24 U/L (ref 15–41)
Albumin: 4 g/dL (ref 3.5–5.0)
Alkaline Phosphatase: 71 U/L (ref 38–126)
Anion gap: 9 (ref 5–15)
BUN: 17 mg/dL (ref 6–20)
CALCIUM: 8.9 mg/dL (ref 8.9–10.3)
CHLORIDE: 105 mmol/L (ref 101–111)
CO2: 23 mmol/L (ref 22–32)
CREATININE: 1.15 mg/dL (ref 0.61–1.24)
Glucose, Bld: 182 mg/dL — ABNORMAL HIGH (ref 65–99)
Potassium: 4.2 mmol/L (ref 3.5–5.1)
Sodium: 137 mmol/L (ref 135–145)
Total Bilirubin: 0.5 mg/dL (ref 0.3–1.2)
Total Protein: 7.3 g/dL (ref 6.5–8.1)

## 2015-04-18 LAB — CBC WITH DIFFERENTIAL/PLATELET
Basophils Absolute: 0.1 10*3/uL (ref 0.0–0.1)
Basophils Relative: 1 %
EOS PCT: 3 %
Eosinophils Absolute: 0.2 10*3/uL (ref 0.0–0.7)
HCT: 42.3 % (ref 39.0–52.0)
Hemoglobin: 14.5 g/dL (ref 13.0–17.0)
LYMPHS ABS: 3 10*3/uL (ref 0.7–4.0)
LYMPHS PCT: 33 %
MCH: 31.6 pg (ref 26.0–34.0)
MCHC: 34.3 g/dL (ref 30.0–36.0)
MCV: 92.2 fL (ref 78.0–100.0)
MONO ABS: 0.7 10*3/uL (ref 0.1–1.0)
MONOS PCT: 7 %
Neutro Abs: 5.2 10*3/uL (ref 1.7–7.7)
Neutrophils Relative %: 56 %
PLATELETS: 368 10*3/uL (ref 150–400)
RBC: 4.59 MIL/uL (ref 4.22–5.81)
RDW: 13.6 % (ref 11.5–15.5)
WBC: 9.2 10*3/uL (ref 4.0–10.5)

## 2015-04-18 MED ORDER — GI COCKTAIL ~~LOC~~
30.0000 mL | Freq: Once | ORAL | Status: AC
  Administered 2015-04-19: 30 mL via ORAL
  Filled 2015-04-18: qty 30

## 2015-04-18 MED ORDER — SODIUM CHLORIDE 0.9 % IV BOLUS (SEPSIS)
1000.0000 mL | Freq: Once | INTRAVENOUS | Status: AC
  Administered 2015-04-19: 1000 mL via INTRAVENOUS

## 2015-04-18 MED ORDER — PROMETHAZINE HCL 25 MG/ML IJ SOLN
25.0000 mg | Freq: Once | INTRAMUSCULAR | Status: AC
  Administered 2015-04-19: 25 mg via INTRAVENOUS
  Filled 2015-04-18: qty 1

## 2015-04-18 MED ORDER — MORPHINE SULFATE (PF) 4 MG/ML IV SOLN
4.0000 mg | Freq: Once | INTRAVENOUS | Status: DC
  Administered 2015-04-19: 4 mg via INTRAVENOUS
  Filled 2015-04-18: qty 1

## 2015-04-18 NOTE — Plan of Care (Signed)
Problem: Consults Goal: Depression Patient Education See Patient Education Module for education specifics.  Outcome: Progressing Nurse discussed depression/coping skills with patient.        

## 2015-04-18 NOTE — Progress Notes (Signed)
D: Patient states he had a good day but states he was having right flank pain.  Patient was sent to [facility] at the beginning of the shift and was given medications.  Patient reports pain has decreased.  Patient states he has been coloring to get his mind off of things and states it has been relaxing.  Patient denies SI/HI and denies AVH. A: Staff to monitor Q 15 mins for safety.  Encouragement and support offered.  Scheduled medications administered per orders.  Patient taking administered medications. R: Patient remains safe on the unit.  Patient did not attendgroup tonight he was off the unit.  Patient taking administered mediations.

## 2015-04-18 NOTE — Progress Notes (Signed)
Recreation Therapy Notes  Animal-Assisted Activity (AAA) Program Checklist/Progress Notes Patient Eligibility Criteria Checklist & Daily Group note for Rec Tx Intervention  Date: 01.03.2017 Time: 2:45pm Location: [address]    AAA/T Program Assumption of Risk Form signed by Patient/ or Parent Legal Guardian yes  Patient is free of allergies or sever asthma yes  Patient reports no fear of animals yes  Patient reports no history of cruelty to animals yes  Patient understands his/her participation is voluntary yes  Patient washes hands before animal contact yes  Patient washes hands after animal contact yes  Behavioral Response: Appropriate   Education: Hand Washing, Appropriate Animal Interaction   Education Outcome: Acknowledges education.   Clinical Observations/Feedback: Patient engaged appropriately with therapy dog, petting him from floor level and peers during session.   Marykay Lex Shanita Kanan, LRT/CTRS  Luvinia Lucy L 04/18/2015 2:04 PM

## 2015-04-18 NOTE — ED Provider Notes (Signed)
CSN: [id number]     Arrival date & time 04/18/15  2013 History   First MD Initiated Contact with Patient 04/18/15 2301     Chief Complaint  Patient presents with  . Abdominal Pain     HPI   Barry Daugherty is an 45 y.o. male sent from [facility] for evaluation of abdominal pain. He states his pain started two days ago. Reports the pain started in the umbilical area, radiated around to his lower right back, and now back across his upper abdomen. He endorses nausea but denies emesis. Endorses several episodes of watery, non-bloody diarrhea over the two days. He is unsure if he has had a fever. Denies dysuria, urinary frequency/urgency.   Past Medical History  Diagnosis Date  . Seizures (HCC)   . Brain injury [facility])    Past Surgical History  Procedure Laterality Date  . Nephrectomy    . Cholecystectomy     History reviewed. No pertinent family history. Social History  Substance Use Topics  . Smoking status: Current Some Day Smoker    Types: Cigarettes  . Smokeless tobacco: None  . Alcohol Use: No     Comment: [facility]    Review of Systems  All other systems reviewed and are negative.     Allergies  Ceftin and Reglan  Home Medications   Prior to Admission medications   Medication Sig Start Date End Date Taking? Authorizing Provider  amLODipine (NORVASC) 10 MG tablet Take 1 tablet (10 mg total) by mouth daily. 04/04/15   Beau Fanny, FNP  atenolol (TENORMIN) 50 MG tablet Take 1 tablet (50 mg total) by mouth daily. 04/04/15   Beau Fanny, FNP  gabapentin (NEURONTIN) 100 MG capsule Take 1 capsule (100 mg total) by mouth 4 (four) times daily. 04/04/15   Beau Fanny, FNP  hydrOXYzine (ATARAX/VISTARIL) 50 MG tablet Take 1 tablet (50 mg total) by mouth every 6 (six) hours as needed for anxiety. 04/04/15   Beau Fanny, FNP  pantoprazole (PROTONIX) 40 MG tablet Take 40 mg by mouth daily.    Historical Provider, MD  phenytoin (DILANTIN) 300 MG ER capsule Take 1 capsule (300 mg  total) by mouth at bedtime. 04/04/15   Beau Fanny, FNP  traZODone (DESYREL) 50 MG tablet Take 1 tablet (50 mg total) by mouth at bedtime as needed for sleep. 04/04/15   Everardo All [place], FNP   BP 129/84 mmHg  Pulse 82  Temp(Src) 98.2 F (36.8 C) (Oral)  Resp 18  Ht  (1.803 m)  Wt 104.327 kg  BMI 32.09 kg/m2  SpO2 97% Physical Exam  Constitutional: He is oriented to person, place, and time.  Prior to my initial eval pt appears comfortably resting in bed. During our exam/discussion he began grimacing and moaning in pain.  HENT:  Right Ear: External ear normal.  Left Ear: External ear normal.  Nose: Nose normal.  Mouth/Throat: Oropharynx is clear and moist. No oropharyngeal exudate.  Eyes: Conjunctivae and EOM are normal. Pupils are equal, round, and reactive to light.  Neck: Normal range of motion. Neck supple.  Cardiovascular: Normal rate, regular rhythm, normal heart sounds and intact distal pulses.   Pulmonary/Chest: Effort normal and breath sounds normal. No respiratory distress. He has no wheezes. He exhibits no tenderness.  Abdominal: Soft. Bowel sounds are normal. He exhibits no distension. There is generalized tenderness. There is no rebound and no guarding.  Musculoskeletal: He exhibits no edema.  Neurological: He is alert and  oriented to person, place, and time. No cranial nerve deficit.  Skin: Skin is warm and dry.  Psychiatric: He has a normal mood and affect.  Nursing note and vitals reviewed.   ED Course  Procedures (including critical care time) Labs Review Labs Reviewed  PHENYTOIN LEVEL, FREE AND TOTAL - Abnormal; Notable for the following:    Phenytoin, Total 5.1 (*)    All other components within normal limits  COMPREHENSIVE METABOLIC PANEL - Abnormal; Notable for the following:    Glucose, Bld 182 (*)    All other components within normal limits  CBC WITH DIFFERENTIAL/PLATELET  URINALYSIS, ROUTINE W REFLEX MICROSCOPIC (NOT AT [facility])    Imaging  Review Dg Abd 1 View  04/18/2015  CLINICAL DATA:  Pt c/o diffuse pain/swelling to abd, w/nausea x 2 days. EXAM: ABDOMEN - 1 VIEW COMPARISON:  04/08/2015 FINDINGS: The bowel gas pattern is normal. No radio-opaque calculi or other significant radiographic abnormality are seen. Multiple surgical clips are identified within the upper abdomen. IMPRESSION: No evidence for acute  abnormality. Electronically Signed   By: Norva Pavlov M.D.   On: 04/18/2015 21:07   I have personally reviewed and evaluated these images and lab results as part of my medical decision-making.   EKG Interpretation None      MDM   Final diagnoses:  Generalized abdominal pain    Pt has some generalized abdominal tenderness on exam but no rebound or guarding. Abdomen is not peritoneal. His afebrile and not tachycardic. His labs are unremarkable. Acute abdomen x-ray negative.  I suspect viral etiology of pt's symptoms. I see no indication for emergent CT at this time. I have low suspicion for appendicitis, pancreatitis, SBO, cholecystitis, or other acute intra-abdominal pathology. Given history of polysubstance abuse will hold off on narcotics for now. Will give NS bolus, phenergan, and GI cocktail.   Apparently I did not cancel the order for morphine and pt received morphine in addition to meds listed above. He reports minimal improvement in symptoms. I discussed with him that I cannot give him anymore pain meds at this time. His labs are unremarkable. He is afebrile, not tachycardic, and not tachypneic. With distraction he has no abdominal tenderness. He has had no episodes of emesis or diarrhea in the ED. He is medically stable. I will transfer him back to [facility].   Carlene Coria, PA-C 04/19/15 0107  Nelva Nay, MD 05/03/15 [phone]

## 2015-04-18 NOTE — Progress Notes (Signed)
D:  Patient's self inventory sheet, patient sleeps fair, sleep medication is helpful.  Fair appetite, low energy level, good concentration.  Rated depression 9, hopeless 8, anxiety 7.  Withdrawals, diarrhea, cravings, nausea.  Denied SI.  Physical problems "The right side of my stomach hurts to the touch."  Pain, worst pain in past 24 hours #10.  "The right side of my stomach."  No pain medication.  Goal is to work on his depression.  Plans to talk to someone.  Feels he needs to go to the hospital.  Does have discharge plans.  Believes he will not follow up with discharge information because of himself. A:  Medications administered per MD orders.  Emotional support and encouragement. R:  Denied SI and HI, contracts for safety.  Denied visual hallucinations.  Stated he does hear voices calling his name.

## 2015-04-18 NOTE — ED Notes (Signed)
Pt sent here from [facility] with c/o abd pain  Pt states his pain started 2 nights ago and he has nausea and diarrhea  Pt states it hurts all over and is tender to touch  Pt states the pain radiates around to his back  Denies vomiting

## 2015-04-18 NOTE — [facility] Group Notes (Signed)

## 2015-04-18 NOTE — Progress Notes (Addendum)
Patient ID: Barry Daugherty, male   DOB: 08-25-1970, 45 y.o.   MRN: [id number] [facility] MD Progress Note  04/18/2015 11:25 AM Barry Daugherty  MRN:  [id number]  Subjective:  Patient reports that he is still depressed, sad and ruminating about losses of loved ones. He also describes abdominal discomfort , particularly on R LQ and R flank . This pain started 2-3 days ago, has persisted, possibly worsened, but denies chills or fever, denies vomiting, denies diarrhea, (+) bowel movements ,  and has been able to  Eat well without worsening symptoms. He does report some subjective sense of dysuria . Denies medication side effects.   Objective : I have discussed case with treatment team and have met with patient.  Patient reports some improvement compared to admission but as above, continues to report depression, sadness, and sense of grief/loss. Denies current suicidal ideations. Denies psychotic symptoms and does not appear internally preoccupied . Denies medication side effects. Tolerating medications well. No disruptive or agitated behaviors on unit, going to groups . Patient has history of seizure disorder- on Dilantin- no seizure episodes on unit.   Principal Problem: MDD (major depressive disorder) ([place]) Diagnosis:   Patient Active Problem List   Diagnosis Date Noted  . MDD (major depressive disorder) ([place]) [F32.9] 04/12/2015  . Substance induced mood disorder ([place]) [F19.94] 04/04/2015  . Severe episode of recurrent major depressive disorder, without psychotic features ([place]) [F33.2]   . GAD (generalized anxiety disorder) [F41.1]    Total Time spent with patient: 20 minutes    Past Medical History:  Past Medical History  Diagnosis Date  . Seizures [facility])     Past Surgical History  Procedure Laterality Date  . Nephrectomy    . Cholecystectomy     Family History: History reviewed. No pertinent family history.  Social History:  History  Alcohol Use No    Comment: [facility]      History  Drug Use  . Yes  . Special: Cocaine, Methamphetamines    Comment: last took 4 - 5 days ago    Social History   Social History  . Marital Status: Single    Spouse Name: N/A  . Number of Children: N/A  . Years of Education: N/A   Social History Main Topics  . Smoking status: Current Some Day Smoker    Types: Cigarettes  . Smokeless tobacco: None  . Alcohol Use: No     Comment: [facility]  . Drug Use: Yes    Special: Cocaine, Methamphetamines     Comment: last took 4 - 5 days ago  . Sexual Activity: Not Asked   Other Topics Concern  . None   Social History Narrative   Additional Social History:   Sleep:  Improved  Appetite:   Fair   Current Medications: Current Facility-Administered Medications  Medication Dose Route Frequency Provider Last Rate Last Dose  . acetaminophen (TYLENOL) tablet 650 mg  650 mg Oral Q6H PRN Jenne Campus, MD   650 mg at 04/17/15 1840  . alum & mag hydroxide-simeth (MAALOX/MYLANTA) 200-200-20 MG/5ML suspension 30 mL  30 mL Oral Q4H PRN Jenne Campus, MD   30 mL at 04/16/15 0802  . amLODipine (NORVASC) tablet 10 mg  10 mg Oral Daily Jenne Campus, MD   10 mg at 04/18/15 0800  . atenolol (TENORMIN) tablet 50 mg  50 mg Oral Daily Myer Peer Cobos, MD   50 mg at 04/18/15 0800  . FLUoxetine (PROZAC) capsule 20 mg  20 mg Oral Daily Jenne Campus, MD   20 mg at 04/18/15 0800  . gabapentin (NEURONTIN) capsule 300 mg  300 mg Oral TID WC & HS Encarnacion Slates, NP   300 mg at 04/18/15 0801  . ibuprofen (ADVIL,MOTRIN) tablet 600 mg  600 mg Oral Q6H PRN Encarnacion Slates, NP   600 mg at 04/18/15 0330  . LORazepam (ATIVAN) tablet 0.5 mg  0.5 mg Oral Q6H PRN Encarnacion Slates, NP   0.5 mg at 04/18/15 0807  . magnesium hydroxide (MILK OF MAGNESIA) suspension 30 mL  30 mL Oral Daily PRN Jenne Campus, MD      . multivitamin with minerals tablet 1 tablet  1 tablet Oral Daily Jenne Campus, MD   1 tablet at 04/18/15 0801  . nitroGLYCERIN  (NITROSTAT) SL tablet 0.4 mg  0.4 mg Sublingual Q5 min PRN Encarnacion Slates, NP   0.4 mg at 04/15/15 1023  . pantoprazole (PROTONIX) EC tablet 40 mg  40 mg Oral Daily Jenne Campus, MD   40 mg at 04/18/15 0802  . phenytoin (DILANTIN) ER capsule 300 mg  300 mg Oral QHS Jenne Campus, MD   300 mg at 04/17/15 2119  . thiamine (VITAMIN B-1) tablet 100 mg  100 mg Oral Daily Jenne Campus, MD   100 mg at 04/18/15 0802  . traZODone (DESYREL) tablet 100 mg  100 mg Oral QHS PRN Jenne Campus, MD   100 mg at 04/17/15 2119   Lab Results: No results found for this or any previous visit (from the past 48 hour(s)).  Physical Findings: AIMS: Facial and Oral Movements Muscles of Facial Expression: None, normal Lips and Perioral Area: None, normal Jaw: None, normal Tongue: None, normal,Extremity Movements Upper (arms, wrists, hands, fingers): None, normal Lower (legs, knees, ankles, toes): None, normal, Trunk Movements Neck, shoulders, hips: None, normal, Overall Severity Severity of abnormal movements (highest score from questions above): None, normal Incapacitation due to abnormal movements: None, normal Patient's awareness of abnormal movements (rate only patient's report): No Awareness, Dental Status Current problems with teeth and/or dentures?: No Does patient usually wear dentures?: No  CIWA:  CIWA-Ar Total: 1 COWS:  COWS Total Score: 2  Musculoskeletal: Strength & Muscle Tone: within normal limits Gait & Station: normal Patient leans: N/A  Psychiatric Specialty Exam: Review of Systems  Constitutional: Negative.   HENT: Negative.   Eyes: Negative.   Respiratory: Negative.   Cardiovascular: Negative.        Hx. Chest pains  Gastrointestinal: Negative.   Genitourinary: Negative.   Musculoskeletal: Positive for myalgias.  Skin: Negative.   Neurological: Negative.   Endo/Heme/Allergies: Negative.   Psychiatric/Behavioral: Positive for depression ("Improving") and substance  abuse. Negative for suicidal ideas, hallucinations and memory loss. The patient is nervous/anxious (Improving) and has insomnia (Improving).    LRQ abdominal pain x 2-3 days , no fever, no chills, no vomiting, no anorexia.  Reports subjective sense of dysuria , " but only sometimes ".   Blood pressure 120/81, pulse 90, temperature 97.8 F (36.6 C), temperature source Other (Comment), resp. rate 16, height 5' 11"  (1.803 m), weight 234 lb (106.142 kg).Body mass index is 32.65 kg/(m^2).  General Appearance:  Improved grooming   Eye Contact::  Good  Speech:  Normal Rate  Volume:  Normal  Mood:   Remains depressed , and affect is constricted, although more reactive than on admission  Affect:  Some improvement, smiles briefly at times  Thought Process:  Linear  Orientation:  Full (Time, Place, and Person)  Thought Content:  denies hallucinations , no delusions   Suicidal Thoughts: denies any plan or intention of hurting self or of SI and is able to contract for safety on unit   Homicidal Thoughts:  No  Memory:  recent and remote grossly intact   Judgement:  Other:  improved   Insight:  Fair  Psychomotor Activity:   Normal   Concentration:  Fair  Recall:  Good  Fund of Knowledge:Good  Language: Good  Akathisia:  Negative  Handed:  Right  AIMS (if indicated):     Assets:  Desire for Improvement Resilience  ADL's:  Intact  Cognition: WNL  Sleep:  Number of Hours: 5.75   Assessment - patient reports ongoing depression, sadness, sense of grief and loss. Denies any  current SI and no current psychotic symptoms noted . Has been visible on unit, pleasant on approach. Tolerating medications well. Of note, reports LRQ pain x 2 days, without associated symptoms, except for mild , intermittent dysuria- no hematuria reported .Marland Kitchen   Treatment Plan Summary: Daily contact with patient to assess and evaluate symptoms and progress in treatment, Medication management, Plan inpatient admission and  medications as below Encourage  Milieu , group participation to work on coping skills and symptom reduction Continue Dilantin for history of Seizure Disorder Continue Trazodone 100 mgrs QHS PRN for insomnia as needed  Continue Prozac 20 mgrs QDAY for depression Continue Neurontin 200 mgrs TID for anxiety Continue  Ativan to 1  mgrs Q 6 hours PRN for severe anxiety.  Ibuprofen 600 mg Q 6 hours prn for pain. Treatment team working on disposition planning options Flank  pain and vague urinary symptoms-  Will order CBC, Diff, BMP,  UA , KUB    COBOS, FERNANDO,  MD  04/18/2015, 11:25 AM

## 2015-04-18 NOTE — Progress Notes (Signed)
Patient accompanied to [facility] with [facility] via [facility].  Paient is able to ambulate.

## 2015-04-18 NOTE — Progress Notes (Signed)
UA specimen cup given to patient for specimen.

## 2015-04-18 NOTE — Progress Notes (Signed)
[facility] called to give report on patient that is to be sent to the ED for right abdominal and flack pain evaluation.  [facility] called also for transport.

## 2015-04-18 NOTE — Progress Notes (Signed)
CSW spoke with [facility] staff who report that patient will have to come pick up his belongings at discharge and that they will not be able to bring belongings to the hospital.  Samuella Bruin, MSW, [facility] Clinical Social Worker [facility] [phone]

## 2015-04-19 LAB — URINALYSIS, ROUTINE W REFLEX MICROSCOPIC
Bilirubin Urine: NEGATIVE
Glucose, UA: NEGATIVE mg/dL
Hgb urine dipstick: NEGATIVE
KETONES UR: NEGATIVE mg/dL
LEUKOCYTES UA: NEGATIVE
NITRITE: NEGATIVE
PH: 5.5 (ref 5.0–8.0)
Protein, ur: NEGATIVE mg/dL
SPECIFIC GRAVITY, URINE: 1.029 (ref 1.005–1.030)

## 2015-04-19 MED ORDER — LAMOTRIGINE 25 MG PO TABS
25.0000 mg | ORAL_TABLET | Freq: Two times a day (BID) | ORAL | Status: DC
  Administered 2015-04-19 – 2015-04-20 (×2): 25 mg via ORAL
  Filled 2015-04-19: qty 1
  Filled 2015-04-19 (×2): qty 14
  Filled 2015-04-19 (×3): qty 1

## 2015-04-19 NOTE — Progress Notes (Signed)
Recreation Therapy Notes  Date: 01.04.2017 Time: 9:30am Location: 300 Hall Group Room   Group Topic: Stress Management  Goal Area(s) Addresses:  Patient will actively participate in stress management techniques presented during session.   Behavioral Response: Appropriate   Intervention: Stress management techniques  Activity :  Deep Breathing and Guided Imagery. LRT provided instruction and demonstration on practice of Guided Imagery. Technique was coupled with deep breathing.   Education:  Stress Management, Discharge Planning.   Education Outcome: Acknowledges education  Clinical Observations/Feedback: Patient actively engaged in technique introduced, expressed no concerns and demonstrated ability to practice independently post d/c.   Marykay Lex Enaya Howze, LRT/CTRS        Jearl Klinefelter 04/19/2015 12:53 PM

## 2015-04-19 NOTE — ED Notes (Signed)
Report called to [place] at [facility] adult unit.  VSS.  PT sts feels better.

## 2015-04-19 NOTE — Progress Notes (Signed)
D:Patient in his room on approach.  Patient states h had a good day.  Patient states he no longer is having abdominal pain.  Patient states he hopes to be discharged tomorrow and hopes he can get to [place].  Patient states he feels there are more resources for him there.  Patient states his depression is much less today.  Patient denies SI/HI and denies AVH.  Patient does appear to have a brighter affect tonight. A: Staff to monitor Q 15 mins for safety.  Encouragement and support offered.  Scheduled medications administered per orders.  Patient educated on Lamictal and Clinical research associate explained to patient to alert staff immediately if he develops a rash.  Ativan administered prn for anxiety.  Trazodone administered prn for sleep.   R: Patient remains safe on the unit.  Patient attended group tonight.  Patient visible on the unit and interacting with peers.  Patient taking administered medications.

## 2015-04-19 NOTE — Progress Notes (Signed)
Patient ID: Barry Daugherty, male   DOB: 1970/05/14, 45 y.o.   MRN: [id number] [facility] MD Progress Note  04/19/2015  Barry Daugherty  MRN:  [id number]  Subjective: "I have been crying a lot and I can't seem to stop. Also, I have told many people about my history of excessive energy, and being awake for several days in a row with lots of energy and cleaning and organizing my house."  Objective: Pt seen and chart reviewed. Pt is alert/oriented x4, calm, cooperative, and appropriate to situation. Pt denies suicidal/homicidal ideation and psychosis and does not appear to be responding to internal stimuli. Pt does report that he continues to cry during the day and that he is concerned about this. He reports that he feels much better overall, but that he does not know why he is crying this way.   Principal Problem: MDD (major depressive disorder) ([place]) Diagnosis:   Patient Active Problem List   Diagnosis Date Noted  . Substance induced mood disorder ([place]) [F19.94] 04/04/2015    Priority: High  . Severe episode of recurrent major depressive disorder, without psychotic features ([place]) [F33.2]     Priority: High  . GAD (generalized anxiety disorder) [F41.1]     Priority: High  . MDD (major depressive disorder) ([place]) [F32.9] 04/12/2015   Total Time spent with patient: 15 minutes   Past Medical History:  Past Medical History  Diagnosis Date  . Seizures ([place])   . Brain injury [facility])     Past Surgical History  Procedure Laterality Date  . Nephrectomy    . Cholecystectomy     Family History: History reviewed. No pertinent family history.  Social History:  History  Alcohol Use No    Comment: [facility]     History  Drug Use  . Yes  . Special: Cocaine, Marijuana    Comment: last used 2 weeks ago     Social History   Social History  . Marital Status: Single    Spouse Name: N/A  . Number of Children: N/A  . Years of Education: N/A   Social History Main Topics  . Smoking status: Current  Some Day Smoker    Types: Cigarettes  . Smokeless tobacco: None  . Alcohol Use: No     Comment: [facility]  . Drug Use: Yes    Special: Cocaine, Marijuana     Comment: last used 2 weeks ago   . Sexual Activity: Not Asked   Other Topics Concern  . None   Social History Narrative   Additional Social History:   Sleep:  Improved  Appetite:   Fair   Current Medications: Current Facility-Administered Medications  Medication Dose Route Frequency Provider Last Rate Last Dose  . acetaminophen (TYLENOL) tablet 650 mg  650 mg Oral Q6H PRN Jenne Campus, MD   650 mg at 04/18/15 1444  . alum & mag hydroxide-simeth (MAALOX/MYLANTA) 200-200-20 MG/5ML suspension 30 mL  30 mL Oral Q4H PRN Jenne Campus, MD   30 mL at 04/16/15 0802  . amLODipine (NORVASC) tablet 10 mg  10 mg Oral Daily Jenne Campus, MD   10 mg at 04/19/15 0851  . atenolol (TENORMIN) tablet 50 mg  50 mg Oral Daily Jenne Campus, MD   50 mg at 04/19/15 0850  . FLUoxetine (PROZAC) capsule 20 mg  20 mg Oral Daily Jenne Campus, MD   20 mg at 04/19/15 0851  . gabapentin (NEURONTIN) capsule 300 mg  300 mg Oral TID  WC & HS Encarnacion Slates, NP   300 mg at 04/19/15 1451  . ibuprofen (ADVIL,MOTRIN) tablet 600 mg  600 mg Oral Q6H PRN Encarnacion Slates, NP   600 mg at 04/18/15 1845  . lamoTRIgine (LAMICTAL) tablet 25 mg  25 mg Oral BID Benjamine Mola, FNP   25 mg at 04/19/15 1451  . LORazepam (ATIVAN) tablet 0.5 mg  0.5 mg Oral Q6H PRN Encarnacion Slates, NP   0.5 mg at 04/19/15 0854  . magnesium hydroxide (MILK OF MAGNESIA) suspension 30 mL  30 mL Oral Daily PRN Jenne Campus, MD      . multivitamin with minerals tablet 1 tablet  1 tablet Oral Daily Jenne Campus, MD   1 tablet at 04/19/15 0851  . nitroGLYCERIN (NITROSTAT) SL tablet 0.4 mg  0.4 mg Sublingual Q5 min PRN Encarnacion Slates, NP   0.4 mg at 04/15/15 1023  . pantoprazole (PROTONIX) EC tablet 40 mg  40 mg Oral Daily Jenne Campus, MD   40 mg at 04/19/15 0850  .  phenytoin (DILANTIN) ER capsule 300 mg  300 mg Oral QHS Myer Peer Cobos, MD   300 mg at 04/19/15 0011  . thiamine (VITAMIN B-1) tablet 100 mg  100 mg Oral Daily Jenne Campus, MD   100 mg at 04/19/15 0851  . traZODone (DESYREL) tablet 100 mg  100 mg Oral QHS PRN Jenne Campus, MD   100 mg at 04/17/15 2119   Lab Results:  Results for orders placed or performed during the hospital encounter of 04/11/15 (from the past 48 hour(s))  CBC with Differential/Platelet     Status: None   Collection Time: 04/18/15  6:32 PM  Result Value Ref Range   WBC 9.2 4.0 - 10.5 K/uL   RBC 4.59 4.22 - 5.81 MIL/uL   Hemoglobin 14.5 13.0 - 17.0 g/dL   HCT 42.3 39.0 - 52.0 %   MCV 92.2 78.0 - 100.0 fL   MCH 31.6 26.0 - 34.0 pg   MCHC 34.3 30.0 - 36.0 g/dL   RDW 13.6 11.5 - 15.5 %   Platelets 368 150 - 400 K/uL   Neutrophils Relative % 56 %   Neutro Abs 5.2 1.7 - 7.7 K/uL   Lymphocytes Relative 33 %   Lymphs Abs 3.0 0.7 - 4.0 K/uL   Monocytes Relative 7 %   Monocytes Absolute 0.7 0.1 - 1.0 K/uL   Eosinophils Relative 3 %   Eosinophils Absolute 0.2 0.0 - 0.7 K/uL   Basophils Relative 1 %   Basophils Absolute 0.1 0.0 - 0.1 K/uL    Comment: Performed at [facility]  Comprehensive metabolic panel     Status: Abnormal   Collection Time: 04/18/15  6:32 PM  Result Value Ref Range   Sodium 137 135 - 145 mmol/L   Potassium 4.2 3.5 - 5.1 mmol/L   Chloride 105 101 - 111 mmol/L   CO2 23 22 - 32 mmol/L   Glucose, Bld 182 (H) 65 - 99 mg/dL   BUN 17 6 - 20 mg/dL   Creatinine, Ser 1.15 0.61 - 1.24 mg/dL   Calcium 8.9 8.9 - 10.3 mg/dL   Total Protein 7.3 6.5 - 8.1 g/dL   Albumin 4.0 3.5 - 5.0 g/dL   AST 24 15 - 41 U/L   ALT 31 17 - 63 U/L   Alkaline Phosphatase 71 38 - 126 U/L   Total Bilirubin 0.5 0.3 - 1.2 mg/dL  GFR calc non Af Amer >60 >60 mL/min   GFR calc Af Amer >60 >60 mL/min    Comment: (NOTE) The eGFR has been calculated using the CKD EPI equation. This calculation has not  been validated in all clinical situations. eGFR's persistently <60 mL/min signify possible Chronic Kidney Disease.    Anion gap 9 5 - 15    Comment: Performed at [facility]  Urinalysis, Routine w reflex microscopic (not at [facility])     Status: Abnormal   Collection Time: 04/19/15  6:46 AM  Result Value Ref Range   Color, Urine YELLOW YELLOW   APPearance CLOUDY (A) CLEAR   Specific Gravity, Urine 1.029 1.005 - 1.030   pH 5.5 5.0 - 8.0   Glucose, UA NEGATIVE NEGATIVE mg/dL   Hgb urine dipstick NEGATIVE NEGATIVE   Bilirubin Urine NEGATIVE NEGATIVE   Ketones, ur NEGATIVE NEGATIVE mg/dL   Protein, ur NEGATIVE NEGATIVE mg/dL   Nitrite NEGATIVE NEGATIVE   Leukocytes, UA NEGATIVE NEGATIVE    Comment: MICROSCOPIC NOT DONE ON URINES WITH NEGATIVE PROTEIN, BLOOD, LEUKOCYTES, NITRITE, OR GLUCOSE <1000 mg/dL. Performed at [facility]     Physical Findings: AIMS: Facial and Oral Movements Muscles of Facial Expression: None, normal Lips and Perioral Area: None, normal Jaw: None, normal Tongue: None, normal,Extremity Movements Upper (arms, wrists, hands, fingers): None, normal Lower (legs, knees, ankles, toes): None, normal, Trunk Movements Neck, shoulders, hips: None, normal, Overall Severity Severity of abnormal movements (highest score from questions above): None, normal Incapacitation due to abnormal movements: None, normal Patient's awareness of abnormal movements (rate only patient's report): No Awareness, Dental Status Current problems with teeth and/or dentures?: No Does patient usually wear dentures?: No  CIWA:  CIWA-Ar Total: 1 COWS:  COWS Total Score: 2  Musculoskeletal: Strength & Muscle Tone: within normal limits Gait & Station: normal Patient leans: N/A  Psychiatric Specialty Exam: Review of Systems  Constitutional: Negative.   HENT: Negative.   Eyes: Negative.   Respiratory: Negative.   Cardiovascular: Negative.        Hx. Chest  pains  Gastrointestinal: Negative.   Genitourinary: Negative.   Musculoskeletal: Positive for myalgias.  Skin: Negative.   Neurological: Negative.   Endo/Heme/Allergies: Negative.   Psychiatric/Behavioral: Positive for depression ("Improving") and substance abuse. Negative for suicidal ideas, hallucinations and memory loss. The patient is nervous/anxious (Improving) and has insomnia (Improving).   All other systems reviewed and are negative.  LRQ abdominal pain x 2-3 days , no fever, no chills, no vomiting, no anorexia.  Reports subjective sense of dysuria , " but only sometimes ".   Blood pressure 128/79, pulse 90, temperature 97.6 F (36.4 C), temperature source Oral, resp. rate 16, height _0  (1.803 m), weight 104.327 kg (230 lb), SpO2 97 %.Body mass index is 32.09 kg/(m^2).  General Appearance:  Improved grooming   Eye Contact::  Good  Speech:  Normal Rate  Volume:  Normal  Mood:   Remains depressed , and affect is constricted, although more reactive than on admission  Affect:  Some improvement, smiles briefly at times , somewhat tearful during day but OK during assessment.   Thought Process:  Linear  Orientation:  Full (Time, Place, and Person)  Thought Content:  denies hallucinations , no delusions   Suicidal Thoughts: denies any plan or intention of hurting self or of SI and is able to contract for safety on unit   Homicidal Thoughts:  No  Memory:  recent and remote grossly intact  Judgement:  Other:  improved   Insight:  Fair yet improving   Psychomotor Activity:   Normal   Concentration:  Fair  Recall:  Good  Fund of Knowledge:Good  Language: Good  Akathisia:  Negative  Handed:  Right  AIMS (if indicated):     Assets:  Desire for Improvement Resilience  ADL's:  Intact  Cognition: WNL  Sleep:  Number of Hours: 4   Assessment - patient reports ongoing depression, sadness, sense of grief and loss. He is continuing to improve overall yet has intermittent crying  spells. Will continue plan as below, it is working well, but will add Lamictal 67m bid for mood stability with 227mtrial run and continue if no rash.   Treatment Plan Summary: Daily contact with patient to assess and evaluate symptoms and progress in treatment, Medication management, Plan inpatient admission and medications as below Encourage  Milieu , group participation to work on coping skills and symptom reduction Continue Dilantin for history of Seizure Disorder Continue Trazodone 100 mgrs QHS PRN for insomnia as needed  Continue Prozac 20 mgrs QDAY for depression Continue Neurontin 200 mgrs TID for anxiety Continue  Ativan to 1  mgrs Q 6 hours PRN for severe anxiety.  Ibuprofen 600 mg Q 6 hours prn for pain. Treatment team working on disposition planning options Flank  pain and vague urinary symptoms-  Will order CBC, Diff, BMP, UA, KUB   -Start Lamictal 2564mid (1 dose to assess for rash, then continue if no reaction).   WitBenjamine MolaNP-BC  04/19/2015, 2:00PM Agree with NP Progress Note, as above

## 2015-04-19 NOTE — [facility] Group Notes (Addendum)
[facility] LCSW Group Therapy 04/19/2015  1:15 PM Type of Therapy: Group Therapy Participation Level: Active  Participation Quality: Attentive, Sharing and Supportive  Affect: Depressed and tearful  Cognitive: Alert and Oriented  Insight: Developing/Improving and Engaged  Engagement in Therapy: Developing/Improving and Engaged  Modes of Intervention: Clarification, Confrontation, Discussion, Education, Exploration, Limit-setting, Orientation, Problem-solving, Rapport Building, Dance movement psychotherapist, Socialization and Support  Summary of Progress/Problems: The topic for group today was emotional regulation. This group focused on both positive and negative emotion identification and allowed group members to process ways to identify feelings, regulate negative emotions, and find healthy ways to manage internal/external emotions. Group members were asked to reflect on a time when their reaction to an emotion led to a negative outcome and explored how alternative responses using emotion regulation would have benefited them. Group members were also asked to discuss a time when emotion regulation was utilized when a negative emotion was experienced. Patient discussed how his step-mother has been an important influence in his life and also shared about an incident in the cafeteria that was frustrating for him. CSW and other group members provided patient with emotional support and encouragement.  Samuella Bruin, MSW, [employer] Clinical Social Worker [facility] [phone]

## 2015-04-19 NOTE — Progress Notes (Addendum)
Patient arrived back from the ED.  Patient given something to drink and his medications.  Patient states he is drowsy and appears drowsy.  Patient denies SI/HI and denies AVH.  Patient states pain in minimal and states it feels better.

## 2015-04-19 NOTE — ED Notes (Signed)
Report given to Morrie Sheldon, RN and [facility] called for transportation to [facility]

## 2015-04-19 NOTE — Progress Notes (Signed)
Adult Psychoeducational Group Note  Date:  04/19/2015 Time:  9:13 PM  Group Topic/Focus:  Wrap-Up Group:   The focus of this group is to help patients review their daily goal of treatment and discuss progress on daily workbooks.  Participation Level:  Active  Participation Quality:  Appropriate  Affect:  Appropriate  Cognitive:  Alert  Insight: Appropriate  Engagement in Group:  Engaged  Modes of Intervention:  Discussion  Additional Comments:  Patient stated his goal for today was to get a hold of his depression. On a scale from 1-10, patient rated his day as a 2 because he did not get much rest. Patient then rated his day as a 9 because he loves socializing with other patients.   Wilma Wuthrich L Jodie Leiner 04/19/2015, 9:13 PM

## 2015-04-19 NOTE — Progress Notes (Signed)
D: Pt presents with flat affect and depressed mood. Pt rates depression 9/10. Hopeless 8/10. Anxiety 9/10. Pt denies suicidal thoughts and verbally contracts for safety. Pt verbalized to Clinical research associate that he is still grieving the loss of his mother, father and uncle. Pt requested ativan this morning due to feeling increasingly anxious. Pt stated goal is to work on his depression today. Pt compliant with tx. Pt denies any adverse reactions to meds.  A: Medications administered as ordered per MD. Verbal support given. Pt encouraged to attend groups. 15 minute checks performed for safety. R: Pt receptive to tx.

## 2015-04-19 NOTE — BHH Group Notes (Signed)
   [facility] LCSW Aftercare Discharge Planning Group Note  04/19/2015  8:45 AM   Participation Quality: Alert, Appropriate and Oriented  Mood/Affect: Depressed and Flat  Depression Rating: 10  Anxiety Rating: 10  Thoughts of Suicide: Pt denies SI/HI  Will you contract for safety? Yes  Current AVH: Pt denies  Plan for Discharge/Comments: Pt attended discharge planning group and actively participated in group. CSW provided pt with today's workbook. Patient reports not feeling well this morning due to having dreams about relapse. He continues to have high symptoms of depression and anxiety. He continues to be interested it the [employer].   Transportation Means: Pt denies access to transportation  Supports: No supports mentioned at this time  Samuella Bruin, MSW, [employer] Clinical Social Worker [employer] [phone]

## 2015-04-20 LAB — GLUCOSE, CAPILLARY: Glucose-Capillary: 116 mg/dL — ABNORMAL HIGH (ref 65–99)

## 2015-04-20 MED ORDER — FLUOXETINE HCL 20 MG PO CAPS: 20.0000 mg | ORAL_CAPSULE | Freq: Every day | ORAL | Status: DC

## 2015-04-20 MED ORDER — LAMOTRIGINE 25 MG PO TABS: 25.0000 mg | ORAL_TABLET | Freq: Two times a day (BID) | ORAL | Status: DC

## 2015-04-20 MED ORDER — TRAZODONE HCL 100 MG PO TABS: 100.0000 mg | ORAL_TABLET | Freq: Every evening | ORAL | Status: DC | PRN

## 2015-04-20 MED ORDER — AMLODIPINE BESYLATE 10 MG PO TABS: 10.0000 mg | ORAL_TABLET | Freq: Every day | ORAL | Status: DC

## 2015-04-20 MED ORDER — GABAPENTIN 300 MG PO CAPS: 300.0000 mg | ORAL_CAPSULE | Freq: Three times a day (TID) | ORAL | Status: DC

## 2015-04-20 MED ORDER — PANTOPRAZOLE SODIUM 40 MG PO TBEC: 40.0000 mg | DELAYED_RELEASE_TABLET | Freq: Every day | ORAL | Status: DC

## 2015-04-20 MED ORDER — ATENOLOL 50 MG PO TABS: 50.0000 mg | ORAL_TABLET | Freq: Every day | ORAL | Status: DC

## 2015-04-20 MED ORDER — PHENYTOIN SODIUM EXTENDED 300 MG PO CAPS: 300.0000 mg | ORAL_CAPSULE | Freq: Every day | ORAL | Status: DC

## 2015-04-20 NOTE — Discharge Summary (Signed)
Physician Discharge Summary Note  Patient:  Barry Daugherty is an 45 y.o., male MRN:  [id number] DOB:  [birth year] Patient phone:  [phone] (home)  Patient address:   [place] [address],  Total Time spent with patient: 45 minutes  Date of Admission:  04/11/2015 Date of Discharge: 05/02/2015  Reason for Admission:   45 year old man, who reports worsening depression over the last month. States he has lost several loved ones over recent years [ mother passed away 12 years ago, father passed away 5 years ago, uncle passed away 4 years ago, among others ], and feels holiday season heightens his sense of loss and grief .  Recently he has had some suicidal ideations, such as thinking about overdosing on medications or walking into traffic.  Reports abusing cocaine and cannabis in binges, when available- last used 10 days ago. In addition to depression, endorses anxiety, to include a sense of free floating anxiety and history of panic attacks. States he had " a real bad panic attack" yesterday which caused him to go to the ED.   Principal Problem: MDD (major depressive disorder) [facility]) Discharge Diagnoses: Patient Active Problem List   Diagnosis Date Noted  . Substance induced mood disorder (HCC) [F19.94] 04/04/2015    Priority: High  . Severe episode of recurrent major depressive disorder, without psychotic features (HCC) [F33.2]     Priority: High  . GAD (generalized anxiety disorder) [F41.1]     Priority: High  . MDD (major depressive disorder) (HCC) [F32.9] 04/12/2015    Past Psychiatric History: See H&P  Past Medical History:  Past Medical History  Diagnosis Date  . Seizures (HCC)   . Brain injury [facility])     Past Surgical History  Procedure Laterality Date  . Nephrectomy    . Cholecystectomy     Family History: History reviewed. No pertinent family history. Family Psychiatric  History: See H&P Social History:  History  Alcohol Use No    Comment: [facility]     History  Drug Use  . Yes  . Special: Cocaine, Marijuana    Comment: last used 2 weeks ago     Social History   Social History  . Marital Status: Single    Spouse Name: N/A  . Number of Children: N/A  . Years of Education: N/A   Social History Main Topics  . Smoking status: Current Some Day Smoker    Types: Cigarettes  . Smokeless tobacco: None  . Alcohol Use: No     Comment: [facility]  . Drug Use: Yes    Special: Cocaine, Marijuana     Comment: last used 2 weeks ago   . Sexual Activity: Not Asked   Other Topics Concern  . None   Social History Narrative    Hospital Course:   [name] was admitted for MDD (major depressive disorder) (HCC), and crisis management.  Pt was treated discharged with the medications listed below under Medication List.  Medical problems were identified and treated as needed.  Home medications were restarted as appropriate.  Improvement was monitored by observation and [name] 's daily report of symptom reduction.  Emotional and mental status was monitored by daily self-inventory reports completed by [name] and clinical staff.         [name] was evaluated by the treatment team for stability and plans for continued recovery upon discharge. [name] 's motivation was an integral factor for scheduling further treatment. Employment, transportation, bed availability,  health status, family support, and any pending legal issues were also considered during hospital stay. Pt was offered further treatment options upon discharge including but not limited to Residential, Intensive Outpatient, and Outpatient treatment.  [name] will follow up with the services as listed below under Follow Up Information.     Upon completion of this admission the patient was both mentally and medically stable for discharge denying suicidal/homicidal ideation, auditory/visual/tactile hallucinations, delusional thoughts and  paranoia.   [name] responded well to treatment with Prozac, Neurontin, Lamictal, Trazodone. Pt demonstrated improvement without reported or observed adverse effects to the point of stability appropriate for outpatient management. Pertinent labs include: UDS + benzo. Reviewed CBC, CMP, BAL, and UDS; all unremarkable aside from noted exceptions.   Physical Findings: AIMS: Facial and Oral Movements Muscles of Facial Expression: None, normal Lips and Perioral Area: None, normal Jaw: None, normal Tongue: None, normal,Extremity Movements Upper (arms, wrists, hands, fingers): None, normal Lower (legs, knees, ankles, toes): None, normal, Trunk Movements Neck, shoulders, hips: None, normal, Overall Severity Severity of abnormal movements (highest score from questions above): None, normal Incapacitation due to abnormal movements: None, normal Patient's awareness of abnormal movements (rate only patient's report): No Awareness, Dental Status Current problems with teeth and/or dentures?: No Does patient usually wear dentures?: No  CIWA:  CIWA-Ar Total: 1 COWS:  COWS Total Score: 2  Musculoskeletal: Strength & Muscle Tone: within normal limits Gait & Station: normal Patient leans: N/A  Psychiatric Specialty Exam: Review of Systems  Psychiatric/Behavioral: Positive for depression and substance abuse. Negative for suicidal ideas and hallucinations. The patient is nervous/anxious and has insomnia.   All other systems reviewed and are negative.   Blood pressure 120/72, pulse 110, temperature 98.6 F (37 C), temperature source Oral, resp. rate 18, height 5\' 11"  (1.803 m), weight 104.327 kg (230 lb), SpO2 97 %.Body mass index is 32.09 kg/(m^2).  SEE MD PSE within the SRA   Have you used any form of tobacco in the last 30 days? (Cigarettes, Smokeless Tobacco, Cigars, and/or Pipes): No  Has this patient used any form of tobacco in the last 30 days? (Cigarettes, Smokeless Tobacco, Cigars,  and/or Pipes) No  Metabolic Disorder Labs:  No results found for: HGBA1C, MPG No results found for: PROLACTIN No results found for: CHOL, TRIG, HDL, CHOLHDL, VLDL, LDLCALC  See Psychiatric Specialty Exam and Suicide Risk Assessment completed by Attending Physician prior to discharge.  Discharge destination:  Home  Is patient on multiple antipsychotic therapies at discharge:  No   Has Patient had three or more failed trials of antipsychotic monotherapy by history:  No  Recommended Plan for Multiple Antipsychotic Therapies: NA     Medication List    ASK your doctor about these medications      Indication   amLODipine 10 MG tablet  Commonly known as:  NORVASC  Take 1 tablet (10 mg total) by mouth daily.   Indication:  High Blood Pressure     atenolol 50 MG tablet  Commonly known as:  TENORMIN  Take 1 tablet (50 mg total) by mouth daily.   Indication:  High Blood Pressure     gabapentin 100 MG capsule  Commonly known as:  NEURONTIN  Take 1 capsule (100 mg total) by mouth 4 (four) times daily.   Indication:  Neuropathic Pain     hydrOXYzine 50 MG tablet  Commonly known as:  ATARAX/VISTARIL  Take 1 tablet (50 mg total) by mouth every 6 (six) hours as needed  for anxiety.   Indication:  Anxiety Neurosis     pantoprazole 40 MG tablet  Commonly known as:  PROTONIX  Take 40 mg by mouth daily.      phenytoin 300 MG ER capsule  Commonly known as:  DILANTIN  Take 1 capsule (300 mg total) by mouth at bedtime.   Indication:  Seizure     traZODone 50 MG tablet  Commonly known as:  DESYREL  Take 1 tablet (50 mg total) by mouth at bedtime as needed for sleep.   Indication:  Trouble Sleeping           Follow-up Information    Follow up with [facility]  On 04/24/2015.   Why:  Assessment for therapy and medication management services on Monday January 9th between 9am to 3pm. Please arrive early in the morning with a letter from shelter stating that you are residing there in  order to be seen as soon as possible.   Contact information:   [address] [place] [address] [phone] Fax [phone]      Follow-up recommendations:  Activity:  As tolerated Diet:  Heart healthy with low sodium.  Comments:   Take all medications as prescribed. Keep all follow-up appointments as scheduled.  Do not consume alcohol or use illegal drugs while on prescription medications. Report any adverse effects from your medications to your primary care provider promptly.  In the event of recurrent symptoms or worsening symptoms, call 911, a crisis hotline, or go to the nearest emergency department for evaluation.   Signed: Beau Fanny, FNP-BC 04/20/2015, 11:26 AM   Patient seen, Suicide Assessment Completed.  Disposition Plan Reviewed

## 2015-04-20 NOTE — Progress Notes (Signed)
  [facility] Adult Case Management Discharge Plan :  Will you be returning to the same living situation after discharge:  No.Patient plans to go to [facility] At discharge, do you have transportation home?: Yes,  patient provided with train ticket Do you have the ability to pay for your medications: Yes,  patient provided with prescriptions at discharge  Release of information consent forms completed and in the chart;  Patient's signature needed at discharge.  Patient to Follow up at: Follow-up Information    Follow up with [facility]  On 04/24/2015.   Why:  Assessment for therapy and medication management services on Monday January 9th between 9am to 3pm. Please arrive early in the morning with a letter from shelter stating that you are residing there in order to be seen as soon as possible.   Contact information:   [address]  [place] [address] [phone] Fax [phone]      Next level of care provider has access to [facility] Link:no  Safety Planning and Suicide Prevention discussed: Yes,  with patient  Have you used any form of tobacco in the last 30 days? (Cigarettes, Smokeless Tobacco, Cigars, and/or Pipes): No  Has patient been referred to the Quitline?: N/A patient is not a smoker  Patient has been referred for addiction treatment: Yes- outpatient  Veatrice Eckstein, [place] 04/20/2015, 11:27 AM

## 2015-04-20 NOTE — Progress Notes (Signed)
Discharge note: Pt received both written and verbal discharge instructions. Pt verbalized understanding of discharge instructions. Pt agreed to f/u appt and med regimen. Pt received sample meds, prescriptions and belongings. Pt safely discharged to the lobby.

## 2015-04-20 NOTE — Plan of Care (Signed)
Problem: Diagnosis: Increased Risk For Suicide Attempt Goal: LTG-Patient Will Report Improved Mood and Deny Suicidal LTG (by discharge) Patient will report improved mood and deny suicidal ideation.  Outcome: Progressing Patient has brighter affect.  Patient denies SI/HI and denies AVH.

## 2015-04-20 NOTE — BHH Suicide Risk Assessment (Addendum)
[facility] Discharge Suicide Risk Assessment   Demographic Factors:  45 year old male, single, homeless, unemployed    Total Time spent with patient: 30 minutes  Musculoskeletal: Strength & Muscle Tone: within normal limits Gait & Station: normal Patient leans: N/A  Psychiatric Specialty Exam: Physical Exam  ROS  Blood pressure 120/72, pulse 110, temperature 98.6 F (37 C), temperature source Oral, resp. rate 18, height  (1.803 m), weight 230 lb (104.327 kg), SpO2 97 %.Body mass index is 32.09 kg/(m^2).  General Appearance: improved grooming   Eye Contact::  improved  Speech:  Normal Rate409  Volume:  Normal  Mood:  improved, denies feeling depressed today, affect brighter  Affect:  more reactive   Thought Process:  Linear  Orientation:  Other:  fully alert and attentive   Thought Content:  no hallucinations, no delusions, not internally preoccupied   Suicidal Thoughts:  No- at this time denies any suicidal or homicidal ideations  Homicidal Thoughts:  No  Memory:  recent and remote grossly intact   Judgement:  Other:  improved compared to admission  Insight:  improving  Psychomotor Activity:  Normal  Concentration:  Good  Recall:  Good  Fund of Knowledge:Good  Language: Good  Akathisia:  Negative  Handed:  Right  AIMS (if indicated):     Assets:  Desire for Improvement Resilience  Sleep:  Number of Hours: 6.25  Cognition: WNL  ADL's:  Intact   Have you used any form of tobacco in the last 30 days? (Cigarettes, Smokeless Tobacco, Cigars, and/or Pipes): No  Has this patient used any form of tobacco in the last 30 days? (Cigarettes, Smokeless Tobacco, Cigars, and/or Pipes) No  Mental Status Per Nursing Assessment::   On Admission:     Current Mental Status by Physician: Patient currently improved compared to admission presentation- presents fully alert, attentive, with improved mood and range of affect, no current SI or HI, no psychotic symptoms endorsed or noted at  this time. Future oriented.  Loss Factors: Unemployment, limited support network, homelessness   Historical Factors: History of depression, history of suicide attempt in the past   Risk Reduction Factors:   Positive coping skills or problem solving skills  Continued Clinical Symptoms:  As noted, currently significantly improved  Patient reports recent abdominal pain currently resolved and does not present in any acute distress or discomfort prior to discharge Patient has history of seizure disorder- no seizures on unit   Cognitive Features That Contribute To Risk:  No gross cognitive deficits noted upon discharge. Is alert , attentive, and oriented x 3   Suicide Risk:  Mild:  Suicidal ideation of limited frequency, intensity, duration, and specificity.  There are no identifiable plans, no associated intent, mild dysphoria and related symptoms, good self-control (both objective and subjective assessment), few other risk factors, and identifiable protective factors, including available and accessible social support.  Principal Problem: MDD (major depressive disorder) [facility]) Discharge Diagnoses:  Patient Active Problem List   Diagnosis Date Noted  . MDD (major depressive disorder) (HCC) [F32.9] 04/12/2015  . 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 Severe episode of recurrent major depressive disorder, without psychotic features (HCC) [F33.2]   . GAD (generalized anxiety disorder) [F41.1]     Follow-up Information    Follow up with [facility]  On 04/24/2015.   Why:  Assessment for therapy and medication management services on Monday January 9th between 9am to 3pm. Please arrive early in the morning with a letter from  shelter stating that you are residing there in order to be seen as soon as possible.   Contact information:   [address] [place] [address] [phone] Fax [phone]      Plan Of Care/Follow-up recommendations:  Activity:  as  tolerated  Diet:  Regular Tests:  NA Other:  see below   Is patient on multiple antipsychotic therapies at discharge:  No   Has Patient had three or more failed trials of antipsychotic monotherapy by history:  No  Recommended Plan for Multiple Antipsychotic Therapies: NA Patient is leaving in good spirits Plans to go to [facility]  Follow up as above Patient encouraged to follow up with PCP/ Neurologist for ongoing management of Medical issues, history of seizure disorder   Yakov Bergen 04/20/2015, 11:33 AM

## 2015-12-01 ENCOUNTER — Emergency Department: Admit: 2015-12-02 | Payer: Self-pay

## 2015-12-01 ENCOUNTER — Inpatient Hospital Stay
Admit: 2015-12-01 | Discharge: 2015-12-02 | Disposition: A | Discharge status: Home / Self Care | Payer: Self-pay | Attending: Emergency Medicine

## 2015-12-01 DIAGNOSIS — R1084 Generalized abdominal pain: Secondary | ICD-10-CM

## 2015-12-01 LAB — METABOLIC PANEL, COMPREHENSIVE
A-G Ratio: 0.8 — ABNORMAL LOW (ref 1.2–3.5)
ALT (SGPT): 60 U/L (ref 12–65)
AST (SGOT): 50 U/L — ABNORMAL HIGH (ref 15–37)
Albumin: 3.3 g/dL — ABNORMAL LOW (ref 3.5–5.0)
Alk. phosphatase: 88 U/L (ref 50–136)
Anion gap: 14 mmol/L (ref 7–16)
BUN: 16 MG/DL (ref 6–23)
Bilirubin, total: 0.3 MG/DL (ref 0.2–1.1)
CO2: 24 mmol/L (ref 21–32)
Calcium: 8.7 MG/DL (ref 8.3–10.4)
Chloride: 105 mmol/L (ref 98–107)
Creatinine: 1.28 MG/DL (ref 0.8–1.5)
GFR est AA: >60 mL/min/{1.73_m2} (ref >60)
GFR est non-AA: >60 mL/min/{1.73_m2} (ref >60)
Globulin: 4 g/dL — ABNORMAL HIGH (ref 2.3–3.5)
Glucose: 172 mg/dL — ABNORMAL HIGH (ref 65–100)
Potassium: 3.7 mmol/L (ref 3.5–5.1)
Protein, total: 7.3 g/dL (ref 6.3–8.2)
Sodium: 143 mmol/L (ref 136–145)

## 2015-12-01 LAB — CBC WITH AUTOMATED DIFF
ABS. BASOPHILS: 0 10*3/uL (ref 0.0–0.2)
ABS. EOSINOPHILS: 0.1 10*3/uL (ref 0.0–0.8)
ABS. IMM. GRANS.: 0 10*3/uL (ref 0.0–0.5)
ABS. LYMPHOCYTES: 2.5 10*3/uL (ref 0.5–4.6)
ABS. MONOCYTES: 0.7 10*3/uL (ref 0.1–1.3)
ABS. NEUTROPHILS: 5.2 10*3/uL (ref 1.7–8.2)
BASOPHILS: 1 % (ref 0.0–2.0)
EOSINOPHILS: 2 % (ref 0.5–7.8)
HCT: 45.4 % (ref 41.1–50.3)
HGB: 15.7 g/dL (ref 13.6–17.2)
IMMATURE GRANULOCYTES: 0.3 % (ref 0.0–5.0)
LYMPHOCYTES: 30 % (ref 13–44)
MCH: 31.4 PG (ref 26.1–32.9)
MCHC: 34.6 g/dL (ref 31.4–35.0)
MCV: 90.8 FL (ref 79.6–97.8)
MONOCYTES: 8 % (ref 4.0–12.0)
MPV: 9.3 FL — ABNORMAL LOW (ref 10.8–14.1)
NEUTROPHILS: 59 % (ref 43–78)
PLATELET: 304 10*3/uL (ref 150–450)
RBC: 5 M/uL (ref 4.23–5.67)
RDW: 13.5 % (ref 11.9–14.6)
WBC: 8.6 10*3/uL (ref 4.3–11.1)

## 2015-12-01 LAB — URINE MICROSCOPIC
Bacteria: 0 /hpf
Casts: 0 /lpf
Epithelial cells: 0 /hpf
WBC: 0 /hpf

## 2015-12-01 LAB — LIPASE: Lipase: 188 U/L (ref 73–393)

## 2015-12-01 NOTE — ED Notes (Signed)
Report to Patrick, RN.

## 2015-12-01 NOTE — ED Notes (Signed)
Full report from [employer]. Care assumed by this RN at this time.

## 2015-12-01 NOTE — ED Notes (Signed)
Dr. Wetenhall at bedside.

## 2015-12-01 NOTE — ED Notes (Signed)
Patient states he's 'still having suicidal ideations'.  Dr. Verlon AuWetenhall made aware.

## 2015-12-01 NOTE — ED Notes (Signed)
Patient complaining that no one has seen him yet, explained that a doctor has been assigned to him and will see him shortly.  Explained to patient that all of his blood work is being processed right now as well.

## 2015-12-01 NOTE — ED Provider Notes (Signed)
HPI Comments: Patient here with upper abdominal pain that he states is now been going on for 4 days.  With this he is at nausea and diarrhea.   Denies any abdominal distention.  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  He arrived in [place] yesterday and was going to go to a rehabilitation service called [facility].  He states that once they were aware that he had seizure disorder he was not allowed.  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 He denies any other present complaints.past edematous had issues with his pancreas.  Denies any alcohol abuse.    Some point after my initial contact he asked nurse to come over and states that he had forgotten to tell me that he has suicidal thoughts.  Last psychiatric admission was about 3 weeks ago and was either in [facility] or [place].  He is in the past tried to kill himself using a pressure medicine,  Cutting his wrists, and also trying to hang himself.    Patient is a 45 y.o. male presenting with abdominal pain. The history is provided by the patient.   Abdominal Pain    Associated symptoms include diarrhea. Pertinent negatives include no vomiting.        Past Medical History:   Diagnosis Date   ??? Anxiety    ??? Bipolar 1 disorder ([place])    ??? Depression    ??? Diabetes ([place])    ??? GERD (gastroesophageal reflux disease)    ??? Hypertension    ??? Panic attack    ??? Seizures ([place])    ??? TBI (traumatic brain injury) ([place])     when 15 months       Past Surgical History:   Procedure Laterality Date   ??? HX CHOLECYSTECTOMY     ??? HX NEPHRECTOMY Left          History reviewed. No pertinent family history.    Social History     Social History   ??? Marital status: SINGLE     Spouse name: N/A   ??? Number of children: N/A   ??? Years of education: N/A     Occupational History   ??? Not on file.     Social History Main Topics   ??? Smoking status: Former Smoker   ??? Smokeless tobacco: Not on file    ??? Alcohol use No   ??? Drug use: No   ??? Sexual activity: Not on file     Other Topics Concern   ??? Not on file     Social History Narrative   ??? No narrative on file         ALLERGIES: Ceftin [cefuroxime axetil]; Pcn [penicillins]; and Reglan [metoclopramide hcl]    Review of Systems   Gastrointestinal: Positive for abdominal pain and diarrhea. Negative for vomiting.   Musculoskeletal: Negative.    Psychiatric/Behavioral: Negative.    All other systems reviewed and are negative.      Vitals:    12/01/15 1912   BP: 125/77   Pulse: (!) 106   Resp: 18   Temp: 98.5 ??F (36.9 ??C)   SpO2: 94%   Weight: 109.8 kg (242 lb)   Height: 5' 11"  (1.803 m)            Physical Exam   Constitutional: He appears well-developed and well-nourished. No distress.   NO ACUTE DISTRESS.  PATIENT ACTUALLY MAKES GOOD  EYE CONTACT.   NO ACUTE EXTREMIS   HENT:   Head: Atraumatic.   Eyes: No scleral icterus.   Neck: Neck supple.   Cardiovascular: Normal rate.    Pulmonary/Chest: Effort normal. No respiratory distress. He has no wheezes.   Abdominal: Soft. There is no tenderness. There is no rebound and no guarding.   BENIGN ABDOMEN   Musculoskeletal: Normal range of motion. He exhibits no edema or tenderness.   Neurological: He is alert.   Skin: Skin is warm and dry.   Psychiatric: His behavior is normal. Thought content normal.   Patient later in stay states that he has self harming thought this  Is somewhat on certain whether it is due to discussion of plan at discharge and patient that is homeless first is true self harming intent.  I've talked with psychiatry after their prolonged interview.  They feels the patient may be discharged as long as he has a shelter to go to we will help arrange this.  The patient in stay in our department or early a.m. Hours also give him social worker to contact   Nursing note and vitals reviewed.       MDM  Number of Diagnoses or Management Options  Abdominal pain, generalized:   Psychiatric illness:    Diagnosis management comments: Patient initially presented with abdominal pain.  He later added that he had some suicidal thoughts.  Much of this, sent in regard to his coming to [facility] than not being able to  [facility] which is evidently in about a drug rehabilitation program this due to his history of prior seizure disorder.  I discussed at length with telepsychiatryand they feels the patient is stable for discharge and will be helpful if we can get him into a shelter and help them have follow-up with mental health both of these will be done.       Amount and/or Complexity of Data Reviewed  Clinical lab tests: ordered and reviewed  Discuss the patient with other providers: yes    Risk of Complications, Morbidity, and/or Mortality  Presenting problems: high  Diagnostic procedures: low  Management options: moderate    Patient Progress  Patient progress: stable    ED Course       Procedures    Recent Results (from the past 12 hour(s))   CBC WITH AUTOMATED DIFF    Collection Time: 12/01/15  7:21 PM   Result Value Ref Range    WBC 8.6 4.3 - 11.1 K/uL    RBC 5.00 4.23 - 5.67 M/uL    HGB 15.7 13.6 - 17.2 g/dL    HCT 45.4 41.1 - 50.3 %    MCV 90.8 79.6 - 97.8 FL    MCH 31.4 26.1 - 32.9 PG    MCHC 34.6 31.4 - 35.0 g/dL    RDW 13.5 11.9 - 14.6 %    PLATELET 304 150 - 450 K/uL    MPV 9.3 (L) 10.8 - 14.1 FL    DF AUTOMATED      NEUTROPHILS 59 43 - 78 %    LYMPHOCYTES 30 13 - 44 %    MONOCYTES 8 4.0 - 12.0 %    EOSINOPHILS 2 0.5 - 7.8 %    BASOPHILS 1 0.0 - 2.0 %    IMMATURE GRANULOCYTES 0.3 0.0 - 5.0 %    ABS. NEUTROPHILS 5.2 1.7 - 8.2 K/UL    ABS. LYMPHOCYTES 2.5 0.5 - 4.6 K/UL    ABS.  MONOCYTES 0.7 0.1 - 1.3 K/UL    ABS. EOSINOPHILS 0.1 0.0 - 0.8 K/UL    ABS. BASOPHILS 0.0 0.0 - 0.2 K/UL    ABS. IMM. GRANS. 0.0 0.0 - 0.5 K/UL   METABOLIC PANEL, COMPREHENSIVE    Collection Time: 12/01/15  7:21 PM   Result Value Ref Range    Sodium 143 136 - 145 mmol/L    Potassium 3.7 3.5 - 5.1 mmol/L     Chloride 105 98 - 107 mmol/L    CO2 24 21 - 32 mmol/L    Anion gap 14 7 - 16 mmol/L    Glucose 172 (H) 65 - 100 mg/dL    BUN 16 6 - 23 MG/DL    Creatinine 1.28 0.8 - 1.5 MG/DL    GFR est AA >60 >60 ml/min/1.85m    GFR est non-AA >60 >60 ml/min/1.745m   Calcium 8.7 8.3 - 10.4 MG/DL    Bilirubin, total 0.3 0.2 - 1.1 MG/DL    ALT (SGPT) 60 12 - 65 U/L    AST (SGOT) 50 (H) 15 - 37 U/L    Alk. phosphatase 88 50 - 136 U/L    Protein, total 7.3 6.3 - 8.2 g/dL    Albumin 3.3 (L) 3.5 - 5.0 g/dL    Globulin 4.0 (H) 2.3 - 3.5 g/dL    A-G Ratio 0.8 (L) 1.2 - 3.5     LIPASE    Collection Time: 12/01/15  7:21 PM   Result Value Ref Range    Lipase 188 73 - 393 U/L   URINE MICROSCOPIC    Collection Time: 12/01/15  7:21 PM   Result Value Ref Range    WBC 0 0 /hpf    RBC 0-3 0 /hpf    Epithelial cells 0 0 /hpf    Bacteria 0 0 /hpf    Casts 0 0 /lpf   DRUG SCREEN, URINE    Collection Time: 12/01/15  7:21 PM   Result Value Ref Range    PCP(PHENCYCLIDINE) NEGATIVE       BENZODIAZEPINE NEGATIVE       COCAINE NEGATIVE       AMPHETAMINES NEGATIVE       METHADONE NEGATIVE       THC (TH-CANNABINOL) NEGATIVE       OPIATES NEGATIVE       BARBITURATES NEGATIVE      ETHYL ALCOHOL    Collection Time: 12/01/15 10:14 PM   Result Value Ref Range    ALCOHOL(ETHYL),SERUM <3 MG/DL   SALICYLATE    Collection Time: 12/01/15 10:14 PM   Result Value Ref Range    SALICYLATE <1<5.0L) 2.8 - 20.0 MG/DL   ACETAMINOPHEN    Collection Time: 12/01/15 10:14 PM   Result Value Ref Range    Acetaminophen level <2 (L) 10.0 - 30.0 ug/mL

## 2015-12-01 NOTE — ED Triage Notes (Signed)
Patient arrives via EMS for abdominal pain. States started hurting a couple days ago. States nausea and diarrhea.

## 2015-12-02 LAB — ETHYL ALCOHOL: ALCOHOL(ETHYL),SERUM: <3 MG/DL

## 2015-12-02 LAB — SALICYLATE: Salicylate level: <1.7 MG/DL — ABNORMAL LOW (ref 2.8–20.0)

## 2015-12-02 LAB — DRUG SCREEN, URINE
AMPHETAMINES: NEGATIVE
BARBITURATES: NEGATIVE
BENZODIAZEPINES: NEGATIVE
COCAINE: NEGATIVE
METHADONE: NEGATIVE
OPIATES: NEGATIVE
PCP(PHENCYCLIDINE): NEGATIVE
THC (TH-CANNABINOL): NEGATIVE

## 2015-12-02 LAB — ACETAMINOPHEN: Acetaminophen level: <2 ug/mL — ABNORMAL LOW (ref 10.0–30.0)

## 2015-12-02 MED ORDER — HYDROXYZINE PAMOATE 50 MG CAP
50 mg | ORAL_CAPSULE | Freq: Three times a day (TID) | ORAL | 1 refills | Status: AC | PRN
Start: 2015-12-02 — End: 2015-12-16

## 2015-12-02 NOTE — ED Notes (Signed)
Full report to RN Barton. Care assumed by that RN at this time.     At time of turnover, patient restful in bed with eyes open, even non-labored respirations.

## 2015-12-02 NOTE — ED Notes (Signed)
I have reviewed medications, follow up provider options, and discharge instructions with the patient. The patient verbalized understanding. Copy of discharge information given to patient upon discharge. Prescription(s) given to patient. Patient discharged ambulatory in no distress.

## 2015-12-02 NOTE — ED Notes (Signed)
Patient restful in bed with eyes closed, even non-labored respirations.

## 2015-12-02 NOTE — ED Notes (Signed)
Patient resting, no distress noted.

## 2015-12-02 NOTE — ED Notes (Signed)
Patient talking with psychiatrist

## 2015-12-02 NOTE — ED Notes (Signed)
Telepysch consult being phoned in- machine to bedside.

## 2015-12-15 ENCOUNTER — Inpatient Hospital Stay
Admit: 2015-12-15 | Discharge: 2015-12-15 | Disposition: A | Discharge status: Home / Self Care | Payer: Self-pay | Attending: Emergency Medicine

## 2015-12-15 DIAGNOSIS — R109 Unspecified abdominal pain: Secondary | ICD-10-CM

## 2015-12-15 LAB — CBC WITH AUTOMATED DIFF
ABS. BASOPHILS: 0 10*3/uL (ref 0.0–0.2)
ABS. EOSINOPHILS: 0.1 10*3/uL (ref 0.0–0.8)
ABS. IMM. GRANS.: 0 10*3/uL (ref 0.0–0.5)
ABS. LYMPHOCYTES: 2.2 10*3/uL (ref 0.5–4.6)
ABS. MONOCYTES: 0.6 10*3/uL (ref 0.1–1.3)
ABS. NEUTROPHILS: 5.1 10*3/uL (ref 1.7–8.2)
BASOPHILS: 0 % (ref 0.0–2.0)
EOSINOPHILS: 2 % (ref 0.5–7.8)
HCT: 45.4 % (ref 41.1–50.3)
HGB: 15.9 g/dL (ref 13.6–17.2)
IMMATURE GRANULOCYTES: 0.4 % (ref 0.0–5.0)
LYMPHOCYTES: 27 % (ref 13–44)
MCH: 31.4 PG (ref 26.1–32.9)
MCHC: 35 g/dL (ref 31.4–35.0)
MCV: 89.5 FL (ref 79.6–97.8)
MONOCYTES: 7 % (ref 4.0–12.0)
MPV: 9.1 FL — ABNORMAL LOW (ref 10.8–14.1)
NEUTROPHILS: 64 % (ref 43–78)
PLATELET: 271 10*3/uL (ref 150–450)
RBC: 5.07 M/uL (ref 4.23–5.67)
RDW: 13.6 % (ref 11.9–14.6)
WBC: 8.1 10*3/uL (ref 4.3–11.1)

## 2015-12-15 LAB — METABOLIC PANEL, COMPREHENSIVE
A-G Ratio: 0.9 — ABNORMAL LOW (ref 1.2–3.5)
ALT (SGPT): 47 U/L (ref 12–65)
AST (SGOT): 27 U/L (ref 15–37)
Albumin: 3.4 g/dL — ABNORMAL LOW (ref 3.5–5.0)
Alk. phosphatase: 91 U/L (ref 50–136)
Anion gap: 10 mmol/L (ref 7–16)
BUN: 15 MG/DL (ref 6–23)
Bilirubin, total: 0.2 MG/DL (ref 0.2–1.1)
CO2: 24 mmol/L (ref 21–32)
Calcium: 8.4 MG/DL (ref 8.3–10.4)
Chloride: 107 mmol/L (ref 98–107)
Creatinine: 1.17 MG/DL (ref 0.8–1.5)
GFR est AA: >60 mL/min/{1.73_m2} (ref >60)
GFR est non-AA: >60 mL/min/{1.73_m2} (ref >60)
Globulin: 3.6 g/dL — ABNORMAL HIGH (ref 2.3–3.5)
Glucose: 156 mg/dL — ABNORMAL HIGH (ref 65–100)
Potassium: 3.8 mmol/L (ref 3.5–5.1)
Protein, total: 7 g/dL (ref 6.3–8.2)
Sodium: 141 mmol/L (ref 136–145)

## 2015-12-15 LAB — PHENYTOIN: Phenytoin: 6.9 ug/mL — ABNORMAL LOW (ref 10–20)

## 2015-12-15 LAB — LIPASE: Lipase: 172 U/L (ref 73–393)

## 2015-12-15 MED ORDER — IBUPROFEN 800 MG TAB
800 mg | ORAL | Status: AC
Start: 2015-12-15 — End: 2015-12-15
  Administered 2015-12-15: 17:00:00 via ORAL

## 2015-12-15 MED ORDER — ACETAMINOPHEN 500 MG TAB
500 mg | Freq: Once | ORAL | Status: DC
Start: 2015-12-15 — End: 2015-12-15

## 2015-12-15 MED FILL — MAPAP EXTRA STRENGTH 500 MG TABLET: 500 mg | ORAL | Qty: 2

## 2015-12-15 MED FILL — IBUPROFEN 800 MG TAB: 800 mg | ORAL | Qty: 1

## 2015-12-15 NOTE — ED Triage Notes (Addendum)
Pt arrive via EMS, homeless, c/o dizziness and tremors with right side flank pain 8/10 pain scale when he woke up this morning at 10 am. Pt has left kidney removed. BGL 151, BP 180/100. HR 120. Pt has not taken BP meds today.

## 2015-12-15 NOTE — Progress Notes (Signed)
[facility] contacted to transport patient to [facility]  per patient request.

## 2015-12-15 NOTE — ED Notes (Signed)
I have reviewed discharge instructions with the patient.  The patient verbalized understanding.

## 2015-12-15 NOTE — ED Provider Notes (Signed)
HPI Comments: Patient is a 45 year old male who is new to this community for the past month.  He reports that he is homeless and was recently at [facility] after taken an overdose.  He was released from there Hospital this morning.  He states her social worker set him up with a mission to stay.  He went there to check in but decided not to stay at that shelter.  He then went to a fast food restaurant to get something to eat.  After eating he states he laid down on an abandoned ports to sleep.  He awoke with some right flank and right-sided abdominal pain.  He states he also felt dizzy or weak.  He came here to be evaluated for that.  He states he wants to get [place] to stay with some family and friends.  He currently denies any suicidal or homicidal thoughts.  He denies any overdose or ingestions.    Patient is a 45 y.o. male presenting with flank pain. The history is provided by the patient.   Flank Pain    This is a new problem. The current episode started 1 to 2 hours ago. The problem has not changed since onset.The problem occurs constantly. Patient reports not work related injury.The pain is associated with no known injury. The pain is present in the right side. The pain does not radiate. Associated symptoms include abdominal pain. Pertinent negatives include no chest pain, no fever, no headaches, no bowel incontinence, no bladder incontinence and no dysuria. He has tried nothing for the symptoms.        Past Medical History:   Diagnosis Date   ??? Anxiety    ??? Bipolar 1 disorder ([facility])    ??? Depression    ??? Diabetes ([facility])    ??? GERD (gastroesophageal reflux disease)    ??? Hypertension    ??? Panic attack    ??? Seizures ([facility])    ??? TBI (traumatic brain injury) ([facility])     when 15 months       Past Surgical History:   Procedure Laterality Date   ??? HX CHOLECYSTECTOMY     ??? HX NEPHRECTOMY Left          History reviewed. No pertinent family history.    Social History     Social History    ??? Marital status: SINGLE     Spouse name: N/A   ??? Number of children: N/A   ??? Years of education: N/A     Occupational History   ??? Not on file.     Social History Main Topics   ??? Smoking status: Former Smoker   ??? Smokeless tobacco: Not on file   ??? Alcohol use No   ??? Drug use: No   ??? Sexual activity: Not on file     Other Topics Concern   ??? Not on file     Social History Narrative         ALLERGIES: Ceftin [cefuroxime axetil]; Pcn [penicillins]; and Reglan [metoclopramide hcl]    Review of Systems   Constitutional: Negative.  Negative for fever.   HENT: Negative.    Eyes: Negative.    Respiratory: Negative.    Cardiovascular: Negative for chest pain.   Gastrointestinal: Positive for abdominal pain. Negative for bowel incontinence, nausea and vomiting.   Endocrine: Negative.    Genitourinary: Positive for flank pain. Negative for bladder incontinence and dysuria.   Skin: Negative.    Neurological: Negative for seizures,  syncope and headaches.       Vitals:    12/15/15 1122   BP: 140/90   Pulse: (!) 114   Resp: 20   Temp: 98.6 ??F (37 ??C)   SpO2: 95%   Weight: 99.8 kg (220 lb)   Height: 5\' 11"  (1.803 m)            Physical Exam   Constitutional: He is oriented to person, place, and time. He appears well-developed and well-nourished.   HENT:   Head: Normocephalic and atraumatic.   Neck: Normal range of motion. Neck supple.   Cardiovascular: Normal rate and regular rhythm.    Pulmonary/Chest: Effort normal and breath sounds normal.   Abdominal: Soft. There is no tenderness. There is no rebound and no guarding.   Lymphadenopathy:     He has no cervical adenopathy.   Neurological: He is alert and oriented to person, place, and time. He has normal strength and normal reflexes. No cranial nerve deficit or sensory deficit. GCS eye subscore is 4. GCS verbal subscore is 5. GCS motor subscore is 6.   Skin: Skin is warm and dry.   Psychiatric:   Bizarre affect but no hallucinations and not homicidal or suicidal    Nursing note and vitals reviewed.       MDM  Number of Diagnoses or Management Options  Diagnosis management comments: Differential diagnosis includes gastritis, pancreatitis, homelessness, drug abuse       Amount and/or Complexity of Data Reviewed  Clinical lab tests: ordered and reviewed  Review and summarize past medical records: yes  Independent visualization of images, tracings, or specimens: yes    Risk of Complications, Morbidity, and/or Mortality  Presenting problems: low  Diagnostic procedures: low  Management options: low    Patient Progress  Patient progress: improved    ED Course   12:59 PM  He is now complaining of left knee pain.  I went back and examined his knee there is no obvious trauma there is no abrasion is no bruising it is nontender and has full range of motion.  A dose of Tylenol has been ordered.    1:27 PM  All labs unremarkable  Except for a slightly low Dilantin level but he did not take his prescribed dose yet this morning.  Repeat abdominal exam is still benign and nonsurgical.  I feel that the multitude of complaints is all related to his homelessness.  He states he [place] shelter [place].    Voice dictation software was used during the making of this note.  This software is not perfect and grammatical and other typographical errors may be present.  This note has been proofread, but may still contain errors.  Maryjean KaAnthony W Jaylene Arrowood, MD; 12/15/2015 @1 :27 PM   ===================================================================        Procedures

## 2015-12-16 DIAGNOSIS — F410 Panic disorder [episodic paroxysmal anxiety] without agoraphobia: Secondary | ICD-10-CM

## 2015-12-16 NOTE — ED Triage Notes (Signed)
Approached paramedics for help with anxiety.  States he has been asking other for money to go to [place] for assistance with homelessness.

## 2015-12-17 ENCOUNTER — Inpatient Hospital Stay
Admit: 2015-12-17 | Discharge: 2015-12-17 | Disposition: A | Discharge status: Home / Self Care | Payer: Self-pay | Attending: Emergency Medicine

## 2015-12-17 MED ORDER — LORAZEPAM 1 MG TAB
1 mg | ORAL | Status: AC
Start: 2015-12-17 — End: 2015-12-16
  Administered 2015-12-17: 04:00:00 via ORAL

## 2015-12-17 MED FILL — LORAZEPAM 1 MG TAB: 1 mg | ORAL | Qty: 1

## 2015-12-17 NOTE — ED Provider Notes (Signed)
HPI Comments: Patient with history of anxiety. Today states he was on a bench at the bus station and a lady gave him $ 15. [place] Bus attendant informed him this would cost $190. He states he had a panic attack. He has not used drugs in the past month while being in [place]. He [place] lists of services for the [place] with him. Is having trouble getting help he says due to his seizure disorder.   He states his grandmother in [place] called him and told him he had gotten disability but [place] [facility] [place] [facility] [facility]. States his family doesn't want him coming around.     Patient is a 45 y.o. male presenting with anxiety. The history is provided by the patient and medical records.   Anxiety    This is a recurrent problem. The current episode started 1 to 2 hours ago. The problem has not changed since onset.The problem occurs every several days. The pain is associated with stress. The patient is experiencing no pain. He has tried nothing for the symptoms.        Past Medical History:   Diagnosis Date   ??? Anxiety    ??? Bipolar 1 disorder (HCC)    ??? Depression    ??? Diabetes ([facility])    ??? GERD (gastroesophageal reflux disease)    ??? Hypertension    ??? Panic attack    ??? Seizures ([facility])    ??? TBI (traumatic brain injury) ([facility])     when 15 months       Past Surgical History:   Procedure Laterality Date   ??? HX CHOLECYSTECTOMY     ??? HX NEPHRECTOMY Left          History reviewed. No pertinent family history.    Social History     Social History   ??? Marital status: SINGLE     Spouse name: N/A   ??? Number of children: N/A   ??? Years of education: N/A     Occupational History   ??? Not on file.     Social History Main Topics   ??? Smoking status: Former Smoker   ??? Smokeless tobacco: Not on file   ??? Alcohol use No   ??? Drug use: No   ??? Sexual activity: Not on file     Other Topics Concern   ??? Not on file     Social History Narrative         ALLERGIES: Ceftin [cefuroxime axetil]; Pcn [penicillins]; and Reglan  [metoclopramide hcl]    Review of Systems   Constitutional: Negative.    Respiratory: Negative.    Cardiovascular: Negative.    Gastrointestinal: Negative.    Genitourinary: Negative.    Musculoskeletal: Negative.    Neurological: Positive for seizures.   Psychiatric/Behavioral: Negative for confusion, hallucinations, self-injury and suicidal ideas. The patient is nervous/anxious.        Vitals:    12/16/15 2251   BP: (!) 135/93   Pulse: (!) 116   Resp: 20   Temp: 98.5 ??F (36.9 ??C)   SpO2: 95%   Weight: 99.8 kg (220 lb)   Height: 5\' 11"  (1.803 m)            Physical Exam   Constitutional: He is oriented to person, place, and time. He appears well-developed and well-nourished.   HENT:   Head: Normocephalic and atraumatic.   Mouth/Throat: Oropharynx is clear and  moist.   Eyes: Conjunctivae and EOM are normal. Pupils are equal, round, and reactive to light. No scleral icterus.   Neck: Normal range of motion. Neck supple. No JVD present.   Cardiovascular: Normal rate, regular rhythm, normal heart sounds and intact distal pulses.    Pulmonary/Chest: Effort normal and breath sounds normal.   Abdominal: Soft. Bowel sounds are normal.   Musculoskeletal: Normal range of motion. He exhibits no edema or tenderness.   Neurological: He is alert and oriented to person, place, and time.   Skin: Skin is warm and dry.   Psychiatric: He has a normal mood and affect. His behavior is normal.   Nursing note and vitals reviewed.       MDM  ED Course       Procedures    Anxiety  Homeless  Ativan 1 mg po  Encouraged to access the agencies on the lists he has been previously given

## 2015-12-17 NOTE — ED Notes (Signed)
I have reviewed discharge instructions with the patient.  The patient verbalized understanding.

## 2015-12-21 ENCOUNTER — Inpatient Hospital Stay
Admit: 2015-12-21 | Discharge: 2015-12-21 | Disposition: A | Discharge status: Home / Self Care | Payer: Self-pay | Attending: Emergency Medicine

## 2015-12-21 DIAGNOSIS — F1910 Other psychoactive substance abuse, uncomplicated: Secondary | ICD-10-CM

## 2015-12-21 DIAGNOSIS — F1410 Cocaine abuse, uncomplicated: Secondary | ICD-10-CM

## 2015-12-21 NOTE — ED Triage Notes (Signed)
Pt. Presents to er via ems with c/o homelessness. States depressed and tried to get into shelter but called too late. Second visit today. Smells strong of body odor

## 2015-12-21 NOTE — ED Notes (Signed)
Dr. Galen Manila at bedside.

## 2015-12-21 NOTE — Progress Notes (Signed)
Visited with patient at request of Dr Galen Manilaimmer to provide follow up resources.  Patient provided with explanation and resources for [facility], [facility] and [facility] [employer].  Notified that I have already called [facility] and they have no beds today and that he needed to call them at 8:30 tomorrow morning when they do their discharges for the day. Patient verbalizes understanding.

## 2015-12-21 NOTE — ED Notes (Signed)
Patient given a sandwich tray, snacks and Ginger Ale.

## 2015-12-21 NOTE — ED Notes (Signed)
I have reviewed medications, follow up provider options, and discharge instructions with the patient. The patient verbalized understanding. Copy of discharge information given to patient upon discharge.  Patient discharged in no distress.

## 2015-12-21 NOTE — ED Notes (Signed)
Patient resting in bed with eyes closed.  Patient with regular, non-labored respirations.

## 2015-12-21 NOTE — ED Provider Notes (Signed)
Patient is a 45 y.o. male presenting with other event. The history is provided by the patient.   Other   This is a chronic problem. The problem occurs constantly. The problem has not changed since onset.Nothing aggravates the symptoms. Nothing relieves the symptoms. He has tried nothing for the symptoms.        Past Medical History:   Diagnosis Date   ??? Anxiety    ??? Bipolar 1 disorder (HCC)    ??? Depression    ??? Diabetes (HCC)    ??? GERD (gastroesophageal reflux disease)    ??? Hypertension    ??? Panic attack    ??? Seizures (HCC)    ??? TBI (traumatic brain injury) (HCC)     when 15 months       Past Surgical History:   Procedure Laterality Date   ??? HX CHOLECYSTECTOMY     ??? HX NEPHRECTOMY Left          History reviewed. No pertinent family history.    Social History     Social History   ??? Marital status: SINGLE     Spouse name: N/A   ??? Number of children: N/A   ??? Years of education: N/A     Occupational History   ??? Not on file.     Social History Main Topics   ??? Smoking status: Former Smoker   ??? Smokeless tobacco: Never Used   ??? Alcohol use No   ??? Drug use: Yes     Special: Methamphetamines, Cocaine   ??? Sexual activity: Not on file     Other Topics Concern   ??? Not on file     Social History Narrative         ALLERGIES: Ceftin [cefuroxime axetil]; Pcn [penicillins]; and Reglan [metoclopramide hcl]    Review of Systems   Constitutional: Negative.    HENT: Negative.    Respiratory: Negative.    Cardiovascular: Negative.    Gastrointestinal: Negative.    Endocrine: Negative.    Genitourinary: Negative.    Musculoskeletal: Negative.    Skin: Negative.    Neurological: Negative.    Hematological: Negative.        Vitals:    12/21/15 1343   BP: 134/88   Pulse: 90   Resp: 18   Temp: 98.7 ??F (37.1 ??C)   SpO2: 95%   Weight: 102.1 kg (225 lb)   Height: 5\' 11"  (1.803 m)            Physical Exam   Constitutional: He is oriented to person, place, and time. He appears well-developed and well-nourished. No distress.   HENT:    Head: Normocephalic and atraumatic.   Cardiovascular: Intact distal pulses.    Pulmonary/Chest: Effort normal. No respiratory distress. He has no wheezes. He has no rales.   Abdominal: Soft. He exhibits no distension. There is no tenderness. There is no rebound.   Neurological: He is alert and oriented to person, place, and time.   Skin: Skin is warm and dry.   Bilateral UE bruising from IV (self inflicted and at hospital). No apparent cellulitis at sites.   Psychiatric: He has a normal mood and affect. His behavior is normal.   Nursing note and vitals reviewed.       MDM  Number of Diagnoses or Management Options  Diagnosis management comments: Pleasant and cooperative.  States [facility] (who evaluated and did labs earlier today) didn't give him options he was interested in.  Wants  to be inpatient for drug rehab.  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  Last use >14hrs ago.      Our CM to discuss options with patient (likely few due to "self pay" status).  [facility] help but inpatient rehab may not be option.       Amount and/or Complexity of Data Reviewed  Clinical lab tests: reviewed      ED Course       Procedures

## 2015-12-21 NOTE — ED Triage Notes (Signed)
Pt in via [facility] c/o "hurting all over". States he has been using crystal meth for 7 days straight and is now hurting and not able to sleep. States back pain, nausea, and diarrhea.

## 2015-12-21 NOTE — Progress Notes (Signed)
Request was made by [facility] staff for a Chaplain to come to [facility]. Chaplain Arline AspCindy was first called (whose shift had ended) and then she notified Chaplain On-Call of the request.  The need was for assistance to be given to a homeless man who had been in the ER and was discharged but still in the hospital. Chaplain listened to his needs, went to ER learned more about patient's needs. A couple changes of clothes were provided to the individual. A meal voucher was given to him which he used in the [facility] cafeteria. The Nursing Supervisor provided an yellow cab ride to the individual to the [facility].    Emerson ElectricDanny Bridges  Chaplain

## 2015-12-21 NOTE — ED Triage Notes (Signed)
Per EMS, patient used crystal meth last night and has been unable to sleep.  Patient was seen at [facility] earlier this am per EMS.  Patient in lobby awaiting triage.

## 2015-12-22 ENCOUNTER — Inpatient Hospital Stay
Admit: 2015-12-22 | Discharge: 2015-12-22 | Disposition: A | Discharge status: Home / Self Care | Payer: Self-pay | Attending: Emergency Medicine

## 2015-12-22 NOTE — ED Provider Notes (Signed)
HPI Comments: 3rd er visit today (once at [facility], once here) wants detox/rehab from cocaine and meth.  No place to go  Homeless/hopeless/helpless, not overtly suicidal,  Seen by nurse case manager earlier this afternoon - [facility] beds in the a.m. And he is to call at 08:30.      Patient is a 44 y.o. male presenting with homelessness. The history is provided by the patient.   Homeless          Past Medical History:   Diagnosis Date   ??? Anxiety    ??? Bipolar 1 disorder (HCC)    ??? Depression    ??? Diabetes (HCC)    ??? GERD (gastroesophageal reflux disease)    ??? Hypertension    ??? Panic attack    ??? Seizures (HCC)    ??? TBI (traumatic brain injury) (HCC)     when 15 months       Past Surgical History:   Procedure Laterality Date   ??? HX CHOLECYSTECTOMY     ??? HX NEPHRECTOMY Left          History reviewed. No pertinent family history.    Social History     Social History   ??? Marital status: SINGLE     Spouse name: N/A   ??? Number of children: N/A   ??? Years of education: N/A     Occupational History   ??? Not on file.     Social History Main Topics   ??? Smoking status: Former Smoker   ??? Smokeless tobacco: Never Used   ??? Alcohol use No   ??? Drug use: Yes     Special: Methamphetamines, Cocaine   ??? Sexual activity: Not on file     Other Topics Concern   ??? Not on file     Social History Narrative         ALLERGIES: Ceftin [cefuroxime axetil]; Pcn [penicillins]; and Reglan [metoclopramide hcl]    Review of Systems   Psychiatric/Behavioral: Positive for dysphoric mood, hallucinations and suicidal ideas. Negative for self-injury.       Vitals:    12/21/15 2343   BP: 146/85   Pulse: 90   Resp: 18   Temp: 97.9 ??F (36.6 ??C)   SpO2: 98%   Weight: 99.8 kg (220 lb)   Height: 5\' 11"  (1.803 m)            Physical Exam   Constitutional: He is oriented to person, place, and time. He appears well-developed and well-nourished. No distress.   HENT:   Head: Normocephalic and atraumatic.    Eyes: Conjunctivae and EOM are normal. Right eye exhibits no discharge. Left eye exhibits no discharge.   Neck: Normal range of motion. Neck supple.   Pulmonary/Chest: Effort normal. No respiratory distress.   Musculoskeletal: Normal range of motion.   Neurological: He is alert and oriented to person, place, and time. He has normal strength. He exhibits normal muscle tone.   cni 2-12 grossly  Nl gait,  Nl speech     Skin: Skin is warm and dry. No rash noted. He is not diaphoretic.   Psychiatric: He has a normal mood and affect. His behavior is normal.   Nursing note and vitals reviewed.       MDM  Number of Diagnoses or Management Options  Polysubstance abuse:   Diagnosis management comments: Medical decision making note:  Homeless, needs help with polysubstance abuse, will sleep-over till 6:45 am   Discharge before  shift change  This concludes the "medical decision making note" part of this emergency department visit note.      ED Course       Procedures

## 2015-12-22 NOTE — Progress Notes (Signed)
Patient sleeping in the lobby waiting for me.  He was given clothes from the clothes closet and a voucher for food last night.  We provided him with a taxi to the [facility].  States he sat in [place] and called the crisis line and they gave him a bunch of numbers to call.  States he called them all and none of them could help him.  States he called [facility] this morning and they told him they couldn't take him because of the meth.  States he had a 'panic attack' at [facility] and the ambulance brought him here.  Slept here last night.  He has misplaced the information I gave him for [facility] and has not contacted either of them.  I have provided him with this information again.  Call to [facility] who verified that they do not treat IV meth abusers and suggested I try [facility] in [place].  Called Jodie Echevaria [phone]) who state that they do meth rehab but patient would have to do a phone screening and then would go on a waiting list.  I have provided patient with the number for [facility] and encouraged him to call and do a phone screen.  Call to [facility] who state they have no beds but there is a chance that patient could get in tonight around 6 or 7pm and he is welcome to come and see if they have beds.  Call to [facility] who state patient needed to come at 8am this morning and put his name on a list.  Notified patient was here then and asked for an exception if there are beds available.  Transferred me to another line - message left.

## 2015-12-22 NOTE — Progress Notes (Signed)
No return call from [facility]. Patient is still in the lobby.  Called [facility] again and spoke with Riki RuskJeremy who states they don't make exceptions and patient with have to 'hunker down' for the night and come and put his name on the list tomorrow morning at 8am.  I have notified patient of this and provided him with some crackers, peanut butter, applesauce and a drink.  He has a meal sitting next to him which he states someone in the lobby purchased for him.  Patient is asking for transportation to [facility].  I have informed him that I will call a cab for him again but that he has to stay there and go and put his name on the list in the morning this time.  Notified patient that if he returns without doing that we will not be able to provide any further transportation.

## 2015-12-22 NOTE — ED Notes (Signed)
I have reviewed discharge instructions with the patient.  The patient verbalized understanding.

## 2015-12-27 ENCOUNTER — Inpatient Hospital Stay
Admit: 2015-12-27 | Discharge: 2015-12-27 | Disposition: A | Discharge status: Home / Self Care | Payer: Self-pay | Attending: Emergency Medicine

## 2015-12-27 DIAGNOSIS — R109 Unspecified abdominal pain: Secondary | ICD-10-CM

## 2015-12-27 LAB — CBC WITH AUTOMATED DIFF
ABS. BASOPHILS: 0 10*3/uL (ref 0.0–0.2)
ABS. EOSINOPHILS: 0.2 10*3/uL (ref 0.0–0.8)
ABS. IMM. GRANS.: 0 10*3/uL (ref 0.0–0.5)
ABS. LYMPHOCYTES: 3.1 10*3/uL (ref 0.5–4.6)
ABS. MONOCYTES: 0.9 10*3/uL (ref 0.1–1.3)
ABS. NEUTROPHILS: 5 10*3/uL (ref 1.7–8.2)
BASOPHILS: 0 % (ref 0.0–2.0)
EOSINOPHILS: 2 % (ref 0.5–7.8)
HCT: 43.9 % (ref 41.1–50.3)
HGB: 15.4 g/dL (ref 13.6–17.2)
IMMATURE GRANULOCYTES: 0.4 % (ref 0.0–5.0)
LYMPHOCYTES: 34 % (ref 13–44)
MCH: 31.2 PG (ref 26.1–32.9)
MCHC: 35.1 g/dL — ABNORMAL HIGH (ref 31.4–35.0)
MCV: 89 FL (ref 79.6–97.8)
MONOCYTES: 9 % (ref 4.0–12.0)
MPV: 9.2 FL — ABNORMAL LOW (ref 10.8–14.1)
NEUTROPHILS: 55 % (ref 43–78)
PLATELET: 307 10*3/uL (ref 150–450)
RBC: 4.93 M/uL (ref 4.23–5.67)
RDW: 13.2 % (ref 11.9–14.6)
WBC: 9.2 10*3/uL (ref 4.3–11.1)

## 2015-12-27 LAB — METABOLIC PANEL, COMPREHENSIVE
A-G Ratio: 0.8 — ABNORMAL LOW (ref 1.2–3.5)
ALT (SGPT): 48 U/L (ref 12–65)
AST (SGOT): 24 U/L (ref 15–37)
Albumin: 3 g/dL — ABNORMAL LOW (ref 3.5–5.0)
Alk. phosphatase: 101 U/L (ref 50–136)
Anion gap: 11 mmol/L (ref 7–16)
BUN: 11 MG/DL (ref 6–23)
Bilirubin, total: 0.2 MG/DL (ref 0.2–1.1)
CO2: 25 mmol/L (ref 21–32)
Calcium: 9 MG/DL (ref 8.3–10.4)
Chloride: 105 mmol/L (ref 98–107)
Creatinine: 1.09 MG/DL (ref 0.8–1.5)
GFR est AA: >60 mL/min/{1.73_m2} (ref >60)
GFR est non-AA: >60 mL/min/{1.73_m2} (ref >60)
Globulin: 3.9 g/dL — ABNORMAL HIGH (ref 2.3–3.5)
Glucose: 136 mg/dL — ABNORMAL HIGH (ref 65–100)
Potassium: 3.7 mmol/L (ref 3.5–5.1)
Protein, total: 6.9 g/dL (ref 6.3–8.2)
Sodium: 141 mmol/L (ref 136–145)

## 2015-12-27 LAB — LIPASE: Lipase: 225 U/L (ref 73–393)

## 2015-12-27 MED ORDER — ONDANSETRON 8 MG TAB, RAPID DISSOLVE
8 mg | ORAL_TABLET | Freq: Three times a day (TID) | ORAL | 1 refills | Status: AC | PRN
Start: 2015-12-27 — End: ?

## 2015-12-27 MED ORDER — SODIUM CHLORIDE 0.9% BOLUS IV
0.9 % | Freq: Once | INTRAVENOUS | Status: AC
Start: 2015-12-27 — End: 2015-12-27
  Administered 2015-12-27: 08:00:00 via INTRAVENOUS

## 2015-12-27 MED ORDER — FAMOTIDINE (PF) 20 MG/2 ML IV
20 mg/2 mL | INTRAVENOUS | Status: AC
Start: 2015-12-27 — End: 2015-12-27
  Administered 2015-12-27: 08:00:00 via INTRAVENOUS

## 2015-12-27 MED ORDER — ONDANSETRON (PF) 4 MG/2 ML INJECTION
4 mg/2 mL | INTRAMUSCULAR | Status: AC
Start: 2015-12-27 — End: 2015-12-27
  Administered 2015-12-27: 08:00:00 via INTRAVENOUS

## 2015-12-27 MED FILL — FAMOTIDINE (PF) 20 MG/2 ML IV: 20 mg/2 mL | INTRAVENOUS | Qty: 2

## 2015-12-27 MED FILL — ONDANSETRON (PF) 4 MG/2 ML INJECTION: 4 mg/2 mL | INTRAMUSCULAR | Qty: 2

## 2015-12-27 NOTE — ED Provider Notes (Addendum)
HPI Comments: About a month ago this patient came to [place] in hopes of going into a drug rehabilitation program.he was unable to due to his seizure disorder.  Comes in to our department tonight with some nausea and vomiting and diarrhea.  His last episode of vomiting was around 11:30.  Mild diffuse abdominal discomfort.  No abdominal swelling.  states he has not used any of his drugs of abuse (crystal meth)since last Wednesday.  [facility]  States he is compliant with Dilantin 300 mg twice daily but only received this yesterday when he began taking it again.no fevers or chills.  No urinary symptoms.  No cough or sputum.    Patient is a 45 y.o. male presenting with abdominal pain. The history is provided by the patient.   Abdominal Pain    This is a new problem. The problem has been resolved. The pain is associated with an unknown factor. The pain is located in the generalized abdominal region. Associated symptoms include diarrhea, nausea and vomiting. Pertinent negatives include no fever, no melena, no dysuria, no frequency and no hematuria. Nothing worsens the pain. The pain is relieved by nothing. His past medical history does not include ulcerative colitis, irritable bowel syndrome, pancreatitis or diverticulitis.        Past Medical History:   Diagnosis Date   ??? Anxiety    ??? Bipolar 1 disorder ([place])    ??? Depression    ??? Diabetes ([place])    ??? GERD (gastroesophageal reflux disease)    ??? Hypertension    ??? Panic attack    ??? Seizures ([place])    ??? TBI (traumatic brain injury) ([place])     when 15 months       Past Surgical History:   Procedure Laterality Date   ??? HX CHOLECYSTECTOMY     ??? HX NEPHRECTOMY Left          History reviewed. No pertinent family history.    Social History     Social History   ??? Marital status: SINGLE     Spouse name: N/A   ??? Number of children: N/A   ??? Years of education: N/A     Occupational History   ??? Not on file.     Social History Main Topics    ??? Smoking status: Former Smoker   ??? Smokeless tobacco: Never Used   ??? Alcohol use No   ??? Drug use: Yes     Special: Methamphetamines, Cocaine   ??? Sexual activity: Not on file     Other Topics Concern   ??? Not on file     Social History Narrative         ALLERGIES: Ceftin [cefuroxime axetil]; Pcn [penicillins]; and Reglan [metoclopramide hcl]    Review of Systems   Constitutional: Negative for fever.   Gastrointestinal: Positive for abdominal pain, diarrhea, nausea and vomiting. Negative for melena.   Genitourinary: Negative for dysuria, frequency and hematuria.       Vitals:    12/27/15 0039   BP: 133/80   Pulse: 90   Resp: 20   Temp: 98.2 ??F (36.8 ??C)   SpO2: 93%   Weight: 106.1 kg (234 lb)   Height: 5' 11" (1.803 m)            Physical Exam   Constitutional: He appears well-developed and well-nourished. No distress.   Not toxic or septic   HENT:   Head: Atraumatic.   Eyes: Right  eye exhibits no discharge. No scleral icterus.   Neck: Neck supple.   Cardiovascular: Normal rate and intact distal pulses.    Pulmonary/Chest: Effort normal. No respiratory distress. He has no wheezes.   Abdominal: There is no tenderness. There is no rebound and no guarding.   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 He exhibits no edema.   Neurological: He is alert.   Skin: Skin is warm and dry.   Psychiatric: Thought content normal.   Nursing note and vitals reviewed.       MDM  Number of Diagnoses or Management Options  Nausea vomiting and diarrhea:   Diagnosis management comments: History of nausea vomiting and diarrhea.  His last episode of vomiting was around 11:30 PM.  Has not had either vomiting or diarrhea since arrival in our department.  Do not feel as though there is an acute ongoing issue and certainly no surgical findings at present.  Will do diagnostic studies to evaluate hydration status, rule out or evaluate for pancreatitis, and address abnormality should that be  found. Patient is given IV fluids and anti-emetics along with acid suppression.  He continues to be improved in our department will be discharged       Amount and/or Complexity of Data Reviewed  Clinical lab tests: reviewed and ordered  Decide to obtain previous medical records or to obtain history from someone other than the patient: yes    Risk of Complications, Morbidity, and/or Mortality  Presenting problems: moderate  Diagnostic procedures: low  Management options: moderate  General comments: To return with worsening  Will send home prescription for Zofran to take as needed for nausea    Patient Progress  Patient progress: improved    ED Course       Procedures    Recent Results (from the past 12 hour(s))   METABOLIC PANEL, COMPREHENSIVE    Collection Time: 12/27/15 12:43 AM   Result Value Ref Range    Sodium 141 136 - 145 mmol/L    Potassium 3.7 3.5 - 5.1 mmol/L    Chloride 105 98 - 107 mmol/L    CO2 25 21 - 32 mmol/L    Anion gap 11 7 - 16 mmol/L    Glucose 136 (H) 65 - 100 mg/dL    BUN 11 6 - 23 MG/DL    Creatinine 1.09 0.8 - 1.5 MG/DL    GFR est AA >60 >60 ml/min/1.64m    GFR est non-AA >60 >60 ml/min/1.790m   Calcium 9.0 8.3 - 10.4 MG/DL    Bilirubin, total 0.2 0.2 - 1.1 MG/DL    ALT (SGPT) 48 12 - 65 U/L    AST (SGOT) 24 15 - 37 U/L    Alk. phosphatase 101 50 - 136 U/L    Protein, total 6.9 6.3 - 8.2 g/dL    Albumin 3.0 (L) 3.5 - 5.0 g/dL    Globulin 3.9 (H) 2.3 - 3.5 g/dL    A-G Ratio 0.8 (L) 1.2 - 3.5     CBC WITH AUTOMATED DIFF    Collection Time: 12/27/15 12:43 AM   Result Value Ref Range    WBC 9.2 4.3 - 11.1 K/uL    RBC 4.93 4.23 - 5.67 M/uL    HGB 15.4 13.6 - 17.2 g/dL    HCT 43.9 41.1 - 50.3 %    MCV 89.0 79.6 - 97.8 FL    MCH 31.2 26.1 - 32.9 PG  MCHC 35.1 (H) 31.4 - 35.0 g/dL    RDW 13.2 11.9 - 14.6 %    PLATELET 307 150 - 450 K/uL    MPV 9.2 (L) 10.8 - 14.1 FL    DF AUTOMATED      NEUTROPHILS 55 43 - 78 %    LYMPHOCYTES 34 13 - 44 %    MONOCYTES 9 4.0 - 12.0 %    EOSINOPHILS 2 0.5 - 7.8 %     BASOPHILS 0 0.0 - 2.0 %    IMMATURE GRANULOCYTES 0.4 0.0 - 5.0 %    ABS. NEUTROPHILS 5.0 1.7 - 8.2 K/UL    ABS. LYMPHOCYTES 3.1 0.5 - 4.6 K/UL    ABS. MONOCYTES 0.9 0.1 - 1.3 K/UL    ABS. EOSINOPHILS 0.2 0.0 - 0.8 K/UL    ABS. BASOPHILS 0.0 0.0 - 0.2 K/UL    ABS. IMM. GRANS. 0.0 0.0 - 0.5 K/UL   LIPASE    Collection Time: 12/27/15 12:43 AM   Result Value Ref Range    Lipase 225 73 - 393 U/L

## 2015-12-27 NOTE — ED Notes (Signed)
Discharge medications reviewed with patient and appropriate educational materials and side effects teaching were provided. I have reviewed discharge instructions with the patient.  The patient verbalized understanding.

## 2015-12-27 NOTE — ED Triage Notes (Addendum)
Pt arrives via EMS from shelter [facility] [address] for c/o n/v/d. States started today. States abd pain started after first episode of vomiting. Pt received 4 mg zofran in route. States hx of crystal meth use. States stopped last Wednesday.

## 2016-05-21 ENCOUNTER — Encounter: Payer: Self-pay | Admitting: Emergency Medicine

## 2016-05-21 ENCOUNTER — Emergency Department
Admission: EM | Admit: 2016-05-21 | Discharge: 2016-05-21 | Disposition: A | Discharge status: Home / Self Care | Payer: Self-pay | Attending: Emergency Medicine | Admitting: Emergency Medicine

## 2016-05-21 DIAGNOSIS — R109 Unspecified abdominal pain: Secondary | ICD-10-CM

## 2016-05-21 DIAGNOSIS — Z87891 Personal history of nicotine dependence: Secondary | ICD-10-CM | POA: Insufficient documentation

## 2016-05-21 DIAGNOSIS — Z79899 Other long term (current) drug therapy: Secondary | ICD-10-CM | POA: Insufficient documentation

## 2016-05-21 DIAGNOSIS — Z765 Malingerer [conscious simulation]: Secondary | ICD-10-CM | POA: Insufficient documentation

## 2016-05-21 DIAGNOSIS — G40909 Epilepsy, unspecified, not intractable, without status epilepticus: Secondary | ICD-10-CM | POA: Insufficient documentation

## 2016-05-21 DIAGNOSIS — R1010 Upper abdominal pain, unspecified: Secondary | ICD-10-CM | POA: Insufficient documentation

## 2016-05-21 DIAGNOSIS — G8929 Other chronic pain: Secondary | ICD-10-CM | POA: Insufficient documentation

## 2016-05-21 DIAGNOSIS — R569 Unspecified convulsions: Secondary | ICD-10-CM

## 2016-05-21 HISTORY — DX: Anxiety disorder, unspecified: F41.9

## 2016-05-21 HISTORY — DX: Major depressive disorder, single episode, unspecified: F32.9

## 2016-05-21 HISTORY — DX: Bipolar disorder, unspecified: F31.9

## 2016-05-21 HISTORY — DX: Depression, unspecified: F32.A

## 2016-05-21 LAB — COMPREHENSIVE METABOLIC PANEL
ALT: 35 U/L (ref 17–63)
AST: 24 U/L (ref 15–41)
Albumin: 4 g/dL (ref 3.5–5.0)
Alkaline Phosphatase: 70 U/L (ref 38–126)
Anion gap: 9 (ref 5–15)
BUN: 18 mg/dL (ref 6–20)
CHLORIDE: 104 mmol/L (ref 101–111)
CO2: 26 mmol/L (ref 22–32)
CREATININE: 1.3 mg/dL — AB (ref 0.61–1.24)
Calcium: 8.8 mg/dL — ABNORMAL LOW (ref 8.9–10.3)
GFR calc Af Amer: >60 mL/min (ref >60)
Glucose, Bld: 103 mg/dL — ABNORMAL HIGH (ref 65–99)
POTASSIUM: 3.6 mmol/L (ref 3.5–5.1)
SODIUM: 139 mmol/L (ref 135–145)
Total Bilirubin: 0.5 mg/dL (ref 0.3–1.2)
Total Protein: 7.1 g/dL (ref 6.5–8.1)

## 2016-05-21 LAB — CBC
HEMATOCRIT: 43.8 % (ref 40.0–52.0)
Hemoglobin: 14.7 g/dL (ref 13.0–18.0)
MCH: 30 pg (ref 26.0–34.0)
MCHC: 33.5 g/dL (ref 32.0–36.0)
MCV: 89.6 fL (ref 80.0–100.0)
Platelets: 372 10*3/uL (ref 150–440)
RBC: 4.89 MIL/uL (ref 4.40–5.90)
RDW: 13.1 % (ref 11.5–14.5)
WBC: 12.9 10*3/uL — AB (ref 3.8–10.6)

## 2016-05-21 LAB — LIPASE, BLOOD: LIPASE: 27 U/L (ref 11–51)

## 2016-05-21 LAB — URINALYSIS, COMPLETE (UACMP) WITH MICROSCOPIC
BACTERIA UA: NONE SEEN
BILIRUBIN URINE: NEGATIVE
GLUCOSE, UA: NEGATIVE mg/dL
HGB URINE DIPSTICK: NEGATIVE
KETONES UR: NEGATIVE mg/dL
LEUKOCYTES UA: NEGATIVE
Nitrite: NEGATIVE
PROTEIN: NEGATIVE mg/dL
Specific Gravity, Urine: 1.026 (ref 1.005–1.030)
pH: 5 (ref 5.0–8.0)

## 2016-05-21 LAB — TROPONIN I

## 2016-05-21 LAB — PHENYTOIN LEVEL, TOTAL: Phenytoin Lvl: 9.7 ug/mL — ABNORMAL LOW (ref 10.0–20.0)

## 2016-05-21 MED ORDER — PHENYTOIN SODIUM EXTENDED 100 MG PO CAPS
300.0000 mg | ORAL_CAPSULE | ORAL | Status: AC
  Administered 2016-05-21: 300 mg via ORAL
  Filled 2016-05-21: qty 3

## 2016-05-21 NOTE — ED Triage Notes (Signed)
Pt ambulatory to triage with steady gait with multiple medical c/o upper abd pain today, blurry vision and dizziness since yesterday. Pt reports was told by a friend he had a seizure last night. Pt takes Dilantin but states "I forget to take it." Pt states "its been months since I took it."

## 2016-05-21 NOTE — ED Provider Notes (Signed)
[facility] Emergency Department Provider Note  ____________________________________________   First MD Initiated Contact with Patient 05/21/16 0530     (approximate)  I have reviewed the triage vital signs and the nursing notes.   HISTORY  Chief Complaint Seizures    HPI Barry Daugherty is a 46 y.o. male who reports a history of bipolar disorder, brain injury, depression, and seizure disorder on Dilantin who presents for evaluation of possible seizure-like activity yesterday as well as some ongoing abdominal pain that is chronic.  He admits that he was seen in [place] 3 or 4 days ago for an evaluation of the same symptoms.  He states that nothing makes them better or worse.  He states he has not consistent with taking his Dilantin.  He denies fever/chills, chest pain, shortness of breath, nausea, vomiting, diarrhea.  He has no numbness or tingling in any of his extremities.  He does not remember having a seizure but his friend states that he was shaking around like he was having a seizure.  He denies alcohol and drug use.   Past Medical History:  Diagnosis Date  . Anxiety   . Bipolar 1 disorder (HCC)   . Brain injury (HCC)   . Depression   . Seizures [facility])     Patient Active Problem List   Diagnosis Date Noted  . MDD (major depressive disorder) 04/12/2015  . Substance induced mood disorder (HCC) 04/04/2015  . Severe episode of recurrent major depressive disorder, without psychotic features (HCC)   . GAD (generalized anxiety disorder)     Past Surgical History:  Procedure Laterality Date  . CHOLECYSTECTOMY    . NEPHRECTOMY      Prior to Admission medications   Medication Sig Start Date End Date Taking? Authorizing Provider  amLODipine (NORVASC) 10 MG tablet Take 1 tablet (10 mg total) by mouth daily. 04/20/15   Beau Fanny, FNP  atenolol (TENORMIN) 50 MG tablet Take 1 tablet (50 mg total) by mouth daily. 04/20/15   Beau Fanny, FNP    FLUoxetine (PROZAC) 20 MG capsule Take 1 capsule (20 mg total) by mouth daily. 04/20/15   Beau Fanny, FNP  gabapentin (NEURONTIN) 300 MG capsule Take 1 capsule (300 mg total) by mouth 4 (four) times daily -  with meals and at bedtime. 04/20/15   Beau Fanny, FNP  lamoTRIgine (LAMICTAL) 25 MG tablet Take 1 tablet (25 mg total) by mouth 2 (two) times daily. 04/20/15   Beau Fanny, FNP  pantoprazole (PROTONIX) 40 MG tablet Take 1 tablet (40 mg total) by mouth daily. 04/20/15   Beau Fanny, FNP  phenytoin (DILANTIN) 300 MG ER capsule Take 1 capsule (300 mg total) by mouth at bedtime. 04/20/15   Beau Fanny, FNP  traZODone (DESYREL) 100 MG tablet Take 1 tablet (100 mg total) by mouth at bedtime as needed for sleep. 04/20/15   Beau Fanny, FNP    Allergies Ceftin [cefuroxime axetil] and Reglan [metoclopramide]  No family history on file.  Social History Social History  Substance Use Topics  . Smoking status: Former Smoker    Types: Cigarettes  . Smokeless tobacco: Never Used  . Alcohol use No     Comment: [facility]    Review of Systems Constitutional: No fever/chills Eyes: No visual changes. ENT: No sore throat. Cardiovascular: Denies chest pain. Respiratory: Denies shortness of breath. Gastrointestinal: Chronic upper abdominal pain.  No nausea, no vomiting.  No diarrhea.  No  constipation. Genitourinary: Negative for dysuria. Musculoskeletal: Negative for back pain. Skin: Negative for rash. Neurological: Negative for headaches, focal weakness or numbness.  Possible seizure like activity.  10-point ROS otherwise negative.  ____________________________________________   PHYSICAL EXAM:  VITAL SIGNS: ED Triage Vitals  Enc Vitals Group     BP 05/21/16 0029 (!) 135/93     Pulse Rate 05/21/16 0029 98     Resp 05/21/16 0029 20     Temp 05/21/16 0029 97.8 F (36.6 C)     Temp Source 05/21/16 0029 Oral     SpO2 05/21/16 0029 99 %     Weight 05/21/16 0030 230 lb (104.3  kg)     Height 05/21/16 0030 5\' 8"  (1.727 m)     Head Circumference --      Peak Flow --      Pain Score 05/21/16 0030 10     Pain Loc --      Pain Edu? --      Excl. in GC? --     Constitutional: Alert and oriented. Well appearing and in no acute distress. Eyes: Conjunctivae are normal. PERRL. EOMI. Head: Atraumatic. Nose: No congestion/rhinnorhea. Mouth/Throat: Mucous membranes are moist.  Oropharynx non-erythematous. Neck: No stridor.  No meningeal signs.   Cardiovascular: Normal rate, regular rhythm. Good peripheral circulation. Grossly normal heart sounds. Respiratory: Normal respiratory effort.  No retractions. Lungs CTAB. Gastrointestinal: Soft and nontender. No distention.  Musculoskeletal: No lower extremity tenderness nor edema. No gross deformities of extremities. Neurologic:  Normal speech and language. No gross focal neurologic deficits are appreciated.  Skin:  Skin is warm, dry and intact. No rash noted. Psychiatric: Mood and affect are normal. Speech and behavior are normal.  ____________________________________________   LABS (all labs ordered are listed, but only abnormal results are displayed)  Labs Reviewed  COMPREHENSIVE METABOLIC PANEL - Abnormal; Notable for the following:       Result Value   Glucose, Bld 103 (*)    Creatinine, Ser 1.30 (*)    Calcium 8.8 (*)    All other components within normal limits  CBC - Abnormal; Notable for the following:    WBC 12.9 (*)    All other components within normal limits  URINALYSIS, COMPLETE (UACMP) WITH MICROSCOPIC - Abnormal; Notable for the following:    Color, Urine YELLOW (*)    APPearance CLEAR (*)    Squamous Epithelial / LPF 0-5 (*)    Crystals PRESENT (*)    All other components within normal limits  PHENYTOIN LEVEL, TOTAL - Abnormal; Notable for the following:    Phenytoin Lvl 9.7 (*)    All other components within normal limits  LIPASE, BLOOD  TROPONIN I    ____________________________________________  EKG  ED ECG REPORT I, Jasdeep Dejarnett, the attending physician, personally viewed and interpreted this ECG.  Date: 05/21/2016 EKG Time: 00:50 Rate: 86 Rhythm: normal sinus rhythm QRS Axis: normal Intervals: normal ST/T Wave abnormalities: normal Conduction Disturbances: none Narrative Interpretation: unremarkable  ____________________________________________  RADIOLOGY   No results found.  ____________________________________________   PROCEDURES  Procedure(s) performed:   Procedures   Critical Care performed: No ____________________________________________   INITIAL IMPRESSION / ASSESSMENT AND PLAN / ED COURSE  Pertinent labs & imaging results that were available during my care of the patient were reviewed by me and considered in my medical decision making (see chart for details).  The patient has no focal neurological deficits.  He has no tenderness to palpation of his abdomen and he  has normal vital signs.  After they report that he had been in [place] several days ago, I looked in the care everywhere and saw that he has actually been to [facility] in [place] on February 2, then took a bus to [facility] and was seen twice on the same day in [place] for similar symptoms.  He then was seen again in [place] 2 days later (yesterday).  When I ask him about all this he admitted it is true.  He then states that a friend got him a taxi to bring him from [facility] to [place] although I have no evidence to support this claim.  The extensive documentation in the system from his [facility] visits indicate no abnormalities on medical exam similar to tonight.  They also mention the malingering behavior and the fact that he was provided with multiple outpatient resources by case management which he admits that he has not followed up.  They even documented that he ask most recently in [facility] if he can just be  admitted for a night so that he has a place to stay.  I asked him about all of these things and he agreed it is all true.  I explained to him that his emergency medical workup was reassuring today and I will give him an extra dose of Dilantin given that his level was slightly subtherapeutic.  I explained that he needs to follow up on the outpatient resources that were provided to him and that the emergency department cannot provide him with a place to stay.  He states that he understands and he apologized for the trouble.  He is ambulatory without any difficulty or any apparent distress at the time of discharge.      ____________________________________________  FINAL CLINICAL IMPRESSION(S) / ED DIAGNOSES  Final diagnoses:  Seizure-like activity (HCC)  Chronic abdominal pain  Malingering     MEDICATIONS GIVEN DURING THIS VISIT:  Medications  phenytoin (DILANTIN) ER capsule 300 mg (300 mg Oral Given 05/21/16 0545)     NEW OUTPATIENT MEDICATIONS STARTED DURING THIS VISIT:  New Prescriptions   No medications on file    Modified Medications   No medications on file    Discontinued Medications   No medications on file     Note:  This document was prepared using Dragon voice recognition software and may include unintentional dictation errors.    Loleta Rose, MD 05/21/16 [phone]

## 2016-05-21 NOTE — Discharge Instructions (Signed)
You have been seen in the Emergency Department (ED) for abdominal pain.  Your evaluation did not identify a clear cause of your symptoms but was generally reassuring.  Your Dilantin level was nearly normal, so we gave you an additional dose of Dilantin.  We are also concerned about the fact that you have now been to EDs in [place], [place], and now [place], all within the space of a couple of days.  You were provided resources for outpatient follow up in [facility], and we encourage you to take advantage of those resources, including the shelter.  You have had another reassuring medical workup, and there is no indication of an emergent medical condition.    Return to the ED if your abdominal pain worsens or fails to improve, you develop bloody vomiting, bloody diarrhea, you are unable to tolerate fluids due to vomiting, fever greater than 101, or other symptoms that concern you.

## 2016-05-21 NOTE — ED Notes (Signed)
Pt here with c/o abdominal pain, states was seen at [facility] ED for the same on 2/2, no problem found.

## 2016-05-22 ENCOUNTER — Encounter (HOSPITAL_COMMUNITY): Payer: Self-pay | Admitting: *Deleted

## 2016-05-22 ENCOUNTER — Emergency Department (HOSPITAL_COMMUNITY)
Admission: EM | Admit: 2016-05-22 | Discharge: 2016-05-22 | Disposition: A | Discharge status: Home / Self Care | Payer: Self-pay | Attending: Emergency Medicine | Admitting: Emergency Medicine

## 2016-05-22 ENCOUNTER — Emergency Department (HOSPITAL_COMMUNITY): Payer: Self-pay

## 2016-05-22 DIAGNOSIS — Z87891 Personal history of nicotine dependence: Secondary | ICD-10-CM | POA: Insufficient documentation

## 2016-05-22 DIAGNOSIS — R079 Chest pain, unspecified: Secondary | ICD-10-CM | POA: Insufficient documentation

## 2016-05-22 DIAGNOSIS — Z79899 Other long term (current) drug therapy: Secondary | ICD-10-CM | POA: Insufficient documentation

## 2016-05-22 DIAGNOSIS — R109 Unspecified abdominal pain: Secondary | ICD-10-CM | POA: Insufficient documentation

## 2016-05-22 LAB — CBC WITH DIFFERENTIAL/PLATELET
BASOS PCT: 1 %
Basophils Absolute: 0.1 10*3/uL (ref 0.0–0.1)
EOS ABS: 0.1 10*3/uL (ref 0.0–0.7)
Eosinophils Relative: 1 %
HCT: 42.8 % (ref 39.0–52.0)
Hemoglobin: 14.8 g/dL (ref 13.0–17.0)
Lymphocytes Relative: 23 %
Lymphs Abs: 2.5 10*3/uL (ref 0.7–4.0)
MCH: 30.3 pg (ref 26.0–34.0)
MCHC: 34.6 g/dL (ref 30.0–36.0)
MCV: 87.5 fL (ref 78.0–100.0)
MONOS PCT: 7 %
Monocytes Absolute: 0.8 10*3/uL (ref 0.1–1.0)
Neutro Abs: 7.3 10*3/uL (ref 1.7–7.7)
Neutrophils Relative %: 68 %
PLATELETS: 256 10*3/uL (ref 150–400)
RBC: 4.89 MIL/uL (ref 4.22–5.81)
RDW: 12.5 % (ref 11.5–15.5)
WBC: 10.7 10*3/uL — ABNORMAL HIGH (ref 4.0–10.5)

## 2016-05-22 LAB — COMPREHENSIVE METABOLIC PANEL
ALBUMIN: 3.9 g/dL (ref 3.5–5.0)
ALT: 31 U/L (ref 17–63)
ANION GAP: 7 (ref 5–15)
AST: 21 U/L (ref 15–41)
Alkaline Phosphatase: 69 U/L (ref 38–126)
BUN: 16 mg/dL (ref 6–20)
CO2: 24 mmol/L (ref 22–32)
Calcium: 8.7 mg/dL — ABNORMAL LOW (ref 8.9–10.3)
Chloride: 107 mmol/L (ref 101–111)
Creatinine, Ser: 1.09 mg/dL (ref 0.61–1.24)
GFR calc Af Amer: >60 mL/min (ref >60)
GFR calc non Af Amer: >60 mL/min (ref >60)
GLUCOSE: 98 mg/dL (ref 65–99)
POTASSIUM: 3.9 mmol/L (ref 3.5–5.1)
SODIUM: 138 mmol/L (ref 135–145)
TOTAL PROTEIN: 7.1 g/dL (ref 6.5–8.1)
Total Bilirubin: 0.3 mg/dL (ref 0.3–1.2)

## 2016-05-22 LAB — URINALYSIS, ROUTINE W REFLEX MICROSCOPIC
Bilirubin Urine: NEGATIVE
GLUCOSE, UA: NEGATIVE mg/dL
Hgb urine dipstick: NEGATIVE
Ketones, ur: NEGATIVE mg/dL
LEUKOCYTES UA: NEGATIVE
Nitrite: NEGATIVE
PROTEIN: NEGATIVE mg/dL
Specific Gravity, Urine: 1.028 (ref 1.005–1.030)
pH: 5 (ref 5.0–8.0)

## 2016-05-22 MED ORDER — IBUPROFEN 800 MG PO TABS: 800.0000 mg | ORAL_TABLET | Freq: Three times a day (TID) | ORAL | 0 refills | Status: DC

## 2016-05-22 MED ORDER — IBUPROFEN 800 MG PO TABS
800.0000 mg | ORAL_TABLET | Freq: Once | ORAL | Status: AC
  Administered 2016-05-22: 800 mg via ORAL
  Filled 2016-05-22: qty 1

## 2016-05-22 NOTE — ED Triage Notes (Signed)
Per EMS, pt complains of left sided flank pain, n/v/d since yesterday. 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 Pt requested to be on O2, was placed on 2L. Pt's sats were 96% on RA. CBG 119.

## 2016-05-22 NOTE — ED Notes (Signed)
Bed: XL24 Expected date:  Expected time:  Means of arrival:  Comments: EMS 46 yo male left flank pain 8/10-N/V/D-c/o difficulty breathing-O2 2l/[place]

## 2016-05-22 NOTE — ED Provider Notes (Signed)
[facility]-EMERGENCY DEPT Provider Note   CSN: [id number] Arrival date & time: 05/22/16  1854     History   Chief Complaint Chief Complaint  Patient presents with  . Flank Pain    HPI Barry Bearman is a 46 y.o. male.  The history is provided by the patient. No language interpreter was used.  Flank Pain  This is a new problem. The current episode started more than 1 week ago. The problem occurs constantly. The problem has been gradually worsening. Pertinent negatives include no headaches and no shortness of breath. Nothing aggravates the symptoms. Nothing relieves the symptoms. He has tried nothing for the symptoms.  Pt complains of pain on his left side.  Pt reports pain in left side of abdomen.   Past Medical History:  Diagnosis Date  . Anxiety   . Bipolar 1 disorder (HCC)   . Brain injury (HCC)   . Depression   . Seizures [facility])     Patient Active Problem List   Diagnosis Date Noted  . MDD (major depressive disorder) 04/12/2015  . Substance induced mood disorder (HCC) 04/04/2015  . Severe episode of recurrent major depressive disorder, without psychotic features (HCC)   . GAD (generalized anxiety disorder)     Past Surgical History:  Procedure Laterality Date  . CHOLECYSTECTOMY    . NEPHRECTOMY         Home Medications    Prior to Admission medications   Medication Sig Start Date End Date Taking? Authorizing Provider  albuterol (PROVENTIL HFA;VENTOLIN HFA) 108 (90 Base) MCG/ACT inhaler Inhale 1-2 puffs into the lungs every 6 (six) hours as needed for wheezing or shortness of breath.   Yes Historical Provider, MD  amLODipine (NORVASC) 10 MG tablet Take 1 tablet (10 mg total) by mouth daily. 04/20/15  Yes Beau Fanny, FNP  atenolol (TENORMIN) 50 MG tablet Take 1 tablet (50 mg total) by mouth daily. 04/20/15  Yes Beau Fanny, FNP  cetirizine (ZYRTEC) 10 MG tablet Take 10 mg by mouth daily.   Yes Historical Provider, MD  FLUoxetine (PROZAC) 20 MG capsule Take 1  capsule (20 mg total) by mouth daily. 04/20/15  Yes Beau Fanny, FNP  folic acid (FOLVITE) 1 MG tablet Take 1 mg by mouth daily.   Yes Historical Provider, MD  gabapentin (NEURONTIN) 300 MG capsule Take 1 capsule (300 mg total) by mouth 4 (four) times daily -  with meals and at bedtime. 04/20/15  Yes Beau Fanny, FNP  lamoTRIgine (LAMICTAL) 25 MG tablet Take 1 tablet (25 mg total) by mouth 2 (two) times daily. 04/20/15  Yes Beau Fanny, FNP  Multiple Vitamin (MULTIVITAMIN WITH MINERALS) TABS tablet Take 1 tablet by mouth daily.   Yes Historical Provider, MD  pantoprazole (PROTONIX) 40 MG tablet Take 1 tablet (40 mg total) by mouth daily. 04/20/15  Yes Beau Fanny, FNP  phenytoin (DILANTIN) 100 MG ER capsule Take 300 mg by mouth at bedtime.   Yes Historical Provider, MD  predniSONE (DELTASONE) 20 MG tablet Take 20-60 mg by mouth daily with breakfast. Pt is to take as a taper:  3 tablets for two days, 2 tablets for two days, 1 tablet for two days, then STOP. 05/18/16 05/24/16 Yes Historical Provider, MD  thiamine (VITAMIN B-1) 100 MG tablet Take 100 mg by mouth daily.   Yes Historical Provider, MD  traZODone (DESYREL) 100 MG tablet Take 1 tablet (100 mg total) by mouth at bedtime as needed for sleep. 04/20/15  Yes Beau Fanny, FNP    Family History No family history on file.  Social History Social History  Substance Use Topics  . Smoking status: Former Smoker    Types: Cigarettes  . Smokeless tobacco: Never Used  . Alcohol use No     Comment: [facility]     Allergies   Ceftin [cefuroxime axetil] and Reglan [metoclopramide]   Review of Systems Review of Systems  Respiratory: Negative for shortness of breath.   Genitourinary: Positive for flank pain.  Neurological: Negative for headaches.  All other systems reviewed and are negative.    Physical Exam Updated Vital Signs BP 136/92   Pulse 93   Temp 97.7 F (36.5 C)   Resp 18   Ht 5\' 8"  (1.727 m)   Wt 105.7 kg   SpO2 96%    BMI 35.43 kg/m   Physical Exam  Constitutional: He appears well-developed and well-nourished.  HENT:  Head: Normocephalic and atraumatic.  Eyes: Conjunctivae are normal.  Neck: Neck supple.  Cardiovascular: Normal rate and regular rhythm.   No murmur heard. Pulmonary/Chest: Effort normal and breath sounds normal. No respiratory distress.  Tender left lower rib area,    Abdominal: Soft. There is no tenderness.  Musculoskeletal: He exhibits no edema.  Neurological: He is alert.  Skin: Skin is warm and dry.  Psychiatric: He has a normal mood and affect.  Nursing note and vitals reviewed.    ED Treatments / Results  Labs (all labs ordered are listed, but only abnormal results are displayed) Labs Reviewed  CBC WITH DIFFERENTIAL/PLATELET - Abnormal; Notable for the following:       Result Value   WBC 10.7 (*)    All other components within normal limits  URINALYSIS, ROUTINE W REFLEX MICROSCOPIC - Abnormal; Notable for the following:    APPearance HAZY (*)    All other components within normal limits  COMPREHENSIVE METABOLIC PANEL    EKG  EKG Interpretation None       Radiology Dg Chest 2 View  Result Date: 05/22/2016 CLINICAL DATA:  Chest pain EXAM: CHEST  2 VIEW COMPARISON:  Chest radiograph 04/08/2015 FINDINGS: The lungs are well inflated. Cardiomediastinal contours are normal. No pneumothorax or pleural effusion. No focal airspace consolidation or pulmonary edema. IMPRESSION: Clear lungs. Electronically Signed   By: Deatra Robinson M.D.   On: 05/22/2016 21:00    Procedures Procedures (including critical care time)  Medications Ordered in ED Medications  ibuprofen (ADVIL,MOTRIN) tablet 800 mg (not administered)     Initial Impression / Assessment and Plan / ED Course  I have reviewed the triage vital signs and the nursing notes.  Pertinent labs & imaging results that were available during my care of the patient were reviewed by me and considered in my medical  decision making (see chart for details).     Pt had a ct scan done a week ago on his abdomen.  Pt has had left kidney removed. Pain seems more muscular.  Chest xray is normal.  Pt given ibuprofen here.  Pt advised to follow up with his MD  Final Clinical Impressions(s) / ED Diagnoses   Final diagnoses:  Left flank pain    New Prescriptions New Prescriptions   IBUPROFEN (ADVIL,MOTRIN) 800 MG TABLET    Take 1 tablet (800 mg total) by mouth 3 (three) times daily.     Elson Areas, PA-C 05/22/16 2209    Melene Plan, DO 05/22/16 2221

## 2016-05-22 NOTE — Discharge Instructions (Signed)
Return if any problems.

## 2017-03-15 DIAGNOSIS — I639 Cerebral infarction, unspecified: Secondary | ICD-10-CM

## 2017-03-15 HISTORY — DX: Cerebral infarction, unspecified: I63.9

## 2017-04-15 DIAGNOSIS — I219 Acute myocardial infarction, unspecified: Secondary | ICD-10-CM

## 2017-04-15 HISTORY — DX: Acute myocardial infarction, unspecified: I21.9

## 2017-10-15 ENCOUNTER — Inpatient Hospital Stay (HOSPITAL_COMMUNITY)
Admission: AD | Admit: 2017-10-15 | Discharge: 2017-10-28 | DRG: 885 | Disposition: A | Discharge status: Home / Self Care | Payer: Federal, State, Local not specified - Other | Source: Intra-hospital | Attending: Emergency Medicine | Admitting: Emergency Medicine

## 2017-10-15 ENCOUNTER — Encounter (HOSPITAL_COMMUNITY): Payer: Self-pay

## 2017-10-15 ENCOUNTER — Other Ambulatory Visit: Payer: Self-pay

## 2017-10-15 ENCOUNTER — Encounter (HOSPITAL_COMMUNITY): Payer: Self-pay | Admitting: *Deleted

## 2017-10-15 ENCOUNTER — Emergency Department (HOSPITAL_COMMUNITY)
Admission: EM | Admit: 2017-10-15 | Discharge: 2017-10-15 | Disposition: A | Discharge status: Other Acute Inpatient Hospital | Payer: Self-pay | Attending: Emergency Medicine | Admitting: Emergency Medicine

## 2017-10-15 DIAGNOSIS — F411 Generalized anxiety disorder: Secondary | ICD-10-CM | POA: Diagnosis present

## 2017-10-15 DIAGNOSIS — R0789 Other chest pain: Secondary | ICD-10-CM

## 2017-10-15 DIAGNOSIS — Z87891 Personal history of nicotine dependence: Secondary | ICD-10-CM | POA: Insufficient documentation

## 2017-10-15 DIAGNOSIS — G4700 Insomnia, unspecified: Secondary | ICD-10-CM | POA: Diagnosis present

## 2017-10-15 DIAGNOSIS — Z881 Allergy status to other antibiotic agents status: Secondary | ICD-10-CM

## 2017-10-15 DIAGNOSIS — R42 Dizziness and giddiness: Secondary | ICD-10-CM | POA: Insufficient documentation

## 2017-10-15 DIAGNOSIS — K219 Gastro-esophageal reflux disease without esophagitis: Secondary | ICD-10-CM | POA: Diagnosis present

## 2017-10-15 DIAGNOSIS — R1084 Generalized abdominal pain: Secondary | ICD-10-CM | POA: Insufficient documentation

## 2017-10-15 DIAGNOSIS — Z79899 Other long term (current) drug therapy: Secondary | ICD-10-CM | POA: Insufficient documentation

## 2017-10-15 DIAGNOSIS — I10 Essential (primary) hypertension: Secondary | ICD-10-CM | POA: Diagnosis present

## 2017-10-15 DIAGNOSIS — Z9049 Acquired absence of other specified parts of digestive tract: Secondary | ICD-10-CM | POA: Diagnosis not present

## 2017-10-15 DIAGNOSIS — G40909 Epilepsy, unspecified, not intractable, without status epilepticus: Secondary | ICD-10-CM | POA: Diagnosis present

## 2017-10-15 DIAGNOSIS — Z905 Acquired absence of kidney: Secondary | ICD-10-CM

## 2017-10-15 DIAGNOSIS — Z880 Allergy status to penicillin: Secondary | ICD-10-CM

## 2017-10-15 DIAGNOSIS — F329 Major depressive disorder, single episode, unspecified: Secondary | ICD-10-CM | POA: Insufficient documentation

## 2017-10-15 DIAGNOSIS — Z590 Homelessness: Secondary | ICD-10-CM

## 2017-10-15 DIAGNOSIS — F1994 Other psychoactive substance use, unspecified with psychoactive substance-induced mood disorder: Secondary | ICD-10-CM | POA: Diagnosis present

## 2017-10-15 DIAGNOSIS — R45851 Suicidal ideations: Secondary | ICD-10-CM | POA: Diagnosis present

## 2017-10-15 DIAGNOSIS — Z046 Encounter for general psychiatric examination, requested by authority: Secondary | ICD-10-CM | POA: Insufficient documentation

## 2017-10-15 DIAGNOSIS — F332 Major depressive disorder, recurrent severe without psychotic features: Principal | ICD-10-CM | POA: Diagnosis present

## 2017-10-15 DIAGNOSIS — E119 Type 2 diabetes mellitus without complications: Secondary | ICD-10-CM | POA: Diagnosis present

## 2017-10-15 DIAGNOSIS — Z23 Encounter for immunization: Secondary | ICD-10-CM | POA: Diagnosis not present

## 2017-10-15 DIAGNOSIS — Z888 Allergy status to other drugs, medicaments and biological substances status: Secondary | ICD-10-CM | POA: Diagnosis not present

## 2017-10-15 DIAGNOSIS — F1910 Other psychoactive substance abuse, uncomplicated: Secondary | ICD-10-CM | POA: Insufficient documentation

## 2017-10-15 HISTORY — DX: Essential (primary) hypertension: I10

## 2017-10-15 LAB — COMPREHENSIVE METABOLIC PANEL
ALBUMIN: 3.5 g/dL (ref 3.5–5.0)
ALK PHOS: 49 U/L (ref 38–126)
ALT: 26 U/L (ref 0–44)
ANION GAP: 7 (ref 5–15)
AST: 23 U/L (ref 15–41)
BILIRUBIN TOTAL: 0.3 mg/dL (ref 0.3–1.2)
BUN: 14 mg/dL (ref 6–20)
CALCIUM: 8.5 mg/dL — AB (ref 8.9–10.3)
CO2: 25 mmol/L (ref 22–32)
Chloride: 112 mmol/L — ABNORMAL HIGH (ref 98–111)
Creatinine, Ser: 1.17 mg/dL (ref 0.61–1.24)
GFR calc Af Amer: >60 mL/min (ref >60)
GLUCOSE: 106 mg/dL — AB (ref 70–99)
Potassium: 3.7 mmol/L (ref 3.5–5.1)
Sodium: 144 mmol/L (ref 135–145)
TOTAL PROTEIN: 6.3 g/dL — AB (ref 6.5–8.1)

## 2017-10-15 LAB — RAPID URINE DRUG SCREEN, HOSP PERFORMED
Amphetamines: NOT DETECTED
Benzodiazepines: NOT DETECTED
Cocaine: POSITIVE — AB
Opiates: NOT DETECTED
Tetrahydrocannabinol: POSITIVE — AB

## 2017-10-15 LAB — ETHANOL

## 2017-10-15 LAB — CBC WITH DIFFERENTIAL/PLATELET
BASOS ABS: 0 10*3/uL (ref 0.0–0.1)
Basophils Relative: 0 %
Eosinophils Absolute: 0.3 10*3/uL (ref 0.0–0.7)
Eosinophils Relative: 5 %
HEMATOCRIT: 38 % — AB (ref 39.0–52.0)
Hemoglobin: 12.4 g/dL — ABNORMAL LOW (ref 13.0–17.0)
LYMPHS ABS: 2.1 10*3/uL (ref 0.7–4.0)
LYMPHS PCT: 28 %
MCH: 30 pg (ref 26.0–34.0)
MCHC: 32.6 g/dL (ref 30.0–36.0)
MCV: 92 fL (ref 78.0–100.0)
MONO ABS: 0.9 10*3/uL (ref 0.1–1.0)
Monocytes Relative: 13 %
NEUTROS ABS: 4.1 10*3/uL (ref 1.7–7.7)
Neutrophils Relative %: 54 %
Platelets: 273 10*3/uL (ref 150–400)
RBC: 4.13 MIL/uL — AB (ref 4.22–5.81)
RDW: 14.8 % (ref 11.5–15.5)
WBC: 7.5 10*3/uL (ref 4.0–10.5)

## 2017-10-15 LAB — VALPROIC ACID LEVEL: Valproic Acid Lvl: <10 ug/mL — ABNORMAL LOW (ref 50.0–100.0)

## 2017-10-15 MED ORDER — ONDANSETRON HCL 4 MG PO TABS: 4.0000 mg | ORAL_TABLET | Freq: Three times a day (TID) | ORAL | Status: DC | PRN

## 2017-10-15 MED ORDER — ACETAMINOPHEN 325 MG PO TABS: 650.0000 mg | ORAL_TABLET | ORAL | Status: DC | PRN

## 2017-10-15 MED ORDER — ALUM & MAG HYDROXIDE-SIMETH 200-200-20 MG/5ML PO SUSP: 30.0000 mL | Freq: Four times a day (QID) | ORAL | Status: DC | PRN

## 2017-10-15 MED ORDER — PNEUMOCOCCAL VAC POLYVALENT 25 MCG/0.5ML IJ INJ
0.5000 mL | INJECTION | INTRAMUSCULAR | Status: AC
  Administered 2017-10-16: 0.5 mL via INTRAMUSCULAR

## 2017-10-15 MED ORDER — TRAZODONE HCL 100 MG PO TABS: 100.0000 mg | ORAL_TABLET | Freq: Every evening | ORAL | Status: DC | PRN

## 2017-10-15 MED ORDER — ALUM & MAG HYDROXIDE-SIMETH 200-200-20 MG/5ML PO SUSP
30.0000 mL | ORAL | Status: DC | PRN
  Administered 2017-10-17 – 2017-10-26 (×6): 30 mL via ORAL
  Filled 2017-10-15 (×6): qty 30

## 2017-10-15 MED ORDER — NICOTINE 21 MG/24HR TD PT24: 21.0000 mg | MEDICATED_PATCH | Freq: Every day | TRANSDERMAL | Status: DC | PRN

## 2017-10-15 MED ORDER — IBUPROFEN 600 MG PO TABS
600.0000 mg | ORAL_TABLET | Freq: Four times a day (QID) | ORAL | Status: DC | PRN
  Administered 2017-10-16 – 2017-10-26 (×7): 600 mg via ORAL
  Filled 2017-10-15 (×7): qty 1

## 2017-10-15 MED ORDER — HYDROXYZINE HCL 25 MG PO TABS
25.0000 mg | ORAL_TABLET | Freq: Three times a day (TID) | ORAL | Status: DC | PRN
  Administered 2017-10-17 – 2017-10-27 (×7): 25 mg via ORAL
  Filled 2017-10-15 (×4): qty 1
  Filled 2017-10-15: qty 20
  Filled 2017-10-15: qty 1

## 2017-10-15 MED ORDER — MAGNESIUM HYDROXIDE 400 MG/5ML PO SUSP
30.0000 mL | Freq: Every day | ORAL | Status: DC | PRN
  Administered 2017-10-26: 30 mL via ORAL
  Filled 2017-10-15: qty 30

## 2017-10-15 MED ORDER — ACETAMINOPHEN 325 MG PO TABS
650.0000 mg | ORAL_TABLET | Freq: Four times a day (QID) | ORAL | Status: DC | PRN
  Administered 2017-10-15: 650 mg via ORAL
  Filled 2017-10-15: qty 2

## 2017-10-15 NOTE — ED Notes (Signed)
[facility]  notified need for transport to [facility] room 306

## 2017-10-15 NOTE — ED Notes (Signed)
Pt to go to [facility] between 1430-1500

## 2017-10-15 NOTE — ED Notes (Signed)
Sitter at bedside.

## 2017-10-15 NOTE — ED Notes (Signed)
PA at bedside.

## 2017-10-15 NOTE — Tx Team (Signed)
Initial Treatment Plan 10/15/2017 5:01 PM [name] [id number]    PATIENT STRESSORS: Marital or family conflict Medication change or noncompliance Substance abuse   PATIENT STRENGTHS: Ability for insight Average or above average intelligence Capable of independent living General fund of knowledge   PATIENT IDENTIFIED PROBLEMS: Depression Suicidal thoughts Substance Abuse "Get into a rehab center"                     DISCHARGE CRITERIA:  Ability to meet basic life and health needs Improved stabilization in mood, thinking, and/or behavior Reduction of life-threatening or endangering symptoms to within safe limits Verbal commitment to aftercare and medication compliance  PRELIMINARY DISCHARGE PLAN: Attend aftercare/continuing care group  PATIENT/FAMILY INVOLVEMENT: This treatment plan has been presented to and reviewed with the patient, Barry Daugherty, and/or family member, .  The patient and family have been given the opportunity to ask questions and make suggestions.  Chole Driver, [place], [place] 10/15/2017, 5:01 PM

## 2017-10-15 NOTE — ED Notes (Signed)
Bed: BU03 Expected date:  Expected time:  Means of arrival:  Comments: EMS/[facility]/?S.I.

## 2017-10-15 NOTE — ED Notes (Signed)
Peer support at bedside 

## 2017-10-15 NOTE — ED Notes (Signed)
Dietary called 3 times by secretary in regards to missing breakfast tray. Per Dietary: tray should be delivered by 1053. Pt updated. Pt provided with crackers and drink until breakfast tray arrives.

## 2017-10-15 NOTE — ED Notes (Addendum)
Pt changing into scrubs. Pt is calm and compliant  at this time

## 2017-10-15 NOTE — ED Provider Notes (Signed)
[facility]-EMERGENCY DEPT Provider Note   CSN: [id number] Arrival date & time: 10/15/17  0805     History   Chief Complaint Chief Complaint  Patient presents with  . Dizziness  . Suicidal    HPI Barry Daugherty is a 47 y.o. male.  Patient with history of depression, suicidality, seizure disorder on Depakote, homelessness --presents the emergency department with complaint of feeling suicidal.  He states that he relapsed last night after 7 months of sobriety and took cocaine and smokes marijuana.  He denies alcohol.  He reports traveling from [place] to [place] 2 days ago.  He states he was trying [facility] get a [facility].  He reported some chest pain there and he was told to go to [place] (no records in care everywhere regarding this).  He was cleared to go home and states that he decided to come to [place] and not go to [facility] because "I like to travel".  He was up until about 2 AM and upon waking up this morning he felt suicidal.  He states that he was going to take a bunch of pills.  He decided to recheck for help and came to the emergency department by ambulance.  Patient also had an episode of dizziness but did not lose consciousness.  He reports not taking Depakote in about 1 week.  States that he has recently been stressed because he went to [place] to find his family but they would not help him and did not want to talk to him.  He denies any other acute medical issues.     Past Medical History:  Diagnosis Date  . Anxiety   . Bipolar 1 disorder (HCC)   . Brain injury (HCC)   . Depression   . Seizures [facility])     Patient Active Problem List   Diagnosis Date Noted  . MDD (major depressive disorder) 04/12/2015  . Substance induced mood disorder (HCC) 04/04/2015  . Severe episode of recurrent major depressive disorder, without psychotic features (HCC)   . GAD (generalized anxiety disorder)     Past Surgical History:  Procedure Laterality Date  .  CHOLECYSTECTOMY    . NEPHRECTOMY          Home Medications    Prior to Admission medications   Medication Sig Start Date End Date Taking? Authorizing Provider  albuterol (PROVENTIL HFA;VENTOLIN HFA) 108 (90 Base) MCG/ACT inhaler Inhale 1-2 puffs into the lungs every 6 (six) hours as needed for wheezing or shortness of breath.    [provider]  amLODipine (NORVASC) 10 MG tablet Take 1 tablet (10 mg total) by mouth daily. 04/20/15   Withrow, Everardo All, FNP  atenolol (TENORMIN) 50 MG tablet Take 1 tablet (50 mg total) by mouth daily. 04/20/15   Withrow, Everardo All, FNP  cetirizine (ZYRTEC) 10 MG tablet Take 10 mg by mouth daily.    [provider]  FLUoxetine (PROZAC) 20 MG capsule Take 1 capsule (20 mg total) by mouth daily. 04/20/15   Withrow, Everardo All, FNP  folic acid (FOLVITE) 1 MG tablet Take 1 mg by mouth daily.    [provider]  gabapentin (NEURONTIN) 300 MG capsule Take 1 capsule (300 mg total) by mouth 4 (four) times daily -  with meals and at bedtime. 04/20/15   Withrow, Everardo All, FNP  ibuprofen (ADVIL,MOTRIN) 800 MG tablet Take 1 tablet (800 mg total) by mouth 3 (three) times daily. 05/22/16   Elson Areas, PA-C  lamoTRIgine (  LAMICTAL) 25 MG tablet Take 1 tablet (25 mg total) by mouth 2 (two) times daily. 04/20/15   Withrow, Everardo All, FNP  Multiple Vitamin (MULTIVITAMIN WITH MINERALS) TABS tablet Take 1 tablet by mouth daily.    [provider]  pantoprazole (PROTONIX) 40 MG tablet Take 1 tablet (40 mg total) by mouth daily. 04/20/15   Withrow, Everardo All, FNP  phenytoin (DILANTIN) 100 MG ER capsule Take 300 mg by mouth at bedtime.    [provider]  thiamine (VITAMIN B-1) 100 MG tablet Take 100 mg by mouth daily.    [provider]  traZODone (DESYREL) 100 MG tablet Take 1 tablet (100 mg total) by mouth at bedtime as needed for sleep. 04/20/15   Withrow, Everardo All, FNP    Family History History reviewed. No pertinent family history.  Social  History Social History   Tobacco Use  . Smoking status: Former Smoker    Types: Cigarettes  . Smokeless tobacco: Never Used  Substance Use Topics  . Alcohol use: No    Comment: [facility]  . Drug use: Yes    Types: Cocaine, Marijuana    Comment: last used 2 weeks ago      Allergies   Ceftin [cefuroxime axetil]; Reglan [metoclopramide]; and Penicillins   Review of Systems Review of Systems  Constitutional: Negative for fever.  HENT: Negative for rhinorrhea and sore throat.   Eyes: Negative for redness.  Respiratory: Negative for cough.   Cardiovascular: Negative for chest pain.  Gastrointestinal: Negative for abdominal pain, diarrhea, nausea and vomiting.  Genitourinary: Negative for dysuria.  Musculoskeletal: Negative for myalgias.  Skin: Negative for rash.  Neurological: Positive for dizziness. Negative for headaches.  Psychiatric/Behavioral: Positive for suicidal ideas.     Physical Exam Updated Vital Signs BP 140/76 (BP Location: Right Arm)   Pulse 95   Temp 98.1 F (36.7 C) (Oral)   Resp 18   Wt 100.7 kg (222 lb)   SpO2 94%   BMI 33.75 kg/m   Physical Exam  Constitutional: He appears well-developed and well-nourished.  HENT:  Head: Normocephalic and atraumatic.  Mouth/Throat: Oropharynx is clear and moist.  Eyes: Conjunctivae are normal. Right eye exhibits no discharge. Left eye exhibits no discharge.  Neck: Normal range of motion. Neck supple.  Cardiovascular: Normal rate, regular rhythm and normal heart sounds.  Pulmonary/Chest: Effort normal and breath sounds normal. No respiratory distress. He has no rales.  Abdominal: Soft. There is tenderness (generalized, mild). There is no rebound and no guarding.  Neurological: He is alert.  Skin: Skin is warm and dry.  Psychiatric: He has a normal mood and affect.  Nursing note and vitals reviewed.    ED Treatments / Results  Labs (all labs ordered are listed, but only abnormal results are  displayed) Labs Reviewed  COMPREHENSIVE METABOLIC PANEL - Abnormal; Notable for the following components:      Result Value   Chloride 112 (*)    Glucose, Bld 106 (*)    Calcium 8.5 (*)    Total Protein 6.3 (*)    All other components within normal limits  RAPID URINE DRUG SCREEN, HOSP PERFORMED - Abnormal; Notable for the following components:   Cocaine POSITIVE (*)    Tetrahydrocannabinol POSITIVE (*)    Barbiturates   (*)    Value: Result not available. Reagent lot number recalled by manufacturer.   All other components within normal limits  CBC WITH DIFFERENTIAL/PLATELET - Abnormal; Notable for the following components:  RBC 4.13 (*)    Hemoglobin 12.4 (*)    HCT 38.0 (*)    All other components within normal limits  VALPROIC ACID LEVEL - Abnormal; Notable for the following components:   Valproic Acid Lvl <10 (*)    All other components within normal limits  ETHANOL    EKG EKG Interpretation  Date/Time:  Wednesday October 15 2017 09:03:19 EDT Ventricular Rate:  88 PR Interval:    QRS Duration: 78 QT Interval:  352 QTC Calculation: 426 R Axis:   61 Text Interpretation:  Age not entered, assumed to be  47 years old for purpose of ECG interpretation Sinus rhythm since last tracing no significant change Confirmed by Rolan Bucco [phone]) on 10/15/2017 10:24:54 AM   Radiology No results found.  Procedures Procedures (including critical care time)  Medications Ordered in ED Medications  acetaminophen (TYLENOL) tablet 650 mg (has no administration in time range)  ondansetron (ZOFRAN) tablet 4 mg (has no administration in time range)  alum & mag hydroxide-simeth (MAALOX/MYLANTA) 200-200-20 MG/5ML suspension 30 mL (has no administration in time range)  nicotine (NICODERM CQ - dosed in mg/24 hours) patch 21 mg (has no administration in time range)     Initial Impression / Assessment and Plan / ED Course  I have reviewed the triage vital signs and the nursing  notes.  Pertinent labs & imaging results that were available during my care of the patient were reviewed by me and considered in my medical decision making (see chart for details).     Patient seen and examined. Work-up initiated. Medications ordered.   Vital signs reviewed and are as follows: BP 140/76 (BP Location: Right Arm)   Pulse 95   Temp 98.1 F (36.7 C) (Oral)   Resp 18   Wt 100.7 kg (222 lb)   SpO2 94%   BMI 33.75 kg/m   Medical screening labs ordered. Vitals are reassuring, patient does not currently c/o CP. EKG 2/2 recent cocaine use, dizziness, recent CP. Anticipate TTS consult when cleared.   10:00 AM Pt medically cleared. TTS consult requested.   12:10 PM Reviewed TTS note. Inpatient treatment recommended, awaiting placement.    Final Clinical Impressions(s) / ED Diagnoses   Final diagnoses:  Suicidal ideation  Polysubstance abuse (HCC)   Pending placement, pt medically cleared.   Abd pain: minimal, non-focal. No elevated WBC. Do not feel CT indicated given mild sx.   ED Discharge Orders    None       Renne Crigler, PA-C 10/15/17 1211    Rolan Bucco, MD 10/15/17 1256

## 2017-10-15 NOTE — ED Notes (Signed)
Charge RN notified for need for sitter

## 2017-10-15 NOTE — BH Assessment (Signed)
[facility] Assessment Progress Note  Per Juanetta Beets, DO, this pt requires psychiatric hospitalization at this time.  Malva Limes, RN, [facility] has assigned pt to [facility] Rm 306-1; [facility] will be ready to receive pt between 14:30 and 15:00.  Bartholomew Boards, Peer Support Specialist, agrees to go over consents with pt, to fax signed forms to [facility], and to inform pt's nurse of disposition.  Pt is to be transported via [facility].  Please  call report to [phone].  Doylene Canning, [place] [facility] Health Coordinator [phone]

## 2017-10-15 NOTE — Patient Outreach (Signed)
ED Peer Support Specialist Patient Intake (Complete at intake & 30-60 Day Follow-up)  Name: Barry Daugherty  MRN: [id number]  Age: 47 y.o.   Date of Admission: 10/15/2017  Intake: (P) Initial Comments:      Primary Reason Admitted: SI, poly substance use with cocaine and cannabinoids  Lab values: Alcohol/ETOH: (P) Negative Positive UDS? (P) Yes Amphetamines: (P) No Barbiturates: (P) Drug screen not completed Benzodiazepines: (P) No Cocaine: (P) Yes Opiates: (P) No Cannabinoids: (P) Yes  Demographic information: Gender: (P) Male Ethnicity: (P) White Marital Status: (P) Single Insurance Status: (P) Uninsured/Self-pay Ecologist (Work Neurosurgeon, Physicist, medical, etc.: (P) No Lives with: (P) Alone Living situation: (P) Homeless  Reported Patient History: Patient reported health conditions: (P) Bipolar disorder, Depression, Hypertension, Other (comment)(anxiety, seizures) Patient aware of HIV and hepatitis status: (P) Yes (comment)(Negative)  In past year, has patient visited ED for any reason? (P) Yes  Number of ED visits: (P) 1  Reason(s) for visit: (P) suicidal ideations, substance use  In past year, has patient been hospitalized for any reason? (P) Yes  Number of hospitalizations: (P) 1  Reason(s) for hospitalization: (P) suicidal ideations, substance use  In past year, has patient been arrested? (P) No  Number of arrests:    Reason(s) for arrest:    In past year, has patient been incarcerated? (P) No  Number of incarcerations:    Reason(s) for incarceration:    In past year, has patient received medication-assisted treatment? (P) Yes, Other (comment)(Suboxone at [facility])  In past year, patient received the following treatments: (P) Residential treatment (non-hospital)([facility])  In past year, has patient received any harm reduction services? (P) No  Did this include any of the following?     In past year, has patient received care from a mental health provider for diagnosis other than SUD? (P) No  In past year, is this first time patient has overdosed? (P) Yes(has overdosed on blood pressure medication in the past year and was hospitalized for it)  Number of past overdoses:    In past year, is this first time patient has been hospitalized for an overdose? (P) (was not hospitalized today for an overdose)  Number of hospitalizations for overdose(s):    Is patient currently receiving treatment for a mental health diagnosis? (P) No  Patient reports experiencing difficulty participating in SUD treatment: (P) Yes    Most important reason(s) for this difficulty? (P) Lack of stable housing, Other (comment)(no substance use recovery supports)  Has patient received prior services for treatment? (P) Yes([facility])  In past, patient has received services from following agencies: (P) Other (comment)([facility])  Plan of Care:  Suggested follow up at these agencies/treatment centers: (P) (TTS is currently looking at 300 hall placement at [facility]. CPSS will follow up with the patient over there.)  Other information: CPSS met with the patient and provided substance use recovery support. Patient was accepted at [facility] and his bed assignment is 306-01. Patient will go over to [facility] between 2:30-3:00pm. CPSS will follow up with the patient at [facility] for further substance use recovery support and help with recovery resources. Patient is interested in getting connected with a long term residential substance use treatment program after [facility] admission. CPSS talked to the patient about [facility] and [facility] for possible residential substance use treatment center placement. Patient does not want to go [facility] in [place]. Patient was provided with [facility]PSS contact information and strongly  encouraged the patient to continue to stay in contact with [facility].   Mason Jim, [place]   10/15/2017 1:01 PM

## 2017-10-15 NOTE — ED Notes (Signed)
BH to bedside

## 2017-10-15 NOTE — BH Assessment (Signed)
Assessment Note  Barry Daugherty is an 47 y.o. male. Pt reports SI due to cocaine use. Pt states he has a plan to overdose on medication. Pt reports previous SI attempts. Pt states he's tired of using cocaine. Pt states he has sought SA treatment numerous times but he has been unsuccessful in seeking treatment. Pt reports previous MH inpatient treatment. Pt denies current outpatient treatment. Pt states he does not have family or friend support.   Dr. Sharma Covert recommends inpatient treatment.   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 Anxiety   . Bipolar 1 disorder (HCC)   . Brain injury (HCC)   . Depression   . Seizures (HCC)     Past Surgical History:  Procedure Laterality Date  . CHOLECYSTECTOMY    . NEPHRECTOMY      Family History: History reviewed. No pertinent family history.  Social History:  reports that he has quit smoking. His smoking use included cigarettes. He has never used smokeless tobacco. He reports that he has current or past drug history. Drugs: Cocaine and Marijuana. He reports that he does not drink alcohol.  Additional Social History:  Alcohol / Drug Use Pain Medications: please see mar Prescriptions: please see mar Over the Counter: please see mar History of alcohol / drug use?: Yes Longest period of sobriety (when/how long): 7 months Negative Consequences of Use: Financial, Legal, Personal relationships, Work / School Substance #1 Name of Substance 1: cocaine 1 - Age of First Use: unknown 1 - Amount (size/oz): unknown 1 - Frequency: reports 1st use in 7 months 1 - Duration: ongoing 1 - Last Use / Amount: 10/14/17  CIWA: CIWA-Ar BP: 138/87 Pulse Rate: 79 COWS:    Allergies:  Allergies  Allergen Reactions  . Ceftin [Cefuroxime Axetil] Hives  . Reglan [Metoclopramide] Itching  . Penicillins Hives    Home Medications:  (Not in a hospital  admission)  OB/GYN Status:  No LMP for male patient.  General Assessment Data [facility] of Assessment: [facility] TTS Assessment: In system Is this a Tele or Face-to-Face Assessment?: Face-to-Face Is this an Initial Assessment or a Re-assessment for this encounter?: Initial Assessment Marital status: Single Maiden name: NA Is patient pregnant?: No Pregnancy Status: No Living Arrangements: Other (Comment)(homeless) Can pt return to current living arrangement?: Yes Admission Status: Voluntary Is patient capable of signing voluntary admission?: Yes Referral Source: Self/Family/Friend Insurance type: SP     Crisis Care Plan Living Arrangements: Other (Comment)(homeless) Legal Guardian: Other:(self) Name of Psychiatrist: NA Name of Therapist: NA  Education Status Is patient currently in school?: No Is the patient employed, unemployed or receiving disability?: Unemployed  Risk to self with the past 6 months Suicidal Ideation: Yes-Currently Present Has patient been a risk to self within the past 6 months prior to admission? : No Suicidal Intent: No-Not Currently/Within Last 6 Months Has patient had any suicidal intent within the past 6 months prior to admission? : No Is patient at risk for suicide?: Yes Suicidal Plan?: No-Not Currently/Within Last 6 Months Has patient had any suicidal plan within the past 6 months prior to admission? : No Access to Means: Yes Specify Access to Suicidal Means: access to drugs What has been your use of drugs/alcohol within the last 12 months?: cocaine Previous Attempts/Gestures: Yes How many times?: 3 Other Self Harm Risks: NA Triggers for Past Attempts: None known Intentional Self Injurious Behavior: None Family Suicide History: No  Recent stressful life event(s): Other (Comment)(SA) Persecutory voices/beliefs?: No Depression: Yes Depression Symptoms: Tearfulness, Insomnia, Isolating, Fatigue Substance abuse history and/or treatment for  substance abuse?: Yes Suicide prevention information given to non-admitted patients: Not applicable  Risk to Others within the past 6 months Homicidal Ideation: No Does patient have any lifetime risk of violence toward others beyond the six months prior to admission? : No Thoughts of Harm to Others: No Current Homicidal Intent: No Current Homicidal Plan: No Access to Homicidal Means: No Identified Victim: NA History of harm to others?: No Assessment of Violence: None Noted Violent Behavior Description: NA Does patient have access to weapons?: No Criminal Charges Pending?: No Does patient have a court date: No Is patient on probation?: No  Psychosis Hallucinations: None noted Delusions: None noted  Mental Status Report Appearance/Hygiene: Unremarkable Eye Contact: Fair Motor Activity: Freedom of movement Speech: Logical/coherent Level of Consciousness: Alert Mood: Depressed Affect: Depressed Anxiety Level: Minimal Thought Processes: Coherent, Relevant Judgement: Unimpaired Orientation: Person, Place, Time, Situation Obsessive Compulsive Thoughts/Behaviors: None  Cognitive Functioning Concentration: Normal Memory: Recent Intact, Remote Intact Is patient IDD: No Is patient DD?: No Insight: Fair Impulse Control: Fair Appetite: Fair Have you had any weight changes? : Loss Amount of the weight change? (lbs): 10 lbs Sleep: Decreased Total Hours of Sleep: 6 Vegetative Symptoms: None  ADLScreening [facility] Assessment Services) Patient's cognitive ability adequate to safely complete daily activities?: Yes Patient able to express need for assistance with ADLs?: Yes Independently performs ADLs?: Yes (appropriate for developmental age)  Prior Inpatient Therapy Prior Inpatient Therapy: Yes Prior Therapy Dates: unknown Prior Therapy Facilty/Provider(s): in [place] Reason for Treatment: SA  Prior Outpatient Therapy Prior Outpatient Therapy: No Does patient have an ACCT  team?: No Does patient have Intensive In-House Services?  : No Does patient have [facility] services? : No Does patient have P4CC services?: No  ADL Screening (condition at time of admission) Patient's cognitive ability adequate to safely complete daily activities?: Yes Is the patient deaf or have difficulty hearing?: No Does the patient have difficulty seeing, even when wearing glasses/contacts?: No Does the patient have difficulty concentrating, remembering, or making decisions?: No Patient able to express need for assistance with ADLs?: Yes Does the patient have difficulty dressing or bathing?: No Independently performs ADLs?: Yes (appropriate for developmental age) Does the patient have difficulty walking or climbing stairs?: No       Abuse/Neglect Assessment (Assessment to be complete while patient is alone) Abuse/Neglect Assessment Can Be Completed: Yes Physical Abuse: Denies Verbal Abuse: Yes, past (Comment) Sexual Abuse: Denies Exploitation of patient/patient's resources: Denies     Merchant navy officer (For Healthcare) Does Patient Have a Medical Advance Directive?: No Would patient like information on creating a medical advance directive?: No - Patient declined    Additional Information 1:1 In Past 12 Months?: No CIRT Risk: No Elopement Risk: No Does patient have medical clearance?: Yes     Disposition:  Disposition Initial Assessment Completed for this Encounter: Yes Disposition of Patient: Discharge Patient refused recommended treatment: No Mode of transportation if patient is discharged?: Walking Patient referred to: Other (Comment)(long-term SA programs)  On Site Evaluation by:   Reviewed with Physician:    Emmit Pomfret 10/15/2017 11:31 AM

## 2017-10-15 NOTE — ED Notes (Addendum)
Pt provided with breakfast tray.

## 2017-10-15 NOTE — ED Notes (Signed)
Pt resting in bed. NAD noted. Equal rise and fall of chest noted. Sitter remains at bedside.  

## 2017-10-15 NOTE — Progress Notes (Signed)
Psychoeducational Group Note  Date:  10/15/2017 Time: 2324  Group Topic/Focus:  Wrap-Up Group:   The focus of this group is to help patients review their daily goal of treatment and discuss progress on daily workbooks.  Participation Level: Did Not Attend  Participation Quality:  Not Applicable  Affect:  Not Applicable  Cognitive:  Not Applicable  Insight:  Not Applicable  Engagement in Group: Not Applicable  Additional Comments:  The patient did not attend group this evening since he was asleep in his bedroom.   Hazle Coca S 10/15/2017, 11:24 PM

## 2017-10-15 NOTE — ED Triage Notes (Signed)
Pt arrives via [facility]. [place]. Pt reports that today he became dizzy. Pt has hx of seizures and is non compliant with medications  Pt also endorses suicidal ideations with a plan to "take a bunch of pills and never wake up" Pt reports he has attempted suicide in the past this way. Pt reports that he relapsed last night after many years of sobriety and used cocaine about 9pm last night.

## 2017-10-15 NOTE — ED Notes (Signed)
Pt has 1 bookbag , 2 tote bags and 2 hospital bags of belongings. Pt reports that the bags are only clothing and there are no valuables or money in the bags.

## 2017-10-15 NOTE — Progress Notes (Signed)
Barry Daugherty is a 47 year old male pt admitted on voluntary basis. On admission he reports that he used cocaine yesterday and woke up feeling suicidal. He reports that he went to a gas station to ask them to call an ambulance, which they did, and he was subsequently brought into the hospital. He does endorse passive SI on admission but is able to contract for safety while in the hospital. He reports that he was recently in [place] and was hospitalized while there and reports that he has a supply of medications that he received from there. He reports that they gave him a bus ticket to get back to the area. He reports that he is homeless and spoke about how he would like to get into a treatment center when he gets discharged. Shiro was oriented to the unit and safety maintained.

## 2017-10-15 NOTE — BHH Suicide Risk Assessment (Cosign Needed)
Suicide Risk Assessment  Discharge Assessment   [facility] Discharge Suicide Risk Assessment   Principal Problem: <principal problem not specified> Discharge Diagnoses:  Patient Active Problem List   Diagnosis Date Noted  . MDD (major depressive disorder) [F32.9] 04/12/2015  . 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 Severe episode of recurrent major depressive disorder, without psychotic features (HCC) [F33.2]   . GAD (generalized anxiety disorder) [F41.1]     Total Time spent with patient: 30 minutes  Musculoskeletal: Strength & Muscle Tone: within normal limits Gait & Station: normal Patient leans: N/A  Psychiatric Specialty Exam:   Blood pressure 131/86, pulse 80, temperature 98.1 F (36.7 C), temperature source Oral, resp. rate 15, weight 100.7 kg (222 lb), SpO2 97 %.Body mass index is 33.75 kg/m.  General Appearance: Casual  Eye Contact::  Fair  Speech:  Clear and Coherent409  Volume:  Normal  Mood:  Depressed  Affect:  Depressed  Thought Process:  Linear and Descriptions of Associations: Intact  Orientation:  Full (Time, Place, and Person)  Thought Content:  Logical  Suicidal Thoughts:  Yes.  with intent/plan  Homicidal Thoughts:  No  Memory:  Immediate;   Fair Recent;   Fair  Judgement:  Impaired  Insight:  Shallow  Psychomotor Activity:  Normal  Concentration:  Fair  Recall:  [employer] of Knowledge:Fair  Language: Poor  Akathisia:  No  Handed:  Right  AIMS (if indicated):     Assets:  Communication Skills Desire for Improvement Financial Resources/Insurance Leisure Time Physical Health Vocational/Educational  Sleep:     Cognition: WNL  ADL's:  Intact   Mental Status Per Nursing Assessment::   On Admission:     Demographic Factors:  Male, Caucasian and Low socioeconomic status  Loss Factors: Financial problems/change in socioeconomic status  Historical Factors: Impulsivity  Risk Reduction Factors:   Positive social  support, Positive therapeutic relationship and Positive coping skills or problem solving skills  Continued Clinical Symptoms:  Severe Anxiety and/or Agitation Alcohol/Substance Abuse/Dependencies More than one psychiatric diagnosis Unstable or Poor Therapeutic Relationship Previous Psychiatric Diagnoses and Treatments  Cognitive Features That Contribute To Risk:  None    Suicide Risk:  Moderate:  Frequent suicidal ideation with limited intensity, and duration, some specificity in terms of plans, no associated intent, good self-control, limited dysphoria/symptomatology, some risk factors present, and identifiable protective factors, including available and accessible social support.    Plan Of Care/Follow-up recommendations:  Will admit to [facility] at this timw.   Truman Hayward, FNP 10/15/2017, 2:38 PM

## 2017-10-16 LAB — HEMOGLOBIN A1C
HEMOGLOBIN A1C: 5.1 % (ref 4.8–5.6)
MEAN PLASMA GLUCOSE: 99.67 mg/dL

## 2017-10-16 LAB — LIPID PANEL
Cholesterol: 182 mg/dL (ref 0–200)
HDL: 28 mg/dL — AB (ref >40)
LDL Cholesterol: 102 mg/dL — ABNORMAL HIGH (ref 0–99)
TRIGLYCERIDES: 260 mg/dL — AB (ref <150)
Total CHOL/HDL Ratio: 6.5 RATIO
VLDL: 52 mg/dL — ABNORMAL HIGH (ref 0–40)

## 2017-10-16 LAB — TSH: TSH: 2.948 u[IU]/mL (ref 0.350–4.500)

## 2017-10-16 MED ORDER — PANTOPRAZOLE SODIUM 40 MG PO TBEC
40.0000 mg | DELAYED_RELEASE_TABLET | Freq: Every day | ORAL | Status: DC
  Administered 2017-10-16 – 2017-10-27 (×12): 40 mg via ORAL
  Filled 2017-10-16 (×15): qty 1
  Filled 2017-10-16: qty 14

## 2017-10-16 MED ORDER — ATENOLOL 50 MG PO TABS
50.0000 mg | ORAL_TABLET | Freq: Every day | ORAL | Status: DC
  Administered 2017-10-16 – 2017-10-27 (×12): 50 mg via ORAL
  Filled 2017-10-16 (×6): qty 1
  Filled 2017-10-16: qty 14
  Filled 2017-10-16 (×8): qty 1

## 2017-10-16 MED ORDER — FLUOXETINE HCL 20 MG PO CAPS
20.0000 mg | ORAL_CAPSULE | Freq: Every day | ORAL | Status: DC
  Administered 2017-10-16 – 2017-10-20 (×5): 20 mg via ORAL
  Filled 2017-10-16 (×8): qty 1

## 2017-10-16 MED ORDER — BUSPIRONE HCL 15 MG PO TABS
30.0000 mg | ORAL_TABLET | Freq: Three times a day (TID) | ORAL | Status: DC
  Filled 2017-10-16 (×3): qty 2

## 2017-10-16 MED ORDER — MIRTAZAPINE 7.5 MG PO TABS
7.5000 mg | ORAL_TABLET | Freq: Every day | ORAL | Status: DC
  Administered 2017-10-17 – 2017-10-18 (×2): 7.5 mg via ORAL
  Filled 2017-10-16 (×4): qty 1

## 2017-10-16 MED ORDER — ALBUTEROL SULFATE HFA 108 (90 BASE) MCG/ACT IN AERS
1.0000 | INHALATION_SPRAY | Freq: Four times a day (QID) | RESPIRATORY_TRACT | Status: DC | PRN
  Filled 2017-10-16: qty 6.7

## 2017-10-16 MED ORDER — DIVALPROEX SODIUM ER 500 MG PO TB24
1000.0000 mg | ORAL_TABLET | Freq: Every day | ORAL | Status: DC
  Administered 2017-10-17 – 2017-10-27 (×12): 1000 mg via ORAL
  Filled 2017-10-16 (×12): qty 2
  Filled 2017-10-16: qty 28
  Filled 2017-10-16 (×4): qty 2

## 2017-10-16 MED ORDER — AMLODIPINE BESYLATE 10 MG PO TABS
10.0000 mg | ORAL_TABLET | Freq: Every day | ORAL | Status: DC
  Administered 2017-10-16 – 2017-10-27 (×12): 10 mg via ORAL
  Filled 2017-10-16 (×9): qty 1
  Filled 2017-10-16: qty 2
  Filled 2017-10-16 (×2): qty 1
  Filled 2017-10-16: qty 14
  Filled 2017-10-16 (×2): qty 1
  Filled 2017-10-16: qty 2
  Filled 2017-10-16: qty 1

## 2017-10-16 MED ORDER — BUSPIRONE HCL 15 MG PO TABS
15.0000 mg | ORAL_TABLET | Freq: Two times a day (BID) | ORAL | Status: DC
  Administered 2017-10-16 – 2017-10-27 (×23): 15 mg via ORAL
  Filled 2017-10-16 (×12): qty 1
  Filled 2017-10-16: qty 28
  Filled 2017-10-16: qty 1
  Filled 2017-10-16: qty 28
  Filled 2017-10-16 (×13): qty 1

## 2017-10-16 MED ORDER — LAMOTRIGINE 25 MG PO TABS
25.0000 mg | ORAL_TABLET | Freq: Two times a day (BID) | ORAL | Status: DC
  Administered 2017-10-16 – 2017-10-27 (×23): 25 mg via ORAL
  Filled 2017-10-16 (×17): qty 1
  Filled 2017-10-16 (×2): qty 28
  Filled 2017-10-16 (×9): qty 1

## 2017-10-16 MED ORDER — GABAPENTIN 300 MG PO CAPS
300.0000 mg | ORAL_CAPSULE | Freq: Three times a day (TID) | ORAL | Status: DC
  Administered 2017-10-16 – 2017-10-27 (×46): 300 mg via ORAL
  Filled 2017-10-16 (×11): qty 1
  Filled 2017-10-16: qty 56
  Filled 2017-10-16 (×18): qty 1
  Filled 2017-10-16: qty 56
  Filled 2017-10-16 (×23): qty 1
  Filled 2017-10-16 (×2): qty 56
  Filled 2017-10-16 (×4): qty 1
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
## 2017-10-16 NOTE — Progress Notes (Signed)
D   Pt has been resting in bed most of the shift   He has not complained of any dizziness so far this shift   He is cooperative and his behavior is appropriate  A    Verbal support offered   Medications offered    Q 15 min checks R   Pt remains safe

## 2017-10-16 NOTE — [facility] Suicide Risk Assessment (Signed)
[facility] INPATIENT:  Family/Significant Other Suicide Prevention Education  Suicide Prevention Education:  Patient Refusal for Family/Significant Other Suicide Prevention Education: The patient Barry Daugherty has refused to provide written consent for family/significant other to be provided Family/Significant Other Suicide Prevention Education during admission and/or prior to discharge.  Physician notified.  SPE completed with pt, as pt refused to consent to family contact. SPI pamphlet provided to pt and pt was encouraged to share information with support network, ask questions, and talk about any concerns relating to SPE. Pt denies access to guns/firearms and verbalized understanding of information provided. Mobile Crisis information also provided to pt.   Rona Ravens LCSW 10/16/2017, 12:50 PM

## 2017-10-16 NOTE — H&P (Addendum)
Psychiatric Admission Assessment Adult  Patient Identification: Barry Daugherty  MRN:  [id number]  Date of Evaluation:  10/16/2017  Chief Complaint: Worsening symptoms of depression, suicidal ideations, relapse on cocaine & THC.  Principal Diagnosis: MDD (major depressive disorder), recurrent severe, without psychosis (HCC)  Diagnosis:   Patient Active Problem List   Diagnosis Date Noted  . MDD (major depressive disorder), recurrent severe, without psychosis (HCC) [F33.2] 10/15/2017    Priority: High  . MDD (major depressive disorder) [F32.9] 04/12/2015  . 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 Severe episode of recurrent major depressive disorder, without psychotic features (HCC) [F33.2]   . GAD (generalized anxiety disorder) [F41.1]    History of Present Illness: This is one of several admission assessments for this 47 year old Caucasian male with hx of substance use disorder & Major depression. He is known on this unit from previous admissions all related to substance use disorder & mental illness. He is admitted to the [facility] this time from the [facility] ED with complaints of suicidal ideations with plans to overdose on medications. He is seeking mental health stabilization.  During this assessment, Barry Daugherty reports, "I was at a store yesterday when I started to feel sick. I was dizzy, not feeling really well. Then, someone saw me & called the ambulance for me. I was also feeling suicidal with plans to overdose on pills. I have been depressed x 12 years. Life has been very difficult for me. I'm disabled. I have been on medications for my depression. The last time I took the pills was a week ago. When I was taking my medicines, I was feeling better. I need to get back on those medicines. When I get very depressed like I'm now, the voices will come back. I have been hearing voices now for 2 days. I take Depakote for seizures. The last time I took them was a week  ago. I no longer have a doctor to refill my medicines".  Associated Signs/Symptoms:  Depression Symptoms:  depressed mood, insomnia, suicidal thoughts with specific plan, anxiety,  (Hypo) Manic Symptoms:  Irritable Mood, Labiality of Mood,  Anxiety Symptoms:  Excessive Worry,  Psychotic Symptoms:  Hallucinations: Auditory  PTSD Symptoms: NA  Total Time spent with patient: 1 hour  Past Psychiatric History: Major depression.  Is the patient at risk to self? Yes.    Has the patient been a risk to self in the past 6 months? Yes.    Has the patient been a risk to self within the distant past? Yes.    Is the patient a risk to others? No.  Has the patient been a risk to others in the past 6 months? No.  Has the patient been a risk to others within the distant past? No.   Prior Inpatient Therapy: Yes. Prior Outpatient Therapy: Yes ([facility]).  Alcohol Screening: 1. How often do you have a drink containing alcohol?: Monthly or less 2. How many drinks containing alcohol do you have on a typical day when you are drinking?: 1 or 2 3. How often do you have six or more drinks on one occasion?: Never AUDIT-C Score: 1 4. How often during the last year have you found that you were not able to stop drinking once you had started?: Never 5. How often during the last year have you failed to do what was normally expected from you becasue of drinking?: Never 6. How often during the last year have you needed a  first drink in the morning to get yourself going after a heavy drinking session?: Never 7. How often during the last year have you had a feeling of guilt of remorse after drinking?: Never 8. How often during the last year have you been unable to remember what happened the night before because you had been drinking?: Never 9. Have you or someone else been injured as a result of your drinking?: No 10. Has a relative or friend or a doctor or another health worker been concerned about  your drinking or suggested you cut down?: No Alcohol Use Disorder Identification Test Final Score (AUDIT): 1 Intervention/Follow-up: AUDIT Score <7 follow-up not indicated  Substance Abuse History in the last 12 months:  Yes.   Consequences of Substance Abuse: Medical Consequences:  Liver damage, Possible death by overdose Legal Consequences:  Arrests, jail time, Loss of driving privilege. Family Consequences:  Family discord, divorce and or separation.  Previous Psychotropic Medications: Yes   Psychological Evaluations: No   Past Medical History:  Past Medical History:  Diagnosis Date  . Anxiety   . Bipolar 1 disorder (HCC)   . Brain injury (HCC)   . Depression   . Diabetes mellitus without complication (HCC)   . Seizures (HCC)     Past Surgical History:  Procedure Laterality Date  . CHOLECYSTECTOMY    . NEPHRECTOMY     Family History: History reviewed. No pertinent family history.  Family Psychiatric  History:   Tobacco Screening: Have you used any form of tobacco in the last 30 days? (Cigarettes, Smokeless Tobacco, Cigars, and/or Pipes): No  Social History:  Social History   Substance and Sexual Activity  Alcohol Use No   Comment: [facility]     Social History   Substance and Sexual Activity  Drug Use Yes  . Types: Cocaine, Marijuana   Comment: last used 2 weeks ago     Additional Social History:  Allergies:   Allergies  Allergen Reactions  . Ceftin [Cefuroxime Axetil] Hives  . Reglan [Metoclopramide] Itching  . Penicillins Hives   Lab Results:  Results for orders placed or performed during the hospital encounter of 10/15/17 (from the past 48 hour(s))  Hemoglobin A1c     Status: None   Collection Time: 10/16/17  6:29 AM  Result Value Ref Range   Hgb A1c MFr Bld 5.1 4.8 - 5.6 %    Comment: (NOTE) Pre diabetes:          5.7%-6.4% Diabetes:              >6.4% Glycemic control for   <7.0% adults with diabetes    Mean Plasma Glucose 99.67 mg/dL     Comment: Performed at [facility], [address] [place], [place] [address]  Lipid panel     Status: Abnormal   Collection Time: 10/16/17  6:29 AM  Result Value Ref Range   Cholesterol 182 0 - 200 mg/dL   Triglycerides 604 (H) <150 mg/dL   HDL 28 (L) >54 mg/dL   Total CHOL/HDL Ratio 6.5 RATIO   VLDL 52 (H) 0 - 40 mg/dL   LDL Cholesterol 098 (H) 0 - 99 mg/dL    Comment:        Total Cholesterol/HDL:CHD Risk Coronary Heart Disease Risk Table                     Men   Women  1/2 Average Risk   3.4   3.3  Average  Risk       5.0   4.4  2 X Average Risk   9.6   7.1  3 X Average Risk  23.4   11.0        Use the calculated Patient Ratio above and the CHD Risk Table to determine the patient's CHD Risk.        ATP III CLASSIFICATION (LDL):  <100     mg/dL   Optimal  865-784  mg/dL   Near or Above                    Optimal  130-159  mg/dL   Borderline  696-295  mg/dL   High  >284     mg/dL   Very High Performed at [facility], [address] [place], [place] [address]   TSH     Status: None   Collection Time: 10/16/17  6:29 AM  Result Value Ref Range   TSH 2.948 0.350 - 4.500 uIU/mL    Comment: Performed by a 3rd Generation assay with a functional sensitivity of <=0.01 uIU/mL. Performed at [facility], [address] [place], [place] [address]    Blood Alcohol level:  Lab Results  Component Value Date   ETH <10 10/15/2017   ETH <5 04/11/2015   Metabolic Disorder Labs:  Lab Results  Component Value Date   HGBA1C 5.1 10/16/2017   MPG 99.67 10/16/2017   No results found for: PROLACTIN Lab Results  Component Value Date   CHOL 182 10/16/2017   TRIG 260 (H) 10/16/2017   HDL 28 (L) 10/16/2017   CHOLHDL 6.5 10/16/2017   VLDL 52 (H) 10/16/2017   LDLCALC 102 (H) 10/16/2017   Current Medications: Current Facility-Administered Medications  Medication Dose Route Frequency Provider Last Rate Last Dose  . albuterol  (PROVENTIL HFA;VENTOLIN HFA) 108 (90 Base) MCG/ACT inhaler 1-2 puff  1-2 puff Inhalation Q6H PRN Nwoko, Agnes I, NP      . alum & mag hydroxide-simeth (MAALOX/MYLANTA) 200-200-20 MG/5ML suspension 30 mL  30 mL Oral Q4H PRN Starkes, Takia S, FNP      . amLODipine (NORVASC) tablet 10 mg  10 mg Oral Daily Armandina Stammer I, NP   10 mg at 10/16/17 1145  . atenolol (TENORMIN) tablet 50 mg  50 mg Oral Daily Nwoko, Agnes I, NP      . busPIRone (BUSPAR) tablet 15 mg  15 mg Oral BID Nwoko, Agnes I, NP      . divalproex (DEPAKOTE ER) 24 hr tablet 1,000 mg  1,000 mg Oral QHS Nwoko, Agnes I, NP      . FLUoxetine (PROZAC) capsule 20 mg  20 mg Oral Daily Nwoko, Agnes I, NP   20 mg at 10/16/17 1145  . gabapentin (NEURONTIN) capsule 300 mg  300 mg Oral TID WC & HS Nwoko, Agnes I, NP   300 mg at 10/16/17 1145  . hydrOXYzine (ATARAX/VISTARIL) tablet 25 mg  25 mg Oral TID PRN Truman Hayward, FNP      . ibuprofen (ADVIL,MOTRIN) tablet 600 mg  600 mg Oral Q6H PRN Nira Conn A, NP      . lamoTRIgine (LAMICTAL) tablet 25 mg  25 mg Oral BID Nwoko, Agnes I, NP      . magnesium hydroxide (MILK OF MAGNESIA) suspension 30 mL  30 mL Oral Daily PRN Starkes, Takia S, FNP      . mirtazapine (REMERON) tablet 7.5 mg  7.5 mg Oral QHS  Armandina Stammer I, NP      . pantoprazole (PROTONIX) EC tablet 40 mg  40 mg Oral Daily Armandina Stammer I, NP   40 mg at 10/16/17 1145   PTA Medications: Medications Prior to Admission  Medication Sig Dispense Refill Last Dose  . albuterol (PROVENTIL HFA;VENTOLIN HFA) 108 (90 Base) MCG/ACT inhaler Inhale 1-2 puffs into the lungs every 6 (six) hours as needed for wheezing or shortness of breath.   > month  . amitriptyline (ELAVIL) 150 MG tablet Take 150 mg by mouth at bedtime.   Past Week at Unknown time  . amLODipine (NORVASC) 10 MG tablet Take 1 tablet (10 mg total) by mouth daily. 14 tablet 0 Past Week at Unknown time  . atenolol (TENORMIN) 50 MG tablet Take 1 tablet (50 mg total) by mouth daily. 14  tablet 0 Past Week at 8:00am  . busPIRone (BUSPAR) 30 MG tablet Take 30 mg by mouth 3 (three) times daily.   Past Week at Unknown time  . divalproex (DEPAKOTE ER) 500 MG 24 hr tablet Take 1,000 mg by mouth at bedtime.   Past Week at Unknown time  . FLUoxetine (PROZAC) 20 MG capsule Take 1 capsule (20 mg total) by mouth daily. 30 capsule 0 Past Week at Unknown time  . folic acid (FOLVITE) 1 MG tablet Take 1 mg by mouth daily.   05/21/2016 at Unknown time  . gabapentin (NEURONTIN) 300 MG capsule Take 1 capsule (300 mg total) by mouth 4 (four) times daily -  with meals and at bedtime. 120 capsule 0 Past Week at Unknown time  . ibuprofen (ADVIL,MOTRIN) 800 MG tablet Take 1 tablet (800 mg total) by mouth 3 (three) times daily. 21 tablet 0 > month  . lamoTRIgine (LAMICTAL) 25 MG tablet Take 1 tablet (25 mg total) by mouth 2 (two) times daily. 60 tablet 0 Past Week at Unknown time  . mirtazapine (REMERON) 7.5 MG tablet Take 7.5 mg by mouth at bedtime.   Past Week at Unknown time  . Multiple Vitamin (MULTIVITAMIN WITH MINERALS) TABS tablet Take 1 tablet by mouth daily.   Past Week at Unknown time  . pantoprazole (PROTONIX) 40 MG tablet Take 1 tablet (40 mg total) by mouth daily.   Past Week at Unknown time  . thiamine (VITAMIN B-1) 100 MG tablet Take 100 mg by mouth daily.   > month  . traZODone (DESYREL) 100 MG tablet Take 1 tablet (100 mg total) by mouth at bedtime as needed for sleep. 30 tablet 0 Past Week at Unknown time   Musculoskeletal: Strength & Muscle Tone: within normal limits Gait & Station: normal Patient leans: N/A  Psychiatric Specialty Exam: Physical Exam  Nursing note and vitals reviewed. Constitutional: He appears well-developed.  HENT:  Head: Normocephalic.  Eyes: Pupils are equal, round, and reactive to light.  Neck: Normal range of motion.  Cardiovascular:  Elevated blood pressure (135/101)  Respiratory: Effort normal.  GI: Soft.  Genitourinary:  Genitourinary Comments:  Deferred  Musculoskeletal: Normal range of motion.  Neurological: He is alert.  Skin: Skin is warm.    Review of Systems  Constitutional: Negative.   HENT: Negative.   Eyes: Negative.   Respiratory: Negative.  Negative for cough and shortness of breath.   Cardiovascular: Negative for chest pain and palpitations.       Elevated blood pressure (135/101)  Gastrointestinal: Negative.   Genitourinary: Negative.   Musculoskeletal: Negative.   Skin: Negative.   Neurological: Negative.   Endo/Heme/Allergies: Negative.  Psychiatric/Behavioral: Positive for depression.    Blood pressure 114/78, pulse 82, temperature 97.6 F (36.4 C), temperature source Oral, resp. rate 18, height 5\' 9"  (1.753 m), weight 99.8 kg (220 lb).Body mass index is 32.49 kg/m.  General Appearance: Casual and Fairly Groomed  Eye Contact:  Good  Speech:  Clear and Coherent and Normal Rate  Volume:  Normal  Mood:  Depressed  Affect:  Non-Congruent  Thought Process:  Coherent, Linear and Descriptions of Associations: Intact  Orientation:  Full (Time, Place, and Person)  Thought Content:  Hallucinations: Auditory  Suicidal Thoughts:  Yes.  without intent/plan  Homicidal Thoughts:  Denies  Memory:  Immediate;   Good Recent;   Good Remote;   Good  Judgement:  Good  Insight:  Good  Psychomotor Activity:  Normal  Concentration:  Concentration: Good and Attention Span: Good  Recall:  Good  Fund of Knowledge:  Good  Language:  Good  Akathisia:  Negative  Handed:  Right  AIMS (if indicated):     Assets:  Communication Skills Desire for Improvement  ADL's:  Intact  Cognition:  WNL  Sleep:  Number of Hours: 6.75   Treatment Plan Summary: Daily contact with patient to assess and evaluate symptoms and progress in treatment.  Treatment Plan/Recommendations: 1. Admit for crisis management and stabilization, estimated length of stay 3-5 days.   2. Medication management to reduce current symptoms to base line and  improve the patient's overall level of functioning: See MAR, Md's SRA & treatment plan.   Observation Level/Precautions:  15 minute checks  Laboratory:  Per ED, UDS (+) for UDS & THC  Psychotherapy:  Attend groups on unit  Medications:  Start home medications (see current medication list)  Consultations:  N/A  Discharge Concerns:  Homeless  Estimated LOS: 3-7 days  Other:  Desires sober living environment.   Physician Treatment Plan for Primary Diagnosis: MDD (major depressive disorder), recurrent severe, without psychosis (HCC)  Long Term Goal(s): Improvement in symptoms so as ready for discharge  Short Term Goals: Ability to identify changes in lifestyle to reduce recurrence of condition will improve and Ability to demonstrate self-control will improve  Physician Treatment Plan for Secondary Diagnosis: Principal Problem:   MDD (major depressive disorder), recurrent severe, without psychosis (HCC)  Long Term Goal(s): Improvement in symptoms so as ready for discharge  Short Term Goals: Ability to identify and develop effective coping behaviors will improve, Compliance with prescribed medications will improve and Ability to identify triggers associated with substance abuse/mental health issues will improve  I certify that inpatient services furnished can reasonably be expected to improve the patient's condition.    Armandina Stammer, NP, PMHNP, FNP-BC 7/4/201912:25 PM   I have reviewed the note by NP, and I am in agreement with the assessment and I have updated plan.  Patient reports past suicide attempts by overdose, cutting arm and tightening a belt around his neck.  He endorses active SI, AH to kill himself. 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   SHEILA Sherilyn Banker

## 2017-10-16 NOTE — BHH Suicide Risk Assessment (Signed)
[facility] Admission Suicide Risk Assessment   Nursing information obtained from:  Patient Demographic factors:  Male, Caucasian, Low socioeconomic status, Unemployed Current Mental Status:  Suicidal ideation indicated by patient, Self-harm thoughts Loss Factors:  Financial problems / change in socioeconomic status Historical Factors:  Prior suicide attempts, Family history of mental illness or substance abuse Risk Reduction Factors:  Positive coping skills or problem solving skills  Total Time spent with patient: 30 minutes Principal Problem: MDD (major depressive disorder), recurrent severe, without psychosis (HCC) Diagnosis:   Patient Active Problem List   Diagnosis Date Noted  . MDD (major depressive disorder), recurrent severe, without psychosis (HCC) [F33.2] 10/15/2017  . MDD (major depressive disorder) [F32.9] 04/12/2015  . 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 Severe episode of recurrent major depressive disorder, without psychotic features (HCC) [F33.2]   . GAD (generalized anxiety disorder) [F41.1]    History of Present Illness: This is one of several admission assessments for this 47 year old Caucasian male with hx of substance use disorder & Major depression. He is known on this unit from previous admissions all related to substance use disorder & mental illness. He is admitted to the [facility] this time from the [facility] ED with complaints of suicidal ideations with plans to overdose on medications. He is seeking mental health stabilization.  During this assessment, Da reports, "I was at a store yesterday when I started to feel sick. I was dizzy, not feeling really well. Then, someone saw me & called the ambulance for me. I was also feeling suicidal with plans to overdose on pills. I have been depressed x 12 years. Life has been very difficult for me. I'm disabled. I have been on medications for my depression. The last time I took the pills was a week  ago. When I was taking my medicines, I was feeling better. I need to get back on those medicines. When I get very depressed like I'm now, the voices will come back. I have been hearing voices now for 2 days. I take Depakote for seizures. The last time I took them was a week ago. I no longer have a doctor to refill my medicines".   Patient reports past suicide attempts by overdose, cutting arm and tightening a belt around his neck.  He endorses active SI, AH to kill himself. 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     Continued Clinical Symptoms:  Alcohol Use Disorder Identification Test Final Score (AUDIT): 1 The "Alcohol Use Disorders Identification Test", Guidelines for Use in Primary Care, Second Edition.  World Science writer [facility]). Score between 0-7:  no or low risk or alcohol related problems. Score between 8-15:  moderate risk of alcohol related problems. Score between 16-19:  high risk of alcohol related problems. Score 20 or above:  warrants further diagnostic evaluation for alcohol dependence and treatment.   CLINICAL FACTORS:   Depression:   Anhedonia Comorbid alcohol abuse/dependence Hopelessness Alcohol/Substance Abuse/Dependencies More than one psychiatric diagnosis Unstable or Poor Therapeutic Relationship Previous Psychiatric Diagnoses and Treatments Medical Diagnoses and Treatments/Surgeries   Musculoskeletal: Strength & Muscle Tone: within normal limits Gait & Station: normal Patient leans: N/A  Psychiatric Specialty Exam: Psychiatric Specialty Exam: Physical Exam  Nursing note and vitals reviewed. Constitutional: He appears well-developed.  HENT:  Head: Normocephalic.  Eyes: Pupils are equal, round, and reactive to light.  Neck: Normal range of motion.  Cardiovascular:  Elevated blood pressure (135/101)  Respiratory: Effort normal.  GI: Soft.  Genitourinary:  Genitourinary Comments: Deferred  Musculoskeletal: Normal range of motion.   Neurological: He is alert.  Skin: Skin is warm.    Review of Systems  Constitutional: Negative.   HENT: Negative.   Eyes: Negative.   Respiratory: Negative.  Negative for cough and shortness of breath.   Cardiovascular: Negative for chest pain and palpitations.       Elevated blood pressure (135/101)  Gastrointestinal: Negative.   Genitourinary: Negative.   Musculoskeletal: Negative.   Skin: Negative.   Neurological: Negative.   Endo/Heme/Allergies: Negative.   Psychiatric/Behavioral: Positive for depression.    Blood pressure 114/78, pulse 82, temperature 97.6 F (36.4 C), temperature source Oral, resp. rate 18, height 5\' 9"  (1.753 m), weight 99.8 kg (220 lb).Body mass index is 32.49 kg/m.  General Appearance: Casual and Fairly Groomed  Eye Contact:  Good  Speech:  Clear and Coherent and Normal Rate  Volume:  Normal  Mood:  Depressed  Affect:  Non-Congruent  Thought Process:  Coherent, Linear and Descriptions of Associations: Intact  Orientation:  Full (Time, Place, and Person)  Thought Content:  Hallucinations: Auditory  Suicidal Thoughts:  Yes.  without intent/plan  Homicidal Thoughts:  Denies  Memory:  Immediate;   Good Recent;   Good Remote;   Good  Judgement:  Good  Insight:  Good  Psychomotor Activity:  Normal  Concentration:  Concentration: Good and Attention Span: Good  Recall:  Good  Fund of Knowledge:  Good  Language:  Good  Akathisia:  Negative  Handed:  Right  AIMS (if indicated):     Assets:  Communication Skills Desire for Improvement  ADL's:  Intact  Cognition:  WNL  Sleep:  Number of Hours: 6.75    COGNITIVE FEATURES THAT CONTRIBUTE TO RISK:  None    SUICIDE RISK:   Moderate:  Frequent suicidal ideation with limited intensity, and duration, some specificity in terms of plans, no associated intent, good self-control, limited dysphoria/symptomatology, some risk factors present, and identifiable protective factors, including available and  accessible social support.  PLAN OF CARE:  Physician Treatment Plan for Primary Diagnosis: MDD (major depressive disorder), recurrent severe, without psychosis (HCC)  Long Term Goal(s): Improvement in symptoms so as ready for discharge  Short Term Goals: Ability to identify changes in lifestyle to reduce recurrence of condition will improve and Ability to demonstrate self-control will improve  Physician Treatment Plan for Secondary Diagnosis: Principal Problem:   MDD (major depressive disorder), recurrent severe, without psychosis (HCC)  Long Term Goal(s): Improvement in symptoms so as ready for discharge  Short Term Goals: Ability to identify and develop effective coping behaviors will improve, Compliance with prescribed medications will improve and Ability to identify triggers associated with substance abuse/mental health issues will improve  Patient reports past suicide attempts by overdose, cutting arm and tightening a belt around his neck.  He endorses active SI, AH to kill himself. 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    I certify that inpatient services furnished can reasonably be expected to improve the patient's condition.   Mariel Craft, MD 10/16/2017, 4:01 PM

## 2017-10-16 NOTE — Progress Notes (Signed)
While pt was the cafeteria, he c/o feeling weak and dizzy after eating [employer]s B/p 122/ 56 HR 77 resp regular. Pt assessed By Renata Caprice N.P Pt brought back to room and encouraged to rest and avoid spicy food.

## 2017-10-16 NOTE — Progress Notes (Signed)
Pt has been in his room in bed all evening.  Writer went to his room around 9:00 to speak with him.  He had just had his vital signs taken and was awake at that time.  Pt reports that he has been very depressed and just recently returned to this area from [place] where he had gone to reconnect with his family.  He said that when he got there, his family did not want anything to do with him so he ended up in the hospital there.  He says at his discharge they gave him a bus ticket to send him back to this area.  [place]  He told Clinical research associate that the other day he bought some cocaine and ask a homeless stranger if he wanted to share it.  After using it he says he regretted it and had suicidal thoughts.  He said he called the police and they took him to the ED.  Pt contracts for safety at his time.  He denies HI/AVH, although he says he does hear voices at times.  Writer asked him if her needed anything for sleep, and he said he had medications he takes at night.  Pt was informed at that time that he had no meds scheduled for night time.  He stated that he had a bag of meds with him on admission and that he takes several meds at night.  Writer told pt that she would check his list of home meds.  Writer spoke to the evening provider who deferred his home meds to the doctor in the morning.  Pt was given an order for Trazodone, but pt was asleep before writer could tell him.  Support and encouragement offered.  Discharge plans in process.  Pt wants to go to rehab at discharge.  Safety maintained with q15 minute checks.

## 2017-10-16 NOTE — Progress Notes (Signed)
Nursing Progress Note: 7-7p  D- Mood is depressed and isolative.Pt states he's homeless and has no support system and no way to get his medications. Pt admits to making poor choices in the past and was here last year. Appearance is dishevel and unkempt. Pt is able to contract for safety. Sleep and appetite are fair.  A.Support and encouragement offered, safety maintained with q 15 minutes. Educated on relapse prevention.  R-Contracts for safety and continues to follow treatment plan, working on learning new coping skills .

## 2017-10-17 DIAGNOSIS — F332 Major depressive disorder, recurrent severe without psychotic features: Principal | ICD-10-CM

## 2017-10-17 MED ORDER — MIRTAZAPINE 15 MG PO TABS
ORAL_TABLET | ORAL | Status: AC
  Administered 2017-10-17
  Filled 2017-10-17: qty 1

## 2017-10-17 NOTE — Progress Notes (Signed)
D  Pt complains of anxiety at the beginning of the shift and requested an ativan    He still complains of gas and gas pains in his abdomen    He is cooperative and pleasant on approach   He attended AA group this evening  A   Verbal support given   Medications administered and effectiveness monitored   Administered vistaril for anxiety and encouraged pt to speak with his doctor about his medications    Q 15 min checks R    Pt is  Safe at present time and agreed to talk with his doctor tomorrow

## 2017-10-17 NOTE — Progress Notes (Signed)
Pt attended AA group this evening.  

## 2017-10-17 NOTE — Tx Team (Signed)
Interdisciplinary Treatment and Diagnostic Plan Update  10/17/2017 Time of Session: [id number] Barry Daugherty MRN: [id number]  Principal Diagnosis: MDD (major depressive disorder), recurrent severe, without psychosis (HCC)  Secondary Diagnoses: Principal Problem:   MDD (major depressive disorder), recurrent severe, without psychosis (HCC)   Current Medications:  Current Facility-Administered Medications  Medication Dose Route Frequency Provider Last Rate Last Dose  . albuterol (PROVENTIL HFA;VENTOLIN HFA) 108 (90 Base) MCG/ACT inhaler 1-2 puff  1-2 puff Inhalation Q6H PRN Nwoko, Agnes I, NP      . alum & mag hydroxide-simeth (MAALOX/MYLANTA) 200-200-20 MG/5ML suspension 30 mL  30 mL Oral Q4H PRN Truman Hayward, FNP   30 mL at 10/17/17 0020  . amLODipine (NORVASC) tablet 10 mg  10 mg Oral Daily Armandina Stammer I, NP   10 mg at 10/17/17 0815  . atenolol (TENORMIN) tablet 50 mg  50 mg Oral Daily Armandina Stammer I, NP   50 mg at 10/17/17 0815  . busPIRone (BUSPAR) tablet 15 mg  15 mg Oral BID Armandina Stammer I, NP   15 mg at 10/17/17 0815  . divalproex (DEPAKOTE ER) 24 hr tablet 1,000 mg  1,000 mg Oral QHS Nwoko, Agnes I, NP   1,000 mg at 10/17/17 0015  . FLUoxetine (PROZAC) capsule 20 mg  20 mg Oral Daily Armandina Stammer I, NP   20 mg at 10/17/17 0815  . gabapentin (NEURONTIN) capsule 300 mg  300 mg Oral TID WC & HS Nwoko, Agnes I, NP   300 mg at 10/17/17 0815  . hydrOXYzine (ATARAX/VISTARIL) tablet 25 mg  25 mg Oral TID PRN Truman Hayward, FNP      . ibuprofen (ADVIL,MOTRIN) tablet 600 mg  600 mg Oral Q6H PRN Nira Conn A, NP   600 mg at 10/16/17 1549  . lamoTRIgine (LAMICTAL) tablet 25 mg  25 mg Oral BID Armandina Stammer I, NP   25 mg at 10/17/17 0815  . magnesium hydroxide (MILK OF MAGNESIA) suspension 30 mL  30 mL Oral Daily PRN Starkes, Takia S, FNP      . mirtazapine (REMERON) tablet 7.5 mg  7.5 mg Oral QHS Nwoko, Agnes I, NP      . pantoprazole (PROTONIX) EC tablet 40 mg  40 mg Oral Daily Armandina Stammer I, NP   40 mg at 10/17/17 0815   PTA Medications: Medications Prior to Admission  Medication Sig Dispense Refill Last Dose  . albuterol (PROVENTIL HFA;VENTOLIN HFA) 108 (90 Base) MCG/ACT inhaler Inhale 1-2 puffs into the lungs every 6 (six) hours as needed for wheezing or shortness of breath.   > month  . amitriptyline (ELAVIL) 150 MG tablet Take 150 mg by mouth at bedtime.   Past Week at Unknown time  . amLODipine (NORVASC) 10 MG tablet Take 1 tablet (10 mg total) by mouth daily. 14 tablet 0 Past Week at Unknown time  . atenolol (TENORMIN) 50 MG tablet Take 1 tablet (50 mg total) by mouth daily. 14 tablet 0 Past Week at 8:00am  . busPIRone (BUSPAR) 30 MG tablet Take 30 mg by mouth 3 (three) times daily.   Past Week at Unknown time  . divalproex (DEPAKOTE ER) 500 MG 24 hr tablet Take 1,000 mg by mouth at bedtime.   Past Week at Unknown time  . FLUoxetine (PROZAC) 20 MG capsule Take 1 capsule (20 mg total) by mouth daily. 30 capsule 0 Past Week at Unknown time  . folic acid (FOLVITE) 1 MG tablet Take 1 mg by mouth daily.  05/21/2016 at Unknown time  . gabapentin (NEURONTIN) 300 MG capsule Take 1 capsule (300 mg total) by mouth 4 (four) times daily -  with meals and at bedtime. 120 capsule 0 Past Week at Unknown time  . ibuprofen (ADVIL,MOTRIN) 800 MG tablet Take 1 tablet (800 mg total) by mouth 3 (three) times daily. 21 tablet 0 > month  . lamoTRIgine (LAMICTAL) 25 MG tablet Take 1 tablet (25 mg total) by mouth 2 (two) times daily. 60 tablet 0 Past Week at Unknown time  . mirtazapine (REMERON) 7.5 MG tablet Take 7.5 mg by mouth at bedtime.   Past Week at Unknown time  . Multiple Vitamin (MULTIVITAMIN WITH MINERALS) TABS tablet Take 1 tablet by mouth daily.   Past Week at Unknown time  . pantoprazole (PROTONIX) 40 MG tablet Take 1 tablet (40 mg total) by mouth daily.   Past Week at Unknown time  . thiamine (VITAMIN B-1) 100 MG tablet Take 100 mg by mouth daily.   > month  . traZODone (DESYREL)  100 MG tablet Take 1 tablet (100 mg total) by mouth at bedtime as needed for sleep. 30 tablet 0 Past Week at Unknown time    Patient Stressors: Marital or family conflict Medication change or noncompliance Substance abuse  Patient Strengths: Ability for insight Average or above average intelligence Capable of independent living General fund of knowledge  Treatment Modalities: Medication Management, Group therapy, Case management,  1 to 1 session with clinician, Psychoeducation, Recreational therapy.   Physician Treatment Plan for Primary Diagnosis: MDD (major depressive disorder), recurrent severe, without psychosis (HCC) Long Term Goal(s): Improvement in symptoms so as ready for discharge Improvement in symptoms so as ready for discharge   Short Term Goals: Ability to identify changes in lifestyle to reduce recurrence of condition will improve Ability to demonstrate self-control will improve Ability to identify and develop effective coping behaviors will improve Compliance with prescribed medications will improve Ability to identify triggers associated with substance abuse/mental health issues will improve  Medication Management: Evaluate patient's response, side effects, and tolerance of medication regimen.  Therapeutic Interventions: 1 to 1 sessions, Unit Group sessions and Medication administration.  Evaluation of Outcomes: Progressing  Physician Treatment Plan for Secondary Diagnosis: Principal Problem:   MDD (major depressive disorder), recurrent severe, without psychosis (HCC)  Long Term Goal(s): Improvement in symptoms so as ready for discharge Improvement in symptoms so as ready for discharge   Short Term Goals: Ability to identify changes in lifestyle to reduce recurrence of condition will improve Ability to demonstrate self-control will improve Ability to identify and develop effective coping behaviors will improve Compliance with prescribed medications will  improve Ability to identify triggers associated with substance abuse/mental health issues will improve     Medication Management: Evaluate patient's response, side effects, and tolerance of medication regimen.  Therapeutic Interventions: 1 to 1 sessions, Unit Group sessions and Medication administration.  Evaluation of Outcomes: Progressing   RN Treatment Plan for Primary Diagnosis: MDD (major depressive disorder), recurrent severe, without psychosis (HCC) Long Term Goal(s): Knowledge of disease and therapeutic regimen to maintain health will improve  Short Term Goals: Ability to remain free from injury will improve, Ability to verbalize feelings will improve and Ability to identify and develop effective coping behaviors will improve  Medication Management: RN will administer medications as ordered by provider, will assess and evaluate patient's response and provide education to patient for prescribed medication. RN will report any adverse and/or side effects to prescribing provider.  Therapeutic  Interventions: 1 on 1 counseling sessions, Psychoeducation, Medication administration, Evaluate responses to treatment, Monitor vital signs and CBGs as ordered, Perform/monitor CIWA, COWS, AIMS and Fall Risk screenings as ordered, Perform wound care treatments as ordered.  Evaluation of Outcomes: Progressing   LCSW Treatment Plan for Primary Diagnosis: MDD (major depressive disorder), recurrent severe, without psychosis (HCC) Long Term Goal(s): Safe transition to appropriate next level of care at discharge, Engage patient in therapeutic group addressing interpersonal concerns.  Short Term Goals: Engage patient in aftercare planning with referrals and resources, Facilitate patient progression through stages of change regarding substance use diagnoses and concerns and Identify triggers associated with mental health/substance abuse issues  Therapeutic Interventions: Assess for all discharge needs, 1  to 1 time with Social worker, Explore available resources and support systems, Assess for adequacy in community support network, Educate family and significant other(s) on suicide prevention, Complete Psychosocial Assessment, Interpersonal group therapy.  Evaluation of Outcomes: Progressing   Progress in Treatment: Attending groups: Yes. Participating in groups: Yes. Taking medication as prescribed: Yes. Toleration medication: Yes. Family/Significant other contact made: SPE completed with pt; pt declined to consent to collateral contact.  Patient understands diagnosis: Yes. Discussing patient identified problems/goals with staff: Yes. Medical problems stabilized or resolved: Yes. Denies suicidal/homicidal ideation: Yes. Issues/concerns per patient self-inventory: No. Other: n/a   New problem(s) identified: No, Describe:  n/a  New Short Term/Long Term Goal(s): detox, medication management for mood stabilization; elimination of SI thoughts; development of comprehensive mental wellness/sobriety plan.   Patient Goals:  "To get help with my depression and thoughts to hurt myself."   Discharge Plan or Barriers: CSW assessing. Pt reports he has been homeless for 22 years. He does not meet criteria for inpatient treatment and has no income for [facility] house. Pt has no current outpatient provider. CSW exploring all tangible options for pt.   Reason for Continuation of Hospitalization: Anxiety Depression Medication stabilization Suicidal ideation Withdrawal symptoms  Estimated Length of Stay: Monday, 10/20/17  Attendees: Patient: Barry Daugherty 10/17/2017 9:43 AM  Physician: Dr. Viviano Simas MD; Dr. Jola Babinski MD 10/17/2017 9:43 AM  Nursing: Clydie Braun RN; Casimiro Needle RN 10/17/2017 9:43 AM  RN Care Manager:x 10/17/2017 9:43 AM  Social Worker: Corrie Mckusick LCSW 10/17/2017 9:43 AM  Recreational Therapist: x 10/17/2017 9:43 AM  Other: Armandina Stammer NP 10/17/2017 9:43 AM  Other:  10/17/2017 9:43 AM  Other:  10/17/2017 9:43 AM    Scribe for Treatment Team: Rona Ravens, LCSW 10/17/2017 9:43 AM

## 2017-10-17 NOTE — Progress Notes (Signed)
Pt did not attend goals and orientation group this morning  

## 2017-10-17 NOTE — Plan of Care (Signed)
Pt progressing in the following metrics except those referring to sleep.  D: pt found in bed this morning. Pt pleasant and compliant with medication administration. Pt stated he slept ok. Pt has to yet fill out his self inventory. Pt provided teaching and encouragement for this. Pt denies any hi/ah/vh and verbally agrees to approach staff if these become apparent. Pt voices passive si with no plan and and again verbally contracts for safety before doing anything.  A: pt provided support and encouragement. Pt provided medications per protocol and standing orders. Q29m safety checks implemented and continued. R: pt safe on the unit. Will continue to monitor.  Problem: Education: Goal: Emotional status will improve Outcome: Progressing Goal: Mental status will improve Outcome: Progressing   Problem: Activity: Goal: Interest or engagement in activities will improve Outcome: Progressing   Problem: Coping: Goal: Ability to verbalize frustrations and anger appropriately will improve Outcome: Progressing Goal: Ability to demonstrate self-control will improve Outcome: Progressing   Problem: Health Behavior/Discharge Planning: Goal: Compliance with treatment plan for underlying cause of condition will improve Outcome: Progressing   Problem: Physical Regulation: Goal: Ability to maintain clinical measurements within normal limits will improve Outcome: Progressing   Problem: Safety: Goal: Periods of time without injury will increase Outcome: Progressing   Problem: Education: Goal: Knowledge of the prescribed therapeutic regimen will improve Outcome: Progressing   Problem: Activity: Goal: Interest or engagement in leisure activities will improve Outcome: Progressing   Problem: Coping: Goal: Will verbalize feelings Outcome: Progressing   Problem: Health Behavior/Discharge Planning: Goal: Ability to make decisions will improve Outcome: Progressing Goal: Compliance with therapeutic  regimen will improve Outcome: Progressing   Problem: Safety: Goal: Ability to disclose and discuss suicidal ideas will improve Outcome: Progressing   Problem: Self-Concept: Goal: Level of anxiety will decrease Outcome: Progressing   Problem: Education: Goal: Ability to make informed decisions regarding treatment will improve Outcome: Progressing   Problem: Medication: Goal: Compliance with prescribed medication regimen will improve Outcome: Progressing   Problem: Self-Concept: Goal: Ability to disclose and discuss suicidal ideas will improve Outcome: Progressing Goal: Will verbalize positive feelings about self Outcome: Progressing   Problem: Safety: Goal: Ability to remain free from injury will improve Outcome: Progressing

## 2017-10-17 NOTE — Progress Notes (Signed)
Recreation Therapy Notes  Date: 7.5.19 Time: 0930 Location: [address] Dayroom  Group Topic: Stress Management  Goal Area(s) Addresses:  Patient will verbalize importance of using healthy stress management.  Patient will identify positive emotions associated with healthy stress management.   Behavioral Response: Engaged  Intervention: Stress Management  Activity :  Meditation.  LRT introduced the stress management technique of meditation.  LRT played a meditation on resilience that allowed patients to focus on being able to withstand obstacles that arise.  Patients were to listen and follow along as meditation played to engage in the activity.  Education:  Stress Management, Discharge Planning.   Education Outcome: Acknowledges edcuation/In group clarification offered/Needs additional education  Clinical Observations/Feedback: Pt attended and participated in group.     Serenna Deroy, LRT/CTRS         Shaye Lagace A 10/17/2017 11:01 AM 

## 2017-10-17 NOTE — Progress Notes (Signed)
[facility] MD Progress Note  10/17/2017 11:36 AM Arshia Rondon  MRN:  [id number]  Subjective: Barry Daugherty reports, "I'm not feeling too good today. Still feeling very depressed & suicidal. My mood is not good because I can believe it that my family disowned me. I have been trying to be present during group sessions but, nothing seem to help me today. I'm taking the medicines like I'm suppose to do".  This is one of several admission assessments for this 47 year old Caucasian male with hx of substance use disorder & Major depression. He is known on this unit from previous admissions all related to substance use disorder & mental illness. He is admitted to the [facility] this time from the [facility] ED with complaints of suicidal ideations with plans to overdose on medications. He is seeking mental health stabilization. During this assessment, Barry Daugherty reports, "I was at a store yesterday when I started to feel sick. I was dizzy, not feeling really well. Then, someone saw me & called the ambulance for me. I was also feeling suicidal with plans to overdose on pills. I have been depressed x 12 years. Life has been very difficult for me. I'm disabled. I have been on medications for my depression. The last time I took the pills was a week ago. When I was taking my medicines, I was feeling better. I need to get back on those medicines.   Barry Daugherty is seen, chart reviewed. The chart findings discussed with the treatment. He presents alert, oriented x 4. He is visible on the unit, attending group sessions. He says today that he is still feeling very depressed. He says he is suicidal, denies any plans or intent to hurt himself or anyone else. Barry Daugherty is encouraged to continue his treatment regimen as recommended & give them time to get in his system as he has not been on those medications for a while. He is encouraged to continue to attend & participate in the group sessions to learned coping skills. He has agreed to continue  current plan of care already in progress, Denies any adverse effects.  Principal Problem: MDD (major depressive disorder), recurrent severe, without psychosis (HCC)  Diagnosis:   Patient Active Problem List   Diagnosis Date Noted  . MDD (major depressive disorder), recurrent severe, without psychosis (HCC) [F33.2] 10/15/2017    Priority: High  . MDD (major depressive disorder) [F32.9] 04/12/2015  . 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 Severe episode of recurrent major depressive disorder, without psychotic features (HCC) [F33.2]   . GAD (generalized anxiety disorder) [F41.1]    Total Time spent with patient: 25 minutes  Past Psychiatric History: See H&P  Past Medical History:  Past Medical History:  Diagnosis Date  . Anxiety   . Bipolar 1 disorder (HCC)   . Brain injury (HCC)   . Depression   . Diabetes mellitus without complication (HCC)   . Seizures (HCC)     Past Surgical History:  Procedure Laterality Date  . CHOLECYSTECTOMY    . NEPHRECTOMY     Family History: History reviewed. No pertinent family history.  Family Psychiatric  History: See H&P  Social History:  Social History   Substance and Sexual Activity  Alcohol Use No   Comment: [facility]     Social History   Substance and Sexual Activity  Drug Use Yes  . Types: Cocaine, Marijuana   Comment: last used 2 weeks ago     Social History   Socioeconomic  History  . Marital status: Single    Spouse name: Not on file  . Number of children: Not on file  . Years of education: Not on file  . Highest education level: Not on file  Occupational History  . Not on file  Social Needs  . Financial resource strain: Not on file  . Food insecurity:    Worry: Not on file    Inability: Not on file  . Transportation needs:    Medical: Not on file    Non-medical: Not on file  Tobacco Use  . Smoking status: Former Smoker    Types: Cigarettes  . Smokeless tobacco: Never Used   Substance and Sexual Activity  . Alcohol use: No    Comment: [facility]  . Drug use: Yes    Types: Cocaine, Marijuana    Comment: last used 2 weeks ago   . Sexual activity: Not Currently  Lifestyle  . Physical activity:    Days per week: Not on file    Minutes per session: Not on file  . Stress: Not on file  Relationships  . Social connections:    Talks on phone: Not on file    Gets together: Not on file    Attends religious service: Not on file    Active member of club or organization: Not on file    Attends meetings of clubs or organizations: Not on file    Relationship status: Not on file  Other Topics Concern  . Not on file  Social History Narrative  . Not on file   Additional Social History:   Sleep: Good  Appetite:  Good  Current Medications: Current Facility-Administered Medications  Medication Dose Route Frequency Provider Last Rate Last Dose  . albuterol (PROVENTIL HFA;VENTOLIN HFA) 108 (90 Base) MCG/ACT inhaler 1-2 puff  1-2 puff Inhalation Q6H PRN Bhavik Cabiness I, NP      . alum & mag hydroxide-simeth (MAALOX/MYLANTA) 200-200-20 MG/5ML suspension 30 mL  30 mL Oral Q4H PRN Truman Hayward, FNP   30 mL at 10/17/17 0020  . amLODipine (NORVASC) tablet 10 mg  10 mg Oral Daily Armandina Stammer I, NP   10 mg at 10/17/17 0815  . atenolol (TENORMIN) tablet 50 mg  50 mg Oral Daily Armandina Stammer I, NP   50 mg at 10/17/17 0815  . busPIRone (BUSPAR) tablet 15 mg  15 mg Oral BID Armandina Stammer I, NP   15 mg at 10/17/17 0815  . divalproex (DEPAKOTE ER) 24 hr tablet 1,000 mg  1,000 mg Oral QHS Apple Dearmas I, NP   1,000 mg at 10/17/17 0015  . FLUoxetine (PROZAC) capsule 20 mg  20 mg Oral Daily Armandina Stammer I, NP   20 mg at 10/17/17 0815  . gabapentin (NEURONTIN) capsule 300 mg  300 mg Oral TID WC & HS Rudine Rieger I, NP   300 mg at 10/17/17 0815  . hydrOXYzine (ATARAX/VISTARIL) tablet 25 mg  25 mg Oral TID PRN Truman Hayward, FNP      . ibuprofen (ADVIL,MOTRIN) tablet 600 mg   600 mg Oral Q6H PRN Nira Conn A, NP   600 mg at 10/16/17 1549  . lamoTRIgine (LAMICTAL) tablet 25 mg  25 mg Oral BID Armandina Stammer I, NP   25 mg at 10/17/17 0815  . magnesium hydroxide (MILK OF MAGNESIA) suspension 30 mL  30 mL Oral Daily PRN Starkes, Takia S, FNP      . mirtazapine (REMERON) tablet 7.5 mg  7.5  mg Oral QHS Armandina Stammer I, NP      . pantoprazole (PROTONIX) EC tablet 40 mg  40 mg Oral Daily Armandina Stammer I, NP   40 mg at 10/17/17 8366   Lab Results:  Results for orders placed or performed during the hospital encounter of 10/15/17 (from the past 48 hour(s))  Hemoglobin A1c     Status: None   Collection Time: 10/16/17  6:29 AM  Result Value Ref Range   Hgb A1c MFr Bld 5.1 4.8 - 5.6 %    Comment: (NOTE) Pre diabetes:          5.7%-6.4% Diabetes:              >6.4% Glycemic control for   <7.0% adults with diabetes    Mean Plasma Glucose 99.67 mg/dL    Comment: Performed at [facility], [address], [place], [place] [address]  Lipid panel     Status: Abnormal   Collection Time: 10/16/17  6:29 AM  Result Value Ref Range   Cholesterol 182 0 - 200 mg/dL   Triglycerides 546 (H) <150 mg/dL   HDL 28 (L) >50 mg/dL   Total CHOL/HDL Ratio 6.5 RATIO   VLDL 52 (H) 0 - 40 mg/dL   LDL Cholesterol 354 (H) 0 - 99 mg/dL    Comment:        Total Cholesterol/HDL:CHD Risk Coronary Heart Disease Risk Table                     Men   Women  1/2 Average Risk   3.4   3.3  Average Risk       5.0   4.4  2 X Average Risk   9.6   7.1  3 X Average Risk  23.4   11.0        Use the calculated Patient Ratio above and the CHD Risk Table to determine the patient's CHD Risk.        ATP III CLASSIFICATION (LDL):  <100     mg/dL   Optimal  656-812  mg/dL   Near or Above                    Optimal  130-159  mg/dL   Borderline  751-700  mg/dL   High  >174     mg/dL   Very High Performed at [facility], [address], [place], [place] [address]   TSH     Status:  None   Collection Time: 10/16/17  6:29 AM  Result Value Ref Range   TSH 2.948 0.350 - 4.500 uIU/mL    Comment: Performed by a 3rd Generation assay with a functional sensitivity of <=0.01 uIU/mL. Performed at [facility], [address], [place], [place] [address]     Blood Alcohol level:  Lab Results  Component Value Date   ETH <10 10/15/2017   ETH <5 04/11/2015   Metabolic Disorder Labs: Lab Results  Component Value Date   HGBA1C 5.1 10/16/2017   MPG 99.67 10/16/2017   No results found for: PROLACTIN Lab Results  Component Value Date   CHOL 182 10/16/2017   TRIG 260 (H) 10/16/2017   HDL 28 (L) 10/16/2017   CHOLHDL 6.5 10/16/2017   VLDL 52 (H) 10/16/2017   LDLCALC 102 (H) 10/16/2017   Physical Findings: AIMS: Facial and Oral Movements Muscles of Facial Expression: None, normal Lips and Perioral Area: None, normal Jaw:  None, normal Tongue: None, normal,Extremity Movements Upper (arms, wrists, hands, fingers): None, normal Lower (legs, knees, ankles, toes): None, normal, Trunk Movements Neck, shoulders, hips: None, normal, Overall Severity Severity of abnormal movements (highest score from questions above): None, normal Incapacitation due to abnormal movements: None, normal Patient's awareness of abnormal movements (rate only patient's report): No Awareness, Dental Status Current problems with teeth and/or dentures?: No Does patient usually wear dentures?: No  CIWA:    COWS:     Musculoskeletal: Strength & Muscle Tone: within normal limits Gait & Station: normal Patient leans: N/A  Psychiatric Specialty Exam: Physical Exam  Nursing note and vitals reviewed.   Review of Systems  Psychiatric/Behavioral: Positive for depression, substance abuse (Hx. Cocaine use disorder) and suicidal ideas (passive/fleeting thoughts). Negative for hallucinations and memory loss. The patient is nervous/anxious. The patient does not have insomnia.     Blood  pressure (!) 125/103, pulse 77, temperature 98.1 F (36.7 C), temperature source Oral, resp. rate 18, height 5\' 9"  (1.753 m), weight 99.8 kg (220 lb).Body mass index is 32.49 kg/m.  General Appearance: Casual and Fairly Groomed  Eye Contact:  Good  Speech:  Clear and Coherent and Normal Rate  Volume:  Normal  Mood:  Depressed  Affect:  Non-Congruent  Thought Process:  Coherent, Linear and Descriptions of Associations: Intact  Orientation:  Full (Time, Place, and Person)  Thought Content:  Hallucinations: Auditory  Suicidal Thoughts:  Yes.  without intent/plan  Homicidal Thoughts:  Denies  Memory:  Immediate;   Good Recent;   Good Remote;   Good  Judgement:  Good  Insight:  Good  Psychomotor Activity:  Normal  Concentration:  Concentration: Good and Attention Span: Good  Recall:  Good  Fund of Knowledge:  Good  Language:  Good  Akathisia:  Negative  Handed:  Right  AIMS (if indicated):     Assets:  Communication Skills Desire for Improvement  ADL's:  Intact  Cognition:  WNL  Sleep:  Number of Hours: 6.25     Treatment Plan Summary: Daily contact with patient to assess and evaluate symptoms and progress in treatment.  - Continue inpatient hospitalization.  - Will continue today 10/17/2017 plan as below except where it is noted.  Depression.       - Continue Prozac 20 mg po daily.       - Continue Lamictal 25 mg po daily for mood stabilization.  Anxiety.       - Continue Buspar 15 mg po bid.       - Continue Hydroxyzine 25 mg po Q 8 hours prn.  Agitation.       - Continue Gabapentin 300 mg po tid.  Insomnia.      - Continue Mirtazapine 7.5 mg po Q hs.  Other medical issues:   GERD: Continue Protonix 40 mg po daily in am.   Seizure disorder: Continue Depakote ER 1,000 mg po bid. (Obtain Depakote level on 10-20-17 am).    HTN: Continue amlodipine 10 mg po daily.              Continue atenolol 50 mg po daily.  - Patient to continue to attend group sessions.  -  Discharge disposition plan ongoing.  Armandina Stammer, NP, PMHNP, FNP-BC. 10/17/2017, 11:36 AM

## 2017-10-17 NOTE — [facility] Group Notes (Signed)
[facility] Group Notes:  (Nursing/MHT/Case Management/Adjunct)  Date:  10/17/2017  Time:  4:15 pm  Type of Therapy:  Nurse Education  Participation Level:  Active  Participation Quality:  Appropriate and Attentive  Affect:  Anxious and Appropriate  Cognitive:  Alert, Appropriate and Oriented  Insight:  Appropriate and Good  Engagement in Group:  Developing/Improving and Engaged  Modes of Intervention:  Activity  Summary of Progress/Problems: pt very active with group and the activity. Pt supportive of others in the group.   Barry Daugherty 10/17/2017, 5:40 PM

## 2017-10-18 LAB — AMYLASE: Amylase: 57 U/L (ref 28–100)

## 2017-10-18 LAB — LIPASE, BLOOD: LIPASE: 45 U/L (ref 11–51)

## 2017-10-18 MED ORDER — DICYCLOMINE HCL 10 MG PO CAPS
10.0000 mg | ORAL_CAPSULE | Freq: Three times a day (TID) | ORAL | Status: DC
  Administered 2017-10-18 – 2017-10-27 (×37): 10 mg via ORAL
  Filled 2017-10-18 (×50): qty 1

## 2017-10-18 NOTE — Progress Notes (Signed)
D. Pt presents with an anxious affect congruent with mood- observed interacting well with peers in milieu. Pt continues to complain of excessive flatulence/belching. NP notified. Per pt's self inventory, pt rates his depression, hopelessness and anxiety a 9/1/5, respectively. Pt writes that his goal today is "self" and he will "go to groups" to help meet that goal.  Pt currently denies SI/HI and AV hallucinations. A. Labs and vitals monitored. Pt compliant with medications. Pt supported emotionally and encouraged to express concerns and ask questions.   R. Pt remains safe with 15 minute checks. Will continue POC.

## 2017-10-18 NOTE — [facility] Group Notes (Signed)
LCSW Group Therapy Note  10/18/2017    10:30-11:30am   Type of Therapy and Topic:  Group Therapy: Anger and Coping Skills  Participation Level:  Active   Description of Group:   In this group, patients learned how to recognize the physical, cognitive, emotional, and behavioral responses they have to anger-provoking situations.  They identified how they usually or often react when angered, and learned how healthy and unhealthy coping skills work initially, but the unhealthy ones stop working.   They analyzed how their frequently-chosen coping skill is possibly beneficial and how it is possibly unhelpful.  The group discussed a variety of healthier coping skills that could help in resolving the actual issues, as well as how to go about planning for the the possibility of future similar situations.  Therapeutic Goals: 1. Patients will identify one thing that makes them angry and how they feel emotionally and physically, what their thoughts are or tend to be in those situations, and what healthy or unhealthy coping mechanism they typically use 2. Patients will identify how their coping technique works for them, as well as how it works against them. 3. Patients will explore possible new behaviors to use in future anger situations. 4. Patients will learn that anger itself is normal and cannot be eliminated, and that healthier coping skills can assist with resolving conflict rather than worsening situations.  Summary of Patient Progress:  The patient shared that he became angry recently at a "friend" in [place] that he trusted with his possessions and then found rifling through it.  He stated that he had an anger outburst during which he actually broke his needle (used for drugs) and then went to a facility in [place] asking for help.  He was insightful about the physical, cognitive, and emotional precursors to his anger and what actions could be healthier for him than using drugs.  Therapeutic  Modalities:   Cognitive Behavioral Therapy Motivation Interviewing  Lynnell Chad  .

## 2017-10-18 NOTE — Plan of Care (Signed)
Pt continues to progress towards goals and d/c. RN will continue to monitor.  

## 2017-10-18 NOTE — [facility] Group Notes (Signed)
[facility] Group Notes:  (Nursing)  Date:  10/18/2017  Time:  1:15 PM Type of Therapy:  Nurse Education  Participation Level:  Active  Participation Quality:  Appropriate  Affect:  Appropriate  Cognitive:  Alert  Insight:  Appropriate  Engagement in Group:  Engaged  Modes of Intervention:  Discussion and Education  Summary of Progress/Problems: Nurse led group: Goals/ Anger Management  Shela Nevin 10/18/2017, 3:45 PM

## 2017-10-18 NOTE — Progress Notes (Signed)
[facility] MD Progress Note  10/18/2017 11:48 AM Barry Daugherty  MRN:  [id number]  Subjective:  Barry Daugherty seen sitting in day room interacting with peers.  Presents flat, guarded but pleasant.  Patient has concerns with abdominal pain for the past 2 days.  Reports he was previously evaluated for GERD symptoms however has not followed up with GI.  Continues to report achy crampy pain.  NP ordered amylase and lipase pending results patient to start bentyl  for GI symptoms currently denying any suicidal or homicidal.  Denies auditory visual hallucinations.  Reports taking medication as prescribed.  Pending results of Depakote level on 10/20/2017.  Reports his mood has been improving since his been inpatient.  Reports a fair appetite.  States he is resting well.  Support encouragement and reassurance was provided.   History: This is one of several admission assessments for this 47 year old Caucasian male with hx of substance use disorder & Major depression. He is known on this unit from previous admissions all related to substance use disorder & mental illness. He is admitted to the [facility] this time from the [facility] ED with complaints of suicidal ideations with plans to overdose on medications. He is seeking mental health stabilization.   Principal Problem: MDD (major depressive disorder), recurrent severe, without psychosis (HCC) Diagnosis:   Patient Active Problem List   Diagnosis Date Noted  . MDD (major depressive disorder), recurrent severe, without psychosis (HCC) [F33.2] 10/15/2017  . MDD (major depressive disorder) [F32.9] 04/12/2015  . 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 Severe episode of recurrent major depressive disorder, without psychotic features (HCC) [F33.2]   . GAD (generalized anxiety disorder) [F41.1]    Total Time spent with patient: 20 minutes  Past Psychiatric History:   Past Medical History:  Past Medical History:  Diagnosis Date  . Anxiety   .  Bipolar 1 disorder (HCC)   . Brain injury (HCC)   . Depression   . Diabetes mellitus without complication (HCC)   . Seizures (HCC)     Past Surgical History:  Procedure Laterality Date  . CHOLECYSTECTOMY    . NEPHRECTOMY     Family History: History reviewed. No pertinent family history. Family Psychiatric  History:  Social History:  Social History   Substance and Sexual Activity  Alcohol Use No   Comment: [facility]     Social History   Substance and Sexual Activity  Drug Use Yes  . Types: Cocaine, Marijuana   Comment: last used 2 weeks ago     Social History   Socioeconomic History  . Marital status: Single    Spouse name: Not on file  . Number of children: Not on file  . Years of education: Not on file  . Highest education level: Not on file  Occupational History  . Not on file  Social Needs  . Financial resource strain: Not on file  . Food insecurity:    Worry: Not on file    Inability: Not on file  . Transportation needs:    Medical: Not on file    Non-medical: Not on file  Tobacco Use  . Smoking status: Former Smoker    Types: Cigarettes  . Smokeless tobacco: Never Used  Substance and Sexual Activity  . Alcohol use: No    Comment: [facility]  . Drug use: Yes    Types: Cocaine, Marijuana    Comment: last used 2 weeks ago   . Sexual activity: Not Currently  Lifestyle  . Physical activity:    Days per week: Not on file    Minutes per session: Not on file  . Stress: Not on file  Relationships  . Social connections:    Talks on phone: Not on file    Gets together: Not on file    Attends religious service: Not on file    Active member of club or organization: Not on file    Attends meetings of clubs or organizations: Not on file    Relationship status: Not on file  Other Topics Concern  . Not on file  Social History Narrative  . Not on file   Additional Social History:                         Sleep: Fair  Appetite:   Fair  Current Medications: Current Facility-Administered Medications  Medication Dose Route Frequency Provider Last Rate Last Dose  . albuterol (PROVENTIL HFA;VENTOLIN HFA) 108 (90 Base) MCG/ACT inhaler 1-2 puff  1-2 puff Inhalation Q6H PRN Nwoko, Agnes I, NP      . alum & mag hydroxide-simeth (MAALOX/MYLANTA) 200-200-20 MG/5ML suspension 30 mL  30 mL Oral Q4H PRN Truman Hayward, FNP   30 mL at 10/18/17 0813  . amLODipine (NORVASC) tablet 10 mg  10 mg Oral Daily Armandina Stammer I, NP   10 mg at 10/18/17 0809  . atenolol (TENORMIN) tablet 50 mg  50 mg Oral Daily Armandina Stammer I, NP   50 mg at 10/18/17 0809  . busPIRone (BUSPAR) tablet 15 mg  15 mg Oral BID Armandina Stammer I, NP   15 mg at 10/18/17 0809  . divalproex (DEPAKOTE ER) 24 hr tablet 1,000 mg  1,000 mg Oral QHS Nwoko, Agnes I, NP   1,000 mg at 10/17/17 2138  . FLUoxetine (PROZAC) capsule 20 mg  20 mg Oral Daily Armandina Stammer I, NP   20 mg at 10/18/17 0809  . gabapentin (NEURONTIN) capsule 300 mg  300 mg Oral TID WC & HS Nwoko, Agnes I, NP   300 mg at 10/18/17 0809  . hydrOXYzine (ATARAX/VISTARIL) tablet 25 mg  25 mg Oral TID PRN Truman Hayward, FNP   25 mg at 10/17/17 1935  . ibuprofen (ADVIL,MOTRIN) tablet 600 mg  600 mg Oral Q6H PRN Nira Conn A, NP   600 mg at 10/16/17 1549  . lamoTRIgine (LAMICTAL) tablet 25 mg  25 mg Oral BID Armandina Stammer I, NP   25 mg at 10/18/17 0809  . magnesium hydroxide (MILK OF MAGNESIA) suspension 30 mL  30 mL Oral Daily PRN Darcella Gasman, Takia S, FNP      . mirtazapine (REMERON) tablet 7.5 mg  7.5 mg Oral QHS Nwoko, Agnes I, NP   7.5 mg at 10/17/17 2138  . pantoprazole (PROTONIX) EC tablet 40 mg  40 mg Oral Daily Armandina Stammer I, NP   40 mg at 10/18/17 0809    Lab Results: No results found for this or any previous visit (from the past 48 hour(s)).  Blood Alcohol level:  Lab Results  Component Value Date   ETH <10 10/15/2017   ETH <5 04/11/2015    Metabolic Disorder Labs: Lab Results  Component Value Date    HGBA1C 5.1 10/16/2017   MPG 99.67 10/16/2017   No results found for: PROLACTIN Lab Results  Component Value Date   CHOL 182 10/16/2017   TRIG 260 (H) 10/16/2017   HDL 28 (L) 10/16/2017  CHOLHDL 6.5 10/16/2017   VLDL 52 (H) 10/16/2017   LDLCALC 102 (H) 10/16/2017    Physical Findings: AIMS: Facial and Oral Movements Muscles of Facial Expression: None, normal Lips and Perioral Area: None, normal Jaw: None, normal Tongue: None, normal,Extremity Movements Upper (arms, wrists, hands, fingers): None, normal Lower (legs, knees, ankles, toes): None, normal, Trunk Movements Neck, shoulders, hips: None, normal, Overall Severity Severity of abnormal movements (highest score from questions above): None, normal Incapacitation due to abnormal movements: None, normal Patient's awareness of abnormal movements (rate only patient's report): No Awareness, Dental Status Current problems with teeth and/or dentures?: No Does patient usually wear dentures?: No  CIWA:    COWS:     Musculoskeletal: Strength & Muscle Tone: within normal limits Gait & Station: normal Patient leans: N/A  Psychiatric Specialty Exam: Physical Exam  Nursing note and vitals reviewed. Constitutional: He appears well-developed.  Psychiatric: He has a normal mood and affect. His behavior is normal.    Review of Systems  Genitourinary:       Hyperactive bowel sounds.  Psychiatric/Behavioral: Positive for depression and substance abuse.  All other systems reviewed and are negative.   Blood pressure 100/71, pulse 82, temperature 98.3 F (36.8 C), temperature source Oral, resp. rate 16, height 5\' 9"  (1.753 m), weight 99.8 kg (220 lb).Body mass index is 32.49 kg/m.  General Appearance: Casual  Eye Contact:  Fair  Speech:  Clear and Coherent  Volume:  Normal  Mood:  Anxious and Depressed  Affect:  Congruent  Thought Process:  Coherent  Orientation:  Full (Time, Place, and Person)  Thought Content:   Hallucinations: None  Suicidal Thoughts:  No  Homicidal Thoughts:  No  Memory:  Immediate;   Fair Recent;   Fair Remote;   Fair  Judgement:  Fair  Insight:  Fair  Psychomotor Activity:  Normal  Concentration:  Concentration: Fair  Recall:  [employer] of Knowledge:  Fair  Language:  Fair  Akathisia:  No  Handed:  Right  AIMS (if indicated):     Assets:  Communication Skills Desire for Improvement Resilience Social Support  ADL's:  Intact  Cognition:  WNL  Sleep:  Number of Hours: 6.75     Treatment Plan Summary: Daily contact with patient to assess and evaluate symptoms and progress in treatment and Medication management   - Will continue today 10/18/2017 plan as below except where it is noted.  Depression       - Continue Prozac 20 mg po daily.       - Continue Lamictal 25 mg po daily for mood stabilization.  Anxiety.       - Continue Buspar 15 mg po bid.       - Continue Hydroxyzine 25 mg po Q 8 hours prn.  Agitation.       - Continue Gabapentin 300 mg po tid.  Insomnia.      - Continue Mirtazapine 7.5 mg po Q hs.  Other medical issues:     Seizure disorder: Continue Depakote ER 1,000 mg po bid. (Obtain Depakote level on 10-20-17 am)  Gastroenteritis symptoms:  Initiated bentyl 10 mg p.o. 3 times daily  Amylase and lipase pending results  Continue Protonix 40 mg p.o. Daily      HTN: Continue amlodipine 10 mg po daily.              Continue atenolol 50 mg po daily.  - Patient to continue to attend group sessions - Discharge disposition plan  ongoing  Oneta Rack, NP 10/18/2017, 11:48 AM

## 2017-10-19 MED ORDER — MIRTAZAPINE 15 MG PO TABS
15.0000 mg | ORAL_TABLET | Freq: Every day | ORAL | Status: DC
  Administered 2017-10-19 – 2017-10-27 (×9): 15 mg via ORAL
  Filled 2017-10-19 (×3): qty 1
  Filled 2017-10-19: qty 14
  Filled 2017-10-19 (×7): qty 1

## 2017-10-19 NOTE — Progress Notes (Signed)
Nursing note 7p-7a  Pt observed interacting positively with peers on unit this shift. Displayed a flat affect and anxious mood upon interaction with this Clinical research associate. Pt denies pain ,denies HI, and also denies visual hallucinations at this time. Pt does endorse passive SI with audio hallucinations command in type. Telling him to kill himself.  Pt is able to verbally contract for safety with this RN. Pt continues to have increased flatulence but stated that he feels the bentyl is helping with the stomach pains. Goal: "work on myself, work on family relationships, and go to rehab." Pt is now resting in bed with eyes closed, with no signs or symptoms of pain or distress noted. Pt continues to remain safe on the unit and is observed by rounding every 15 min. RN will continue to monitor.

## 2017-10-19 NOTE — [facility] Group Notes (Signed)
[facility] Group Notes:  (Nursing)  Date:  10/19/2017  Time: 1:15 PM Type of Therapy:  Nurse Education  Participation Level:  Active  Participation Quality:  Appropriate  Affect:  Appropriate  Cognitive:  Appropriate  Insight:  Appropriate  Engagement in Group:  Engaged  Modes of Intervention:  Discussion and Education  Summary of Progress/Problems: Nurse led group- discussed healthy coping skills( Relaxation techniques) Shela Nevin 10/19/2017, 5:33 PM

## 2017-10-19 NOTE — [facility] Group Notes (Signed)
[facility] LCSW Group Therapy Note  10/19/2017  10:00-11:09 AM  Type of Therapy and Topic:  Group Therapy:  Building Supports  Participation Level:  Active   Description of Group:  Patients in this group were introduced to the idea of adding a variety of healthy supports to address the various needs in their lives. Patients explored unhealthy supports as well and discussed boundary setting with those. Different types of support were defined and described, and patients were asked to think of exampled of each type of support.  Patients discussed what additional healthy supports could be helpful in their recovery and wellness after discharge in order to prevent future hospitalizations. An emphasis was placed on following up with the discharge plan when they leave the hospital in order to continue becoming healthier and happier. Patients were provided a list of possible supports to add. Discussion was had and patients were able to learn from one another. Patients were asked to complete a handout that identified the support they have given and received. Discussion was had around the importance of balancing the two.  Therapeutic Goals: 1)  demonstrate the importance of adding supports  2)  discuss 4 definitions of support  3)  identify the patient's current level of healthy support and explore their hope for future support  4)  elicit commitments to add one healthy support   Summary of Patient Progress:  The patient was engaged and active in the group session. The patient indicated their support system includes God, Jesus and church.Patient verbalized frustration with the system and wanting to get help but not meeting criteria to do so. Patient reports he struggles with following through on medications because being homeless he has other priorities and will think "I don't need it". The patient plans to put boundaries in place with his drug friends and to add new friends, a therapist and doctor to his support  systerm upon discharge from the hospital.   Therapeutic Modalities:   Motivational Interviewing Brief Solution-Focused Therapy  Shellia Cleverly

## 2017-10-19 NOTE — Progress Notes (Signed)
D. Pt presents with a sad affect congruent with mood- friendly, smiling during interactions. Pt reports that he slept poorly last night, "up every hour"-reporting that he was thinking about his mother and other relatives that have passed away. Pt reports improved abdominal gas pain, although he is still endorsing excessive flatulence at times. Pt endorse passive SI, agrees to talk to staff before acting on any harmful thoughts.  Per pt's self inventory, pt rates his depression, hopelessness and anxiety a 9/9/9, respectively. Pt writes that the most important goal to work on today is "self" and writes that he will "stay out of my head" to help him meet his goal.  A. Labs and vitals monitored. Pt compliant with medications. Pt supported emotionally and encouraged to express concerns and ask questions.   R. Pt remains safe with 15 minute checks. Will continue POC.

## 2017-10-19 NOTE — Progress Notes (Signed)
[facility] MD Progress Note  10/19/2017 5:02 PM Barry Daugherty  MRN:  [id number]  Subjective:  Barry Daugherty is awake alert oriented x3.  Seen interacting in day room with peers.  Patient is pleasant, cooperative.  Continues to express concerns with discharging to a long-term treatment facility. "  They tell me I do not meet criteria".  patient was advised to follow-up with CSW staff regarding discharge disposition.  Reports suicidal ideation however denies plan or intent.  Patient is able to contract for safety while on the unit.  Rates his depression 8 out of 10 during this assessment with it 10 being the worst.  patient reports he was told that he can follow-up at a [facility] for residential [facility]-term treatment.  Denies abdominal pain during this assessment states the Bentyl 10 mg is  help with his symptoms.  Reports a good appetite.  Amylase / lipase no additional concerns at this time.  Reviewed states he is resting well.  Support encouragement reassurance was provided.   History: This is one of several admission assessments for this 47 year old Caucasian male with hx of substance use disorder & Major depression. He is known on this unit from previous admissions all related to substance use disorder & mental illness. He is admitted to the [facility] this time from the [facility] ED with complaints of suicidal ideations with plans to overdose on medications. He is seeking mental health stabilization.   Principal Problem: MDD (major depressive disorder), recurrent severe, without psychosis (HCC) Diagnosis:   Patient Active Problem List   Diagnosis Date Noted  . MDD (major depressive disorder), recurrent severe, without psychosis (HCC) [F33.2] 10/15/2017  . MDD (major depressive disorder) [F32.9] 04/12/2015  . 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 Severe episode of recurrent major depressive disorder, without psychotic features (HCC) [F33.2]   . GAD (generalized anxiety disorder)  [F41.1]    Total Time spent with patient: 20 minutes  Past Psychiatric History:   Past Medical History:  Past Medical History:  Diagnosis Date  . Anxiety   . Bipolar 1 disorder (HCC)   . Brain injury (HCC)   . Depression   . Diabetes mellitus without complication (HCC)   . Seizures (HCC)     Past Surgical History:  Procedure Laterality Date  . CHOLECYSTECTOMY    . NEPHRECTOMY     Family History: History reviewed. No pertinent family history. Family Psychiatric  History:  Social History:  Social History   Substance and Sexual Activity  Alcohol Use No   Comment: [facility]     Social History   Substance and Sexual Activity  Drug Use Yes  . Types: Cocaine, Marijuana   Comment: last used 2 weeks ago     Social History   Socioeconomic History  . Marital status: Single    Spouse name: Not on file  . Number of children: Not on file  . Years of education: Not on file  . Highest education level: Not on file  Occupational History  . Not on file  Social Needs  . Financial resource strain: Not on file  . Food insecurity:    Worry: Not on file    Inability: Not on file  . Transportation needs:    Medical: Not on file    Non-medical: Not on file  Tobacco Use  . Smoking status: Former Smoker    Types: Cigarettes  . Smokeless tobacco: Never Used  Substance and Sexual Activity  . Alcohol use:  No    Comment: [facility]  . Drug use: Yes    Types: Cocaine, Marijuana    Comment: last used 2 weeks ago   . Sexual activity: Not Currently  Lifestyle  . Physical activity:    Days per week: Not on file    Minutes per session: Not on file  . Stress: Not on file  Relationships  . Social connections:    Talks on phone: Not on file    Gets together: Not on file    Attends religious service: Not on file    Active member of club or organization: Not on file    Attends meetings of clubs or organizations: Not on file    Relationship status: Not on file  Other Topics  Concern  . Not on file  Social History Narrative  . Not on file   Additional Social History:                         Sleep: Fair  Appetite:  Fair  Current Medications: Current Facility-Administered Medications  Medication Dose Route Frequency Provider Last Rate Last Dose  . albuterol (PROVENTIL HFA;VENTOLIN HFA) 108 (90 Base) MCG/ACT inhaler 1-2 puff  1-2 puff Inhalation Q6H PRN Nwoko, Agnes I, NP      . alum & mag hydroxide-simeth (MAALOX/MYLANTA) 200-200-20 MG/5ML suspension 30 mL  30 mL Oral Q4H PRN Truman Hayward, FNP   30 mL at 10/18/17 0813  . amLODipine (NORVASC) tablet 10 mg  10 mg Oral Daily Armandina Stammer I, NP   10 mg at 10/19/17 0818  . atenolol (TENORMIN) tablet 50 mg  50 mg Oral Daily Armandina Stammer I, NP   50 mg at 10/19/17 0817  . busPIRone (BUSPAR) tablet 15 mg  15 mg Oral BID Armandina Stammer I, NP   15 mg at 10/19/17 1628  . dicyclomine (BENTYL) capsule 10 mg  10 mg Oral TID AC & HS Oneta Rack, NP   10 mg at 10/19/17 1627  . divalproex (DEPAKOTE ER) 24 hr tablet 1,000 mg  1,000 mg Oral QHS Nwoko, Agnes I, NP   1,000 mg at 10/18/17 2158  . FLUoxetine (PROZAC) capsule 20 mg  20 mg Oral Daily Armandina Stammer I, NP   20 mg at 10/19/17 0817  . gabapentin (NEURONTIN) capsule 300 mg  300 mg Oral TID WC & HS Nwoko, Agnes I, NP   300 mg at 10/19/17 1628  . hydrOXYzine (ATARAX/VISTARIL) tablet 25 mg  25 mg Oral TID PRN Truman Hayward, FNP   25 mg at 10/17/17 1935  . ibuprofen (ADVIL,MOTRIN) tablet 600 mg  600 mg Oral Q6H PRN Nira Conn A, NP   600 mg at 10/19/17 1144  . lamoTRIgine (LAMICTAL) tablet 25 mg  25 mg Oral BID Armandina Stammer I, NP   25 mg at 10/19/17 1628  . magnesium hydroxide (MILK OF MAGNESIA) suspension 30 mL  30 mL Oral Daily PRN Starkes, Takia S, FNP      . mirtazapine (REMERON) tablet 15 mg  15 mg Oral QHS Oneta Rack, NP      . pantoprazole (PROTONIX) EC tablet 40 mg  40 mg Oral Daily Armandina Stammer I, NP   40 mg at 10/19/17 0981    Lab Results:   Results for orders placed or performed during the hospital encounter of 10/15/17 (from the past 48 hour(s))  Lipase, blood     Status: None   Collection Time:  10/18/17  7:05 PM  Result Value Ref Range   Lipase 45 11 - 51 U/L    Comment: Performed at [facility], [address] [place], [place] [address]  Amylase     Status: None   Collection Time: 10/18/17  7:05 PM  Result Value Ref Range   Amylase 57 28 - 100 U/L    Comment: Performed at [facility], [address], [place], [place] [address]    Blood Alcohol level:  Lab Results  Component Value Date   ETH <10 10/15/2017   ETH <5 04/11/2015    Metabolic Disorder Labs: Lab Results  Component Value Date   HGBA1C 5.1 10/16/2017   MPG 99.67 10/16/2017   No results found for: PROLACTIN Lab Results  Component Value Date   CHOL 182 10/16/2017   TRIG 260 (H) 10/16/2017   HDL 28 (L) 10/16/2017   CHOLHDL 6.5 10/16/2017   VLDL 52 (H) 10/16/2017   LDLCALC 102 (H) 10/16/2017    Physical Findings: AIMS: Facial and Oral Movements Muscles of Facial Expression: None, normal Lips and Perioral Area: None, normal Jaw: None, normal Tongue: None, normal,Extremity Movements Upper (arms, wrists, hands, fingers): None, normal Lower (legs, knees, ankles, toes): None, normal, Trunk Movements Neck, shoulders, hips: None, normal, Overall Severity Severity of abnormal movements (highest score from questions above): None, normal Incapacitation due to abnormal movements: None, normal Patient's awareness of abnormal movements (rate only patient's report): No Awareness, Dental Status Current problems with teeth and/or dentures?: No Does patient usually wear dentures?: No  CIWA:    COWS:     Musculoskeletal: Strength & Muscle Tone: within normal limits Gait & Station: normal Patient leans: N/A  Psychiatric Specialty Exam: Physical Exam  Nursing note and vitals reviewed. Constitutional: He appears  well-developed.  Psychiatric: He has a normal mood and affect. His behavior is normal.    Review of Systems  Genitourinary:       Hyperactive bowel sounds.  Psychiatric/Behavioral: Positive for depression and substance abuse.  All other systems reviewed and are negative.   Blood pressure 123/84, pulse 90, temperature 98.9 F (37.2 C), temperature source Oral, resp. rate 16, height 5\' 9"  (1.753 m), weight 99.8 kg (220 lb).Body mass index is 32.49 kg/m.  General Appearance: Casual  Eye Contact:  Fair  Speech:  Clear and Coherent  Volume:  Normal  Mood:  Anxious and Depressed  Affect:  Congruent  Thought Process:  Coherent  Orientation:  Full (Time, Place, and Person)  Thought Content:  Hallucinations: None  Suicidal Thoughts:  No  Homicidal Thoughts:  No  Memory:  Immediate;   Fair Recent;   Fair Remote;   Fair  Judgement:  Fair  Insight:  Fair  Psychomotor Activity:  Normal  Concentration:  Concentration: Fair  Recall:  [employer] of Knowledge:  Fair  Language:  Fair  Akathisia:  No  Handed:  Right  AIMS (if indicated):     Assets:  Communication Skills Desire for Improvement Resilience Social Support  ADL's:  Intact  Cognition:  WNL  Sleep:  Number of Hours: 4.25     Treatment Plan Summary: Daily contact with patient to assess and evaluate symptoms and progress in treatment and Medication management   - Will continue today 10/19/2017 plan as below except where it is noted.  Depression       - Continue Prozac 20 mg po daily.       - Continue Lamictal 25 mg po  daily for mood stabilization.  Anxiety.       - Continue Buspar 15 mg po bid.       - Continue Hydroxyzine 25 mg po Q 8 hours prn.  Agitation.       - Continue Gabapentin 300 mg po tid.  Insomnia.      - Continue Mirtazapine 7.5 mg po Q hs.  Other medical issues:     Seizure disorder: Continue Depakote ER 1,000 mg po bid. (Obtain Depakote level on 10-20-17 am pending   Gastroenteritis  symptoms:   continue Bentyl 10 mg p.o. 3 times daily  Amylase and lipase pending results reviewed within normal limits  Continue Protonix 40 mg p.o. Daily      HTN: Continue amlodipine 10 mg po daily.              Continue atenolol 50 mg po daily.  - Patient to continue to attend group sessions - Discharge disposition plan ongoing  Oneta Rack, NP 10/19/2017, 5:02 PM

## 2017-10-19 NOTE — Progress Notes (Signed)
Patient did attend the evening speaker AA meeting.  

## 2017-10-20 LAB — VALPROIC ACID LEVEL: VALPROIC ACID LVL: 43 ug/mL — AB (ref 50.0–100.0)

## 2017-10-20 MED ORDER — FLUOXETINE HCL 20 MG PO CAPS
40.0000 mg | ORAL_CAPSULE | Freq: Every day | ORAL | Status: DC
  Administered 2017-10-21 – 2017-10-27 (×7): 40 mg via ORAL
  Filled 2017-10-20 (×4): qty 2
  Filled 2017-10-20: qty 28
  Filled 2017-10-20 (×6): qty 2

## 2017-10-20 NOTE — Progress Notes (Signed)
Pt was observed in the dayroom, attending wrap-up group. Pt appears animated/anxious in affect and mood. Initially irritable but brightens with approach. Pt denies SI/HI/AVH at this time. Rates pain 6/10; headache. Pt states he doesn't feel good; Pt states he is withdrawing from cocaine. Pt is seen in the dayroom, playing cards with peers later in the evening. PRN ibuprofen, Maalox, and vistaril requested and given. Will continue with POC.

## 2017-10-20 NOTE — Progress Notes (Signed)
Recreation Therapy Notes  Date: 7.8.19 Time: 0930 Location: [address] Dayroom  Group Topic: Stress Management  Goal Area(s) Addresses:  Patient will verbalize importance of using healthy stress management.  Patient will identify positive emotions associated with healthy stress management.   Intervention: Stress Management  Activity :  Meditation.  LRT introduced the stress management technique of meditation.  LRT played a meditation on choice.  Patients were to listen and follow along as meditation played.  Education:  Stress Management, Discharge Planning.   Education Outcome: Acknowledges edcuation/In group clarification offered/Needs additional education  Clinical Observations/Feedback:  Pt did not attend group.     Caroll Rancher, LRT/CTRS         Lillia Abed, Jayvyn Haselton A 10/20/2017 11:51 AM

## 2017-10-20 NOTE — Progress Notes (Signed)
CSW spoke with pt individually today to discuss aftercare options. CSW informed pt that he is not eligible for [facility], as this program requires private insurance. Pt and CSW left message for [facility] in attempt to make screening. Pt is aware that he may not meet inpatient criteria due to relapse being "a few days of use" after several months of sobriety. CSW and pt also explored [employer]. "I have to have the suboxone out of my system before I can call them." CSW encouraged pt to contact [facility], as he may need to stay there while waiting for [facility] screening. Pt verbalized understanding of above.   Boyce Keltner S. Alan Ripper, MSW, LCSW Clinical Social Worker 10/20/2017 3:35 PM

## 2017-10-20 NOTE — Plan of Care (Signed)
  Problem: Education: Goal: Knowledge of [facility] Education information/materials will improve 10/20/2017 1908 by Sherryl Manges, RN Outcome: Progressing 10/20/2017 1906 by Sherryl Manges, RN Outcome: Progressing Goal: Mental status will improve Outcome: Progressing   Problem: Activity: Goal: Interest or engagement in activities will improve 10/20/2017 1908 by Sherryl Manges, RN Outcome: Progressing 10/20/2017 1906 by Sherryl Manges, RN Outcome: Progressing   Problem: Coping: Goal: Ability to demonstrate self-control will improve Outcome: Progressing   Problem: Safety: Goal: Periods of time without injury will increase 10/20/2017 1906 by Sherryl Manges, RN Outcome: Progressing 10/20/2017 1746 by Sherryl Manges, RN Outcome: Progressing

## 2017-10-20 NOTE — Progress Notes (Signed)
[facility] MD Progress Note  10/20/2017 3:06 PM Jemuel Laursen  MRN:  [id number]  Subjective: Konor reports, " My depression is a 9/10 today.  I am having thoughts of suicide by overdose.  I have done that before."    History: This is one of several admission assessments for this 47 year old Caucasian male with hx of substance use disorder & Major depression. He is known on this unit from previous admissions all related to substance use disorder & mental illness. He is admitted to the [facility] this time from the [facility] ED with complaints of suicidal ideations with plans to overdose on medications. He is seeking mental health stabilization. During this assessment, Kori reports, "I was at a store yesterday when I started to feel sick. I was dizzy, not feeling really well. Then, someone saw me & called the ambulance for me. I was also feeling suicidal with plans to overdose on pills. I have been depressed x 12 years. Life has been very difficult for me. I'm disabled. I have been on medications for my depression. The last time I took the pills was a week ago. When I was taking my medicines, I was feeling better. I need to get back on those medicines.   Levan is seen, chart reviewed. The chart findings discussed with the treatment. He presents alert, oriented x 4. He is visible on the unit, attending group sessions. He says today that he is still feeling very depressed. He says he is suicidal with a plan to overdose.  He is able to contract for safety.  He states that he does not think the medications have been helpful. Schyler is encouraged to continue his treatment regimen as recommended & give them time to get in his system as he has not been on these medications for a while. He is encouraged to continue to attend & participate in the group sessions to learned coping skills. He has agreed to continue current plan of care already in progress, Denies any adverse effects.  Principal Problem: MDD (major  depressive disorder), recurrent severe, without psychosis (HCC)  Diagnosis:   Patient Active Problem List   Diagnosis Date Noted  . MDD (major depressive disorder), recurrent severe, without psychosis (HCC) [F33.2] 10/15/2017  . MDD (major depressive disorder) [F32.9] 04/12/2015  . 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 Severe episode of recurrent major depressive disorder, without psychotic features (HCC) [F33.2]   . GAD (generalized anxiety disorder) [F41.1]    Total Time spent with patient: 35 min  Past Psychiatric History: See H&P  Past Medical History:  Past Medical History:  Diagnosis Date  . Anxiety   . Bipolar 1 disorder (HCC)   . Brain injury (HCC)   . Depression   . Diabetes mellitus without complication (HCC)   . Seizures (HCC)     Past Surgical History:  Procedure Laterality Date  . CHOLECYSTECTOMY    . NEPHRECTOMY     Family History: History reviewed. No pertinent family history.  Family Psychiatric  History: See H&P  Social History:  Social History   Substance and Sexual Activity  Alcohol Use No   Comment: [facility]     Social History   Substance and Sexual Activity  Drug Use Yes  . Types: Cocaine, Marijuana   Comment: last used 2 weeks ago     Social History   Socioeconomic History  . Marital status: Single    Spouse name: Not on file  . Number of  children: Not on file  . Years of education: Not on file  . Highest education level: Not on file  Occupational History  . Not on file  Social Needs  . Financial resource strain: Not on file  . Food insecurity:    Worry: Not on file    Inability: Not on file  . Transportation needs:    Medical: Not on file    Non-medical: Not on file  Tobacco Use  . Smoking status: Former Smoker    Types: Cigarettes  . Smokeless tobacco: Never Used  Substance and Sexual Activity  . Alcohol use: No    Comment: [facility]  . Drug use: Yes    Types: Cocaine, Marijuana     Comment: last used 2 weeks ago   . Sexual activity: Not Currently  Lifestyle  . Physical activity:    Days per week: Not on file    Minutes per session: Not on file  . Stress: Not on file  Relationships  . Social connections:    Talks on phone: Not on file    Gets together: Not on file    Attends religious service: Not on file    Active member of club or organization: Not on file    Attends meetings of clubs or organizations: Not on file    Relationship status: Not on file  Other Topics Concern  . Not on file  Social History Narrative  . Not on file   Additional Social History:   Sleep: Good  Appetite:  Good  Current Medications: Current Facility-Administered Medications  Medication Dose Route Frequency Provider Last Rate Last Dose  . albuterol (PROVENTIL HFA;VENTOLIN HFA) 108 (90 Base) MCG/ACT inhaler 1-2 puff  1-2 puff Inhalation Q6H PRN Nwoko, Agnes I, NP      . alum & mag hydroxide-simeth (MAALOX/MYLANTA) 200-200-20 MG/5ML suspension 30 mL  30 mL Oral Q4H PRN Truman Hayward, FNP   30 mL at 10/19/17 2141  . amLODipine (NORVASC) tablet 10 mg  10 mg Oral Daily Armandina Stammer I, NP   10 mg at 10/20/17 0834  . atenolol (TENORMIN) tablet 50 mg  50 mg Oral Daily Armandina Stammer I, NP   50 mg at 10/20/17 0834  . busPIRone (BUSPAR) tablet 15 mg  15 mg Oral BID Armandina Stammer I, NP   15 mg at 10/20/17 0835  . dicyclomine (BENTYL) capsule 10 mg  10 mg Oral TID AC & HS Oneta Rack, NP   10 mg at 10/20/17 1205  . divalproex (DEPAKOTE ER) 24 hr tablet 1,000 mg  1,000 mg Oral QHS Nwoko, Agnes I, NP   1,000 mg at 10/19/17 2140  . FLUoxetine (PROZAC) capsule 20 mg  20 mg Oral Daily Armandina Stammer I, NP   20 mg at 10/20/17 0835  . gabapentin (NEURONTIN) capsule 300 mg  300 mg Oral TID WC & HS Nwoko, Agnes I, NP   300 mg at 10/20/17 1205  . hydrOXYzine (ATARAX/VISTARIL) tablet 25 mg  25 mg Oral TID PRN Truman Hayward, FNP   25 mg at 10/19/17 2140  . ibuprofen (ADVIL,MOTRIN) tablet 600 mg  600  mg Oral Q6H PRN Nira Conn A, NP   600 mg at 10/19/17 2140  . lamoTRIgine (LAMICTAL) tablet 25 mg  25 mg Oral BID Armandina Stammer I, NP   25 mg at 10/20/17 0834  . magnesium hydroxide (MILK OF MAGNESIA) suspension 30 mL  30 mL Oral Daily PRN Truman Hayward, FNP      .  mirtazapine (REMERON) tablet 15 mg  15 mg Oral QHS Oneta Rack, NP   15 mg at 10/19/17 2140  . pantoprazole (PROTONIX) EC tablet 40 mg  40 mg Oral Daily Armandina Stammer I, NP   40 mg at 10/20/17 9518   Lab Results:  Results for orders placed or performed during the hospital encounter of 10/15/17 (from the past 48 hour(s))  Lipase, blood     Status: None   Collection Time: 10/18/17  7:05 PM  Result Value Ref Range   Lipase 45 11 - 51 U/L    Comment: Performed at [facility], [address], [place], [place] [address]  Amylase     Status: None   Collection Time: 10/18/17  7:05 PM  Result Value Ref Range   Amylase 57 28 - 100 U/L    Comment: Performed at [facility], [address], [place], [place] [address]  Valproic acid level     Status: Abnormal   Collection Time: 10/20/17  6:32 AM  Result Value Ref Range   Valproic Acid Lvl 43 (L) 50.0 - 100.0 ug/mL    Comment: Performed at [facility], [address] [place], [place] [address]    Blood Alcohol level:  Lab Results  Component Value Date   ETH <10 10/15/2017   ETH <5 04/11/2015   Metabolic Disorder Labs: Lab Results  Component Value Date   HGBA1C 5.1 10/16/2017   MPG 99.67 10/16/2017   No results found for: PROLACTIN Lab Results  Component Value Date   CHOL 182 10/16/2017   TRIG 260 (H) 10/16/2017   HDL 28 (L) 10/16/2017   CHOLHDL 6.5 10/16/2017   VLDL 52 (H) 10/16/2017   LDLCALC 102 (H) 10/16/2017   Physical Findings: AIMS: Facial and Oral Movements Muscles of Facial Expression: None, normal Lips and Perioral Area: None, normal Jaw: None, normal Tongue: None, normal,Extremity  Movements Upper (arms, wrists, hands, fingers): None, normal Lower (legs, knees, ankles, toes): None, normal, Trunk Movements Neck, shoulders, hips: None, normal, Overall Severity Severity of abnormal movements (highest score from questions above): None, normal Incapacitation due to abnormal movements: None, normal Patient's awareness of abnormal movements (rate only patient's report): No Awareness, Dental Status Current problems with teeth and/or dentures?: No Does patient usually wear dentures?: No  CIWA:    COWS:     Musculoskeletal: Strength & Muscle Tone: within normal limits Gait & Station: normal Patient leans: N/A  Psychiatric Specialty Exam: Physical Exam  Nursing note and vitals reviewed. Constitutional: He appears well-developed.  Psychiatric:  depresed    Review of Systems  Psychiatric/Behavioral: Positive for depression, substance abuse (Hx. Cocaine use disorder) and suicidal ideas (passive/fleeting thoughts). Negative for hallucinations and memory loss. The patient is nervous/anxious. The patient does not have insomnia.     Blood pressure 132/86, pulse 86, temperature 98.5 F (36.9 C), temperature source Oral, resp. rate 16, height 5\' 9"  (1.753 m), weight 99.8 kg (220 lb).Body mass index is 32.49 kg/m.  General Appearance: Casual and Fairly Groomed  Eye Contact:  Good  Speech:  Clear and Coherent and Normal Rate  Volume:  Normal  Mood:  Depressed  Affect:  Non-Congruent  Thought Process:  Coherent, Linear and Descriptions of Associations: Intact  Orientation:  Full (Time, Place, and Person)  Thought Content:  Hallucinations: Auditory  Suicidal Thoughts:  Yes.  without intent/plan  Homicidal Thoughts:  Denies  Memory:  Immediate;   Good Recent;   Good Remote;  Good  Judgement:  Good  Insight:  Good  Psychomotor Activity:  Normal  Concentration:  Concentration: Good and Attention Span: Good  Recall:  Good  Fund of Knowledge:  Good  Language:  Good   Akathisia:  Negative  Handed:  Right  AIMS (if indicated):     Assets:  Communication Skills Desire for Improvement  ADL's:  Intact  Cognition:  WNL  Sleep:  Number of Hours: 6.25     Treatment Plan Summary: Daily contact with patient to assess and evaluate symptoms and progress in treatment.  - Continue inpatient hospitalization.  - Will continue today 10/20/2017 plan as below except where it is noted.  Depression.       - Increase Prozac 40 mg po daily on 10/21/2017       - Continue Lamictal 25 mg po daily for mood stabilization.  Anxiety.       - Continue Buspar 15 mg po bid.       - Continue Hydroxyzine 25 mg po Q 8 hours prn.  Agitation.       - Continue Gabapentin 300 mg po tid.  Insomnia.      - Continue Mirtazapine 7.5 mg po Q hs.  Other medical issues:   GERD: Continue Protonix 40 mg po daily in am.   Seizure disorder: Continue Depakote ER 1,000 mg po bid. (Obtain Depakote level on 10-20-17 am- reviewed).    HTN: Continue amlodipine 10 mg po daily.              Continue atenolol 50 mg po daily.  - Patient to continue to attend group sessions.  - Discharge disposition plan ongoing.  Mariel Craft, MD, PMHNP, FNP-BC. 10/20/2017, 3:06 PM

## 2017-10-20 NOTE — Plan of Care (Signed)
D: Pt visible in milieu / hall on initial contact. Presents with flat affect and depressed mood on initial interactions; brightens up as shift progressed. Observed joking in dayroom with loud laughter with peers. Continues to endorse  passive SI without plan. Denies HI, AVH and pain when assessed. Pt reports good appetite, good sleep with low energy and poor concentration level. Rates his depression, hopelessness and anxiety all 9/10. Pt's goal for today is "self, I need to focus on me". A: Introduced self to pt. Emotional support and availability provided to pt. All medications administered with verbal education and effects monitored. Encouraged compliance with current treatment regimen. Safety checks maintained at Q 15 minutes intervals without outburst or self harm gestures.   R: Pt receptive to care. Compliant with medications when offered. Denies adverse drug reactions when assessed this shift. Tolerates all PO intake well. Remains safe on and off unit. POC continues for safety and mood stability.

## 2017-10-20 NOTE — Progress Notes (Signed)
Pt was observed in the room, seen resting in bed with eyes open.Pt appears depressed/anxious/irritable in affect and mood.Pt denies SI/HI/AVH at this time. Rates pain 7/10; Generalized. Pt states he feels tired and want his meds to go back to bed. Pt states he is worried about placement/living situation after discharge. Pt appears isolative to milieu today.PRN ibuprofen and vistaril requested and given. Will continue with POC.

## 2017-10-21 NOTE — Progress Notes (Signed)
[facility] MD Progress Note  10/21/2017 4:31 PM Gadge Hermiz  MRN:  [id number]  Subjective: Bernadette reports, " I don't know what I am going to do.  I have no where to go.  They said I had to go to a shelter, but I am too anxious to go to a shelter. I feel suicidal"  History: This is one of several admission assessments for this 47 year old Caucasian male with hx of substance use disorder & Major depression. He is known on this unit from previous admissions all related to substance use disorder & mental illness. He is admitted to the [facility] this time from the [facility] ED with complaints of suicidal ideations with plans to overdose on medications. He is seeking mental health stabilization. During this assessment, Kendarius reports, "I was at a store yesterday when I started to feel sick. I was dizzy, not feeling really well. Then, someone saw me & called the ambulance for me. I was also feeling suicidal with plans to overdose on pills. I have been depressed x 12 years. Life has been very difficult for me. I'm disabled. I have been on medications for my depression. The last time I took the pills was a week ago. When I was taking my medicines, I was feeling better. I need to get back on those medicines.   Ramzy is seen, chart reviewed. The chart findings discussed with the treatment. He presents alert, oriented x 4. He is visible on the unit, attending group sessions. He says today that he is still feeling very depressed. He says he is suicidal with a plan to overdose.  He is able to contract for safety while on the unit.  He states that he does not think the medications have been helpful. Floy is encouraged to continue his treatment regimen as recommended & give them time to get in his system as he has not been on these medications for a while. He is encouraged to continue to attend & participate in the group sessions to learned coping skills. Discussed shelter options and plan to discharge on Thursday. He  has agreed to continue current plan of care already in progress, Denies any adverse effects.  Principal Problem: MDD (major depressive disorder), recurrent severe, without psychosis (HCC)  Diagnosis:   Patient Active Problem List   Diagnosis Date Noted  . MDD (major depressive disorder), recurrent severe, without psychosis (HCC) [F33.2] 10/15/2017  . MDD (major depressive disorder) [F32.9] 04/12/2015  . 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 Severe episode of recurrent major depressive disorder, without psychotic features (HCC) [F33.2]   . GAD (generalized anxiety disorder) [F41.1]    Total Time spent with patient: 35 min  Past Psychiatric History: See H&P  Past Medical History:  Past Medical History:  Diagnosis Date  . Anxiety   . Bipolar 1 disorder (HCC)   . Brain injury (HCC)   . Depression   . Diabetes mellitus without complication (HCC)   . Seizures (HCC)     Past Surgical History:  Procedure Laterality Date  . CHOLECYSTECTOMY    . NEPHRECTOMY     Family History: History reviewed. No pertinent family history.  Family Psychiatric  History: See H&P  Social History:  Social History   Substance and Sexual Activity  Alcohol Use No   Comment: [facility]     Social History   Substance and Sexual Activity  Drug Use Yes  . Types: Cocaine, Marijuana   Comment: last used 2  weeks ago     Social History   Socioeconomic History  . Marital status: Single    Spouse name: Not on file  . Number of children: Not on file  . Years of education: Not on file  . Highest education level: Not on file  Occupational History  . Not on file  Social Needs  . Financial resource strain: Not on file  . Food insecurity:    Worry: Not on file    Inability: Not on file  . Transportation needs:    Medical: Not on file    Non-medical: Not on file  Tobacco Use  . Smoking status: Former Smoker    Types: Cigarettes  . Smokeless tobacco: Never Used   Substance and Sexual Activity  . Alcohol use: No    Comment: [facility]  . Drug use: Yes    Types: Cocaine, Marijuana    Comment: last used 2 weeks ago   . Sexual activity: Not Currently  Lifestyle  . Physical activity:    Days per week: Not on file    Minutes per session: Not on file  . Stress: Not on file  Relationships  . Social connections:    Talks on phone: Not on file    Gets together: Not on file    Attends religious service: Not on file    Active member of club or organization: Not on file    Attends meetings of clubs or organizations: Not on file    Relationship status: Not on file  Other Topics Concern  . Not on file  Social History Narrative  . Not on file   Additional Social History:   Sleep: Good  Appetite:  Good  Current Medications: Current Facility-Administered Medications  Medication Dose Route Frequency Provider Last Rate Last Dose  . albuterol (PROVENTIL HFA;VENTOLIN HFA) 108 (90 Base) MCG/ACT inhaler 1-2 puff  1-2 puff Inhalation Q6H PRN Nwoko, Agnes I, NP      . alum & mag hydroxide-simeth (MAALOX/MYLANTA) 200-200-20 MG/5ML suspension 30 mL  30 mL Oral Q4H PRN Truman Hayward, FNP   30 mL at 10/19/17 2141  . amLODipine (NORVASC) tablet 10 mg  10 mg Oral Daily Armandina Stammer I, NP   10 mg at 10/21/17 0817  . atenolol (TENORMIN) tablet 50 mg  50 mg Oral Daily Armandina Stammer I, NP   50 mg at 10/21/17 0817  . busPIRone (BUSPAR) tablet 15 mg  15 mg Oral BID Armandina Stammer I, NP   15 mg at 10/21/17 0817  . dicyclomine (BENTYL) capsule 10 mg  10 mg Oral TID AC & HS Oneta Rack, NP   10 mg at 10/21/17 1322  . divalproex (DEPAKOTE ER) 24 hr tablet 1,000 mg  1,000 mg Oral QHS Nwoko, Agnes I, NP   1,000 mg at 10/20/17 2128  . FLUoxetine (PROZAC) capsule 40 mg  40 mg Oral Daily Mariel Craft, MD   40 mg at 10/21/17 0817  . gabapentin (NEURONTIN) capsule 300 mg  300 mg Oral TID WC & HS Nwoko, Agnes I, NP   300 mg at 10/21/17 1322  . hydrOXYzine  (ATARAX/VISTARIL) tablet 25 mg  25 mg Oral TID PRN Truman Hayward, FNP   25 mg at 10/20/17 2128  . ibuprofen (ADVIL,MOTRIN) tablet 600 mg  600 mg Oral Q6H PRN Nira Conn A, NP   600 mg at 10/20/17 2128  . lamoTRIgine (LAMICTAL) tablet 25 mg  25 mg Oral BID Sanjuana Kava, NP  25 mg at 10/21/17 0817  . magnesium hydroxide (MILK OF MAGNESIA) suspension 30 mL  30 mL Oral Daily PRN Starkes, Takia S, FNP      . mirtazapine (REMERON) tablet 15 mg  15 mg Oral QHS Oneta Rack, NP   15 mg at 10/20/17 2128  . pantoprazole (PROTONIX) EC tablet 40 mg  40 mg Oral Daily Armandina Stammer I, NP   40 mg at 10/21/17 1610   Lab Results:  Results for orders placed or performed during the hospital encounter of 10/15/17 (from the past 48 hour(s))  Valproic acid level     Status: Abnormal   Collection Time: 10/20/17  6:32 AM  Result Value Ref Range   Valproic Acid Lvl 43 (L) 50.0 - 100.0 ug/mL    Comment: Performed at [facility], [address], [place], [place] [address]    Blood Alcohol level:  Lab Results  Component Value Date   ETH <10 10/15/2017   ETH <5 04/11/2015   Metabolic Disorder Labs: Lab Results  Component Value Date   HGBA1C 5.1 10/16/2017   MPG 99.67 10/16/2017   No results found for: PROLACTIN Lab Results  Component Value Date   CHOL 182 10/16/2017   TRIG 260 (H) 10/16/2017   HDL 28 (L) 10/16/2017   CHOLHDL 6.5 10/16/2017   VLDL 52 (H) 10/16/2017   LDLCALC 102 (H) 10/16/2017   Physical Findings: AIMS: Facial and Oral Movements Muscles of Facial Expression: None, normal Lips and Perioral Area: None, normal Jaw: None, normal Tongue: None, normal,Extremity Movements Upper (arms, wrists, hands, fingers): None, normal Lower (legs, knees, ankles, toes): None, normal, Trunk Movements Neck, shoulders, hips: None, normal, Overall Severity Severity of abnormal movements (highest score from questions above): None, normal Incapacitation due to abnormal  movements: None, normal Patient's awareness of abnormal movements (rate only patient's report): No Awareness, Dental Status Current problems with teeth and/or dentures?: No Does patient usually wear dentures?: No  CIWA:    COWS:     Musculoskeletal: Strength & Muscle Tone: within normal limits Gait & Station: normal Patient leans: N/A  Psychiatric Specialty Exam: Physical Exam  Nursing note and vitals reviewed. Constitutional: He appears well-developed.  Psychiatric:  depresed    Review of Systems  Psychiatric/Behavioral: Positive for depression, substance abuse (Hx. Cocaine use disorder) and suicidal ideas (passive/fleeting thoughts). Negative for hallucinations and memory loss. The patient is nervous/anxious. The patient does not have insomnia.     Blood pressure (!) 131/91, pulse 67, temperature 97.6 F (36.4 C), temperature source Oral, resp. rate 16, height 5\' 9"  (1.753 m), weight 99.8 kg (220 lb).Body mass index is 32.49 kg/m.  General Appearance: Casual and Fairly Groomed  Eye Contact:  Good  Speech:  Clear and Coherent and Normal Rate  Volume:  Normal  Mood:  Depressed  Affect:  Non-Congruent  Thought Process:  Coherent, Linear and Descriptions of Associations: Intact  Orientation:  Full (Time, Place, and Person)  Thought Content:  Hallucinations: Auditory  Suicidal Thoughts:  Yes.  without intent/plan  Homicidal Thoughts:  Denies  Memory:  Immediate;   Good Recent;   Good Remote;   Good  Judgement:  Good  Insight:  Good  Psychomotor Activity:  Normal  Concentration:  Concentration: Good and Attention Span: Good  Recall:  Good  Fund of Knowledge:  Good  Language:  Good  Akathisia:  Negative  Handed:  Right  AIMS (if indicated):     Assets:  Communication Skills Desire for Improvement  ADL's:  Intact  Cognition:  WNL  Sleep:  Number of Hours: 6.25     Treatment Plan Summary: Daily contact with patient to assess and evaluate symptoms and progress in  treatment.  - Continue inpatient hospitalization.  - Will continue today 10/21/2017 plan as below except where it is noted.  Depression.       - Continue Prozac 40 mg po daily on 10/21/2017       - Continue Lamictal 25 mg po daily for mood stabilization.  Anxiety.       - Continue Buspar 15 mg po bid.       - Continue Hydroxyzine 25 mg po Q 8 hours prn.  Agitation.       - Continue Gabapentin 300 mg po tid.  Insomnia.      - Continue Mirtazapine 7.5 mg po Q hs.  Other medical issues:   GERD: Continue Protonix 40 mg po daily in am.   Seizure disorder: Continue Depakote ER 1,000 mg po bid. (Obtain Depakote level on 10-20-17 am- reviewed).    HTN: Continue amlodipine 10 mg po daily.              Continue atenolol 50 mg po daily.  - Patient to continue to attend group sessions.  - Discharge disposition plan ongoing.  Mariel Craft, MD 10/21/2017, 4:31 PM

## 2017-10-21 NOTE — [facility] Group Notes (Signed)
[facility] Group Therapy 10/21/2017 1:15pm  Type of Therapy: Mental Health Association Presentation  Participation Level: Active  Participation Quality: Attentive  Affect: Appropriate  Cognitive: Oriented  Insight: Developing/Improving  Engagement in Therapy: Engaged  Modes of Intervention: Discussion, Education and Socialization  Summary of Progress/Problems: Mental Health Association (MHA) Speaker came to talk about his personal journey with mental health. The pt processed ways by which to relate to the speaker. MHA speaker provided handouts and educational information pertaining to groups and services offered by the [facility]. Pt was engaged in speaker's presentation and was receptive to resources provided.    Rona Ravens, LCSW 10/21/2017 3:19 PM

## 2017-10-21 NOTE — Progress Notes (Signed)
Patient was anxious and irritable upon approach. Patient expressed concern about getting into a program/housing after discharge. Patient was able to be verbally deescalated after medications were passed. Patient also endorsed SI, but did not have a plan and contracted for safety while in the hospital.  Patient compliant with medication administration. Safety maintained with 15 minute checks as well as environmental checks. Will continue to monitor.

## 2017-10-21 NOTE — Progress Notes (Signed)
Pt reports he had a fair day.  He states he is still having passive suicidal thoughts, but can contract for safety with staff.  He denies HI/AVH.  He voices no needs at this time.  He is not having any withdrawal symptoms at this time, but he is nervous about having to go to a shelter.  He is not sure where he will go at this time.  Support and encouragement offered.  Discharge plans are in process.  Safety maintained with q15 minute checks.

## 2017-10-21 NOTE — Plan of Care (Signed)
  Problem: Education: Goal: Knowledge of [place] [facility] Education information/materials will improve Outcome: Progressing Goal: Mental status will improve Outcome: Progressing Goal: Verbalization of understanding the information provided will improve Outcome: Progressing   Problem: Activity: Goal: Interest or engagement in activities will improve Outcome: Progressing Goal: Sleeping patterns will improve Outcome: Progressing   Problem: Coping: Goal: Ability to verbalize frustrations and anger appropriately will improve Outcome: Progressing Goal: Ability to demonstrate self-control will improve Outcome: Progressing   Problem: Health Behavior/Discharge Planning: Goal: Identification of resources available to assist in meeting health care needs will improve Outcome: Progressing Goal: Compliance with treatment plan for underlying cause of condition will improve Outcome: Progressing   Problem: Physical Regulation: Goal: Ability to maintain clinical measurements within normal limits will improve Outcome: Progressing   Problem: Safety: Goal: Periods of time without injury will increase Outcome: Progressing   Problem: Education: Goal: Utilization of techniques to improve thought processes will improve Outcome: Progressing Goal: Knowledge of the prescribed therapeutic regimen will improve Outcome: Progressing   Problem: Activity: Goal: Interest or engagement in leisure activities will improve Outcome: Progressing Goal: Imbalance in normal sleep/wake cycle will improve Outcome: Progressing   Problem: Coping: Goal: Will verbalize feelings Outcome: Progressing   Problem: Health Behavior/Discharge Planning: Goal: Ability to make decisions will improve Outcome: Progressing Goal: Compliance with therapeutic regimen will improve Outcome: Progressing   Problem: Role Relationship: Goal: Will demonstrate positive changes in social behaviors and relationships Outcome:  Progressing   Problem: Safety: Goal: Ability to disclose and discuss suicidal ideas will improve Outcome: Progressing Goal: Ability to identify and utilize support systems that promote safety will improve Outcome: Progressing   Problem: Education: Goal: Ability to make informed decisions regarding treatment will improve Outcome: Progressing   Problem: Coping: Goal: Coping ability will improve Outcome: Progressing   Problem: Health Behavior/Discharge Planning: Goal: Identification of resources available to assist in meeting health care needs will improve Outcome: Progressing   Problem: Medication: Goal: Compliance with prescribed medication regimen will improve Outcome: Progressing   Problem: Self-Concept: Goal: Ability to disclose and discuss suicidal ideas will improve Outcome: Progressing   Problem: Education: Goal: Knowledge of disease or condition will improve Outcome: Progressing Goal: Understanding of discharge needs will improve Outcome: Progressing   Problem: Health Behavior/Discharge Planning: Goal: Ability to identify changes in lifestyle to reduce recurrence of condition will improve Outcome: Progressing Goal: Identification of resources available to assist in meeting health care needs will improve Outcome: Progressing   Problem: Physical Regulation: Goal: Complications related to the disease process, condition or treatment will be avoided or minimized Outcome: Progressing   Problem: Safety: Goal: Ability to remain free from injury will improve Outcome: Progressing

## 2017-10-22 ENCOUNTER — Other Ambulatory Visit: Payer: Self-pay

## 2017-10-22 ENCOUNTER — Inpatient Hospital Stay (HOSPITAL_COMMUNITY): Payer: Federal, State, Local not specified - Other

## 2017-10-22 ENCOUNTER — Encounter (HOSPITAL_COMMUNITY): Payer: Self-pay

## 2017-10-22 LAB — CBC
HEMATOCRIT: 40.4 % (ref 39.0–52.0)
Hemoglobin: 13.2 g/dL (ref 13.0–17.0)
MCH: 29.8 pg (ref 26.0–34.0)
MCHC: 32.7 g/dL (ref 30.0–36.0)
MCV: 91.2 fL (ref 78.0–100.0)
Platelets: 330 10*3/uL (ref 150–400)
RBC: 4.43 MIL/uL (ref 4.22–5.81)
RDW: 13.9 % (ref 11.5–15.5)
WBC: 7.7 10*3/uL (ref 4.0–10.5)

## 2017-10-22 LAB — BASIC METABOLIC PANEL
Anion gap: 10 (ref 5–15)
BUN: 15 mg/dL (ref 6–20)
CHLORIDE: 107 mmol/L (ref 98–111)
CO2: 25 mmol/L (ref 22–32)
Calcium: 8.7 mg/dL — ABNORMAL LOW (ref 8.9–10.3)
Creatinine, Ser: 1.51 mg/dL — ABNORMAL HIGH (ref 0.61–1.24)
GFR calc Af Amer: >60 mL/min (ref >60)
GFR, EST NON AFRICAN AMERICAN: 55 mL/min — AB (ref >60)
GLUCOSE: 150 mg/dL — AB (ref 70–99)
POTASSIUM: 4.3 mmol/L (ref 3.5–5.1)
SODIUM: 142 mmol/L (ref 135–145)

## 2017-10-22 LAB — I-STAT TROPONIN, ED
Troponin i, poc: 0 ng/mL (ref 0.00–0.08)
Troponin i, poc: 0 ng/mL (ref 0.00–0.08)

## 2017-10-22 MED ORDER — NITROGLYCERIN 0.4 MG SL SUBL
SUBLINGUAL_TABLET | SUBLINGUAL | Status: AC
  Filled 2017-10-22: qty 1

## 2017-10-22 MED ORDER — IBUPROFEN 800 MG PO TABS
800.0000 mg | ORAL_TABLET | Freq: Once | ORAL | Status: AC
Start: 2017-10-22 — End: 2017-10-22
  Administered 2017-10-22: 800 mg via ORAL
  Filled 2017-10-22: qty 1

## 2017-10-22 MED ORDER — ASPIRIN 81 MG PO CHEW
CHEWABLE_TABLET | ORAL | Status: AC
  Filled 2017-10-22: qty 2

## 2017-10-22 MED ORDER — CYCLOBENZAPRINE HCL 10 MG PO TABS
5.0000 mg | ORAL_TABLET | Freq: Three times a day (TID) | ORAL | Status: DC | PRN
  Administered 2017-10-23 – 2017-10-24 (×2): 5 mg via ORAL
  Filled 2017-10-22 (×2): qty 1
  Filled 2017-10-22: qty 10

## 2017-10-22 MED ORDER — CYCLOBENZAPRINE HCL 10 MG PO TABS
5.0000 mg | ORAL_TABLET | Freq: Once | ORAL | Status: AC
  Administered 2017-10-22: 5 mg via ORAL
  Filled 2017-10-22: qty 1

## 2017-10-22 NOTE — Progress Notes (Signed)
Patient was pleasant upon approach. Patient claims he slept well last night and feels okay. Patient denies SI HI AVH. Patient is attending groups appropriately and is visible interacting appropriately in the dayroom.  Patient is compliant with medications prescribed per MD.  Safety maintained with 15 minute checks as well as environmental checks. Will continue to monitor.

## 2017-10-22 NOTE — ED Notes (Signed)
Pt reports intermittent thoughts of suicide, came from [facility] with sitter and no belongs other than the clothes he was wearing, T-shirt, sweat pants.

## 2017-10-22 NOTE — ED Notes (Signed)
ED Provider at bedside. 

## 2017-10-22 NOTE — Progress Notes (Signed)
Pt returned to [facility] from [facility] after being assessed for chest pain to rule out MI.  Labs and procedures came back WNL and ED RN reports that pt was medically cleared.  Upon arrival back to the unit, pt came directly to the [facility] to request his sleep aid and Depakote ER that was missed while he was at the ED.  Pt states he is feeling better although his anxiety is still high, and he is still having passive suicidal thoughts.  He is unsure when his discharge from [facility] will take place.  Pt wen to bed after receiving his medications.  Support and encouragement offered.  Safety maintained with q15 minute checks.

## 2017-10-22 NOTE — Progress Notes (Signed)
[facility] MD Progress Note  10/22/2017 1:09 PM Barry Daugherty  MRN:  [id number]  Subjective: Barry Daugherty reports, " I I'm feeling [place] little better today. My mood is improving.I would like to be discharged to [facility]".  History: This is one of several admission assessments for this 47 year old Caucasian male with hx of substance use disorder & Major depression. He is known on this unit from previous admissions all related to substance use disorder & mental illness. He is admitted to the [facility] this time from the [facility] ED with complaints of suicidal ideations with plans to overdose on medications. He is seeking mental health stabilization. During this assessment, Barry Daugherty reports, "I was at [facility] [employer] yesterday when I started to feel sick. I was dizzy, not feeling really well. Then, someone saw me & called the ambulance for me. I was also feeling suicidal with plans to overdose on pills. I have been depressed x 12 years. Life has been very difficult for me. I'm disabled. I have been on medications for my depression. The last time I took the pills was [facility] week ago. When I was taking my medicines, I was feeling better. I need to get back on those medicines.   Barry Daugherty is seen, chart reviewed. The chart findings discussed with the treatment. He presents alert, oriented x 4. His affect is flat. He is visible on the unit, attending group sessions. He says today that he is still feeling [facility] little better. He says he is not suicidal or homicidal. He is hoping to be discharged to [facility] after discharge.  He is encouraged to continue to attend & participate in the group sessions to learned coping skills. Discussed shelter options and plan to discharge on Thursday. He has agreed to continue current plan of care already in progress, Denies any adverse effects.  Principal Problem: MDD (major depressive disorder), recurrent severe, without psychosis (HCC)  Diagnosis:   Patient Active Problem List   Diagnosis Date Noted  .  MDD (major depressive disorder), recurrent severe, without psychosis (HCC) [F33.2] 10/15/2017    Priority: High  . MDD (major depressive disorder) [F32.9] 04/12/2015  . 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 Severe episode of recurrent major depressive disorder, without psychotic features (HCC) [F33.2]   . GAD (generalized anxiety disorder) [F41.1]    Total Time spent with patient: 35 min  Past Psychiatric History: See H&P  Past Medical History:  Past Medical History:  Diagnosis Date  . Anxiety   . Bipolar 1 disorder (HCC)   . Brain injury (HCC)   . Depression   . Diabetes mellitus without complication (HCC)   . Seizures (HCC)     Past Surgical History:  Procedure Laterality Date  . CHOLECYSTECTOMY    . NEPHRECTOMY     Family History: History reviewed. No pertinent family history.  Family Psychiatric  History: See H&P  Social History:  Social History   Substance and Sexual Activity  Alcohol Use No   Comment: [facility]     Social History   Substance and Sexual Activity  Drug Use Yes  . Types: Cocaine, Marijuana   Comment: last used 2 weeks ago     Social History   Socioeconomic History  . Marital status: Single    Spouse name: Not on file  . Number of children: Not on file  . Years of education: Not on file  . Highest education level: Not on file  Occupational History  . Not  on file  Social Needs  . Financial resource strain: Not on file  . Food insecurity:    Worry: Not on file    Inability: Not on file  . Transportation needs:    Medical: Not on file    Non-medical: Not on file  Tobacco Use  . Smoking status: Former Smoker    Types: Cigarettes  . Smokeless tobacco: Never Used  Substance and Sexual Activity  . Alcohol use: No    Comment: [facility]  . Drug use: Yes    Types: Cocaine, Marijuana    Comment: last used 2 weeks ago   . Sexual activity: Not Currently  Lifestyle  . Physical activity:    Days per week:  Not on file    Minutes per session: Not on file  . Stress: Not on file  Relationships  . Social connections:    Talks on phone: Not on file    Gets together: Not on file    Attends religious service: Not on file    Active member of club or organization: Not on file    Attends meetings of clubs or organizations: Not on file    Relationship status: Not on file  Other Topics Concern  . Not on file  Social History Narrative  . Not on file   Additional Social History:   Sleep: Good  Appetite:  Good  Current Medications: Current Facility-Administered Medications  Medication Dose Route Frequency Provider Last Rate Last Dose  . albuterol (PROVENTIL HFA;VENTOLIN HFA) 108 (90 Base) MCG/ACT inhaler 1-2 puff  1-2 puff Inhalation Q6H PRN Barry Daugherty I, NP      . alum & mag hydroxide-simeth (MAALOX/MYLANTA) 200-200-20 MG/5ML suspension 30 mL  30 mL Oral Q4H PRN Barry Hayward, FNP   30 mL at 10/19/17 2141  . amLODipine (NORVASC) tablet 10 mg  10 mg Oral Daily Barry Daugherty I, NP   10 mg at 10/22/17 0809  . atenolol (TENORMIN) tablet 50 mg  50 mg Oral Daily Barry Daugherty I, NP   50 mg at 10/22/17 0810  . busPIRone (BUSPAR) tablet 15 mg  15 mg Oral BID Barry Daugherty I, NP   15 mg at 10/22/17 0809  . dicyclomine (BENTYL) capsule 10 mg  10 mg Oral TID AC & HS Barry Rack, NP   10 mg at 10/22/17 1154  . divalproex (DEPAKOTE ER) 24 hr tablet 1,000 mg  1,000 mg Oral QHS Barry Daugherty I, NP   1,000 mg at 10/21/17 2117  . FLUoxetine (PROZAC) capsule 40 mg  40 mg Oral Daily Barry Craft, MD   40 mg at 10/22/17 0809  . gabapentin (NEURONTIN) capsule 300 mg  300 mg Oral TID WC & HS Barry Daugherty I, NP   300 mg at 10/22/17 1154  . hydrOXYzine (ATARAX/VISTARIL) tablet 25 mg  25 mg Oral TID PRN Barry Hayward, FNP   25 mg at 10/20/17 2128  . ibuprofen (ADVIL,MOTRIN) tablet 600 mg  600 mg Oral Q6H PRN Barry Conn A, NP   600 mg at 10/20/17 2128  . lamoTRIgine (LAMICTAL) tablet 25 mg  25 mg Oral BID  Barry Daugherty I, NP   25 mg at 10/22/17 0810  . magnesium hydroxide (MILK OF MAGNESIA) suspension 30 mL  30 mL Oral Daily PRN Barry Daugherty, Barry S, FNP      . mirtazapine (REMERON) tablet 15 mg  15 mg Oral QHS Barry Rack, NP   15 mg at 10/21/17 2118  .  pantoprazole (PROTONIX) EC tablet 40 mg  40 mg Oral Daily Barry Daugherty I, NP   40 mg at 10/22/17 1610   Lab Results:  No results found for this or any previous visit (from the past 48 hour([facility])).  Blood Alcohol level:  Lab Results  Component Value Date   ETH <10 10/15/2017   ETH <5 04/11/2015   Metabolic Disorder Labs: Lab Results  Component Value Date   HGBA1C 5.1 10/16/2017   MPG 99.67 10/16/2017   No results found for: PROLACTIN Lab Results  Component Value Date   CHOL 182 10/16/2017   TRIG 260 (H) 10/16/2017   HDL 28 (L) 10/16/2017   CHOLHDL 6.5 10/16/2017   VLDL 52 (H) 10/16/2017   LDLCALC 102 (H) 10/16/2017   Physical Findings: AIMS: Facial and Oral Movements Muscles of Facial Expression: None, normal Lips and Perioral Area: None, normal Jaw: None, normal Tongue: None, normal,Extremity Movements Upper (arms, wrists, hands, fingers): None, normal Lower (legs, knees, ankles, toes): None, normal, Trunk Movements Neck, shoulders, hips: None, normal, Overall Severity Severity of abnormal movements (highest score from questions above): None, normal Incapacitation due to abnormal movements: None, normal Patient'[place] awareness of abnormal movements (rate only patient'[facility] report): No Awareness, Dental Status Current problems with teeth and/or dentures?: No Does patient usually wear dentures?: No  CIWA:    COWS:     Musculoskeletal: Strength & Muscle Tone: within normal limits Gait & Station: normal Patient leans: N/[place]  Psychiatric Specialty Exam: Physical Exam  Nursing note and vitals reviewed. Constitutional: He appears well-developed.  Psychiatric:  depresed    Review of Systems  Psychiatric/Behavioral: Positive for  depression, substance abuse (Hx. Cocaine use disorder) and suicidal ideas (passive/fleeting thoughts). Negative for hallucinations and memory loss. The patient is nervous/anxious. The patient does not have insomnia.     Blood pressure (!) 128/93, pulse 67, temperature 98.6 F (37 C), temperature source Oral, resp. rate 18, height 5\' 9"  (1.753 m), weight 99.8 kg (220 lb), SpO2 100 %.Body mass index is 32.49 kg/m.  General Appearance: Casual and Fairly Groomed  Eye Contact:  Good  Speech:  Clear and Coherent and Normal Rate  Volume:  Normal  Mood:  Depressed  Affect:  Non-Congruent  Thought Process:  Coherent, Linear and Descriptions of Associations: Intact  Orientation:  Full (Time, Place, and Person)  Thought Content:  Hallucinations: Auditory  Suicidal Thoughts:  Yes.  without intent/plan  Homicidal Thoughts:  Denies  Memory:  Immediate;   Good Recent;   Good Remote;   Good  Judgement:  Good  Insight:  Good  Psychomotor Activity:  Normal  Concentration:  Concentration: Good and Attention Span: Good  Recall:  Good  Fund of Knowledge:  Good  Language:  Good  Akathisia:  Negative  Handed:  Right  AIMS (if indicated):     Assets:  Communication Skills Desire for Improvement  ADL'D[place]herty:  Intact  Cognition:  WNL  Sleep:  Number of Hours: 6.25     Treatment Plan Summary: Daily contact with patient to assess and evaluate symptoms and progress in treatment.  - Continue inpatient hospitalization.  - Will continue today 10/22/2017 plan as below except where it is noted.  Depression.       - Continue Prozac 40 mg po daily on 10/21/2017       - Continue Lamictal 25 mg po daily for mood stabilization.  Anxiety.       - Continue Buspar 15 mg po bid.       -  Continue Hydroxyzine 25 mg po Q 8 hours prn.  Agitation.       - Continue Gabapentin 300 mg po tid.  Insomnia.      - Continue Mirtazapine 7.5 mg po Q hs.  Other medical issues:   GERD: Continue Protonix 40 mg po daily in  am.   Seizure disorder: Continue Depakote ER 1,000 mg po bid. (Obtain Depakote level on 10-20-17 am- reviewed, result at 43).    HTN: Continue amlodipine 10 mg po daily.              Continue atenolol 50 mg po daily.  - Patient to continue to attend group sessions.  - Discharge disposition plan ongoing.  Barry Stammer, NP 10/22/2017, 1:09 PMPatient ID: [name], male   DOB: [birth year], 47 y.o.   MRN: [id number]

## 2017-10-22 NOTE — Plan of Care (Signed)
  Problem: Education: Goal: Knowledge of [facility] Education information/materials will improve Outcome: Progressing Goal: Emotional status will improve Outcome: Progressing Goal: Mental status will improve Outcome: Progressing Goal: Verbalization of understanding the information provided will improve Outcome: Progressing   Problem: Activity: Goal: Interest or engagement in activities will improve Outcome: Progressing Goal: Sleeping patterns will improve Outcome: Progressing   Problem: Coping: Goal: Ability to verbalize frustrations and anger appropriately will improve Outcome: Progressing Goal: Ability to demonstrate self-control will improve Outcome: Progressing   Problem: Health Behavior/Discharge Planning: Goal: Identification of resources available to assist in meeting health care needs will improve Outcome: Progressing Goal: Compliance with treatment plan for underlying cause of condition will improve Outcome: Progressing   Problem: Physical Regulation: Goal: Ability to maintain clinical measurements within normal limits will improve Outcome: Progressing   Problem: Safety: Goal: Periods of time without injury will increase Outcome: Progressing   Problem: Education: Goal: Utilization of techniques to improve thought processes will improve Outcome: Progressing Goal: Knowledge of the prescribed therapeutic regimen will improve Outcome: Progressing   Problem: Activity: Goal: Interest or engagement in leisure activities will improve Outcome: Progressing Goal: Imbalance in normal sleep/wake cycle will improve Outcome: Progressing   Problem: Coping: Goal: Coping ability will improve Outcome: Progressing Goal: Will verbalize feelings Outcome: Progressing   Problem: Health Behavior/Discharge Planning: Goal: Ability to make decisions will improve Outcome: Progressing Goal: Compliance with therapeutic regimen will improve Outcome: Progressing    Problem: Role Relationship: Goal: Will demonstrate positive changes in social behaviors and relationships Outcome: Progressing   Problem: Safety: Goal: Ability to disclose and discuss suicidal ideas will improve Outcome: Progressing Goal: Ability to identify and utilize support systems that promote safety will improve Outcome: Progressing   Problem: Self-Concept: Goal: Will verbalize positive feelings about self Outcome: Progressing Goal: Level of anxiety will decrease Outcome: Progressing   Problem: Education: Goal: Ability to make informed decisions regarding treatment will improve Outcome: Progressing   Problem: Coping: Goal: Coping ability will improve Outcome: Progressing   Problem: Health Behavior/Discharge Planning: Goal: Identification of resources available to assist in meeting health care needs will improve Outcome: Progressing   Problem: Medication: Goal: Compliance with prescribed medication regimen will improve Outcome: Progressing   Problem: Self-Concept: Goal: Ability to disclose and discuss suicidal ideas will improve Outcome: Progressing Goal: Will verbalize positive feelings about self Outcome: Progressing   Problem: Education: Goal: Knowledge of disease or condition will improve Outcome: Progressing Goal: Understanding of discharge needs will improve Outcome: Progressing   Problem: Health Behavior/Discharge Planning: Goal: Ability to identify changes in lifestyle to reduce recurrence of condition will improve Outcome: Progressing Goal: Identification of resources available to assist in meeting health care needs will improve Outcome: Progressing   Problem: Physical Regulation: Goal: Complications related to the disease process, condition or treatment will be avoided or minimized Outcome: Progressing   Problem: Safety: Goal: Ability to remain free from injury will improve Outcome: Progressing   

## 2017-10-22 NOTE — Discharge Instructions (Addendum)
Continue motrin for pain.   Add flexeril for muscle spasms.   You don't have a heart attack currently   Return to ER if you have worse chest pain, shortness of breath

## 2017-10-22 NOTE — Progress Notes (Addendum)
Patient ID: Barry Daugherty, male   DOB: 07-20-1970, 47 y.o.   MRN: [id number] 10/22/17 at 6,35 PM Patient reported acute, severe chest pain, described as precordial and radiating to left arm.  Patient visibly uncomfortable and in pain, rating pain as severe and worsening. 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 Reports that in the past has taken ASA ( not recently) and NTG during periods of chest pain without side effects.  Vitals are stable 121/71 , pulse 88, Pulse Ox 94 at room air Given ASA and NTG x 1. EKG was done- NSR- no  acute ischemia noted. 911/EMS contacted due to above symptoms/history.  Sallyanne Havers MD

## 2017-10-22 NOTE — ED Notes (Signed)
Pt leaving with all belongings he came with. [facility] took control of paperwork.

## 2017-10-22 NOTE — ED Provider Notes (Signed)
[facility] EMERGENCY DEPARTMENT Provider Note   CSN: [id number] Arrival date & time: 10/22/17  1900     History   Chief Complaint Chief Complaint  Patient presents with  . Chest Pain    HPI Kentarius Partington is a 47 y.o. male hx of bipolar, depression, DM, HTN here with chest pain.  Patient states that he has been having chest pain on the left side radiated to his left arm about 30 minutes prior to arrival.  Patient is actually a patient at behavioral health for depression and suicidal ideation.  Patient was admitted on 7/3 and had cocaine and marijuana in his system at that time.  Denies any heavy lifting or shortness of breath.  Patient told me that he has 7 heart attacks in the past.  However upon chart review at [facility] and [facility], patient actually had a normal coronary catheterization in 2018.  He also had multiple normal stress test in care everywhere.  He told me that he had heart attacks when he was in [place] but I was unable to verify that.   The history is provided by the patient.    Past Medical History:  Diagnosis Date  . Anxiety   . Bipolar 1 disorder (HCC)   . Brain injury (HCC)   . Depression   . Diabetes mellitus without complication (HCC)   . Hypertension   . Seizures [facility])     Patient Active Problem List   Diagnosis Date Noted  . MDD (major depressive disorder), recurrent severe, without psychosis (HCC) 10/15/2017  . MDD (major depressive disorder) 04/12/2015  . Substance induced mood disorder (HCC) 04/04/2015  . Severe episode of recurrent major depressive disorder, without psychotic features (HCC)   . GAD (generalized anxiety disorder)     Past Surgical History:  Procedure Laterality Date  . CHOLECYSTECTOMY    . NEPHRECTOMY          Home Medications    Prior to Admission medications   Medication Sig Start Date End Date Taking? Authorizing Provider  albuterol (PROVENTIL HFA;VENTOLIN HFA) 108 (90 Base) MCG/ACT inhaler Inhale 1-2  puffs into the lungs every 6 (six) hours as needed for wheezing or shortness of breath.    [provider]  amitriptyline (ELAVIL) 150 MG tablet Take 150 mg by mouth at bedtime.    [provider]  amLODipine (NORVASC) 10 MG tablet Take 1 tablet (10 mg total) by mouth daily. 04/20/15   Withrow, Everardo All, FNP  atenolol (TENORMIN) 50 MG tablet Take 1 tablet (50 mg total) by mouth daily. 04/20/15   Withrow, Everardo All, FNP  busPIRone (BUSPAR) 30 MG tablet Take 30 mg by mouth 3 (three) times daily.    [provider]  divalproex (DEPAKOTE ER) 500 MG 24 hr tablet Take 1,000 mg by mouth at bedtime.    [provider]  FLUoxetine (PROZAC) 20 MG capsule Take 1 capsule (20 mg total) by mouth daily. 04/20/15   Withrow, Everardo All, FNP  folic acid (FOLVITE) 1 MG tablet Take 1 mg by mouth daily.    [provider]  gabapentin (NEURONTIN) 300 MG capsule Take 1 capsule (300 mg total) by mouth 4 (four) times daily -  with meals and at bedtime. 04/20/15   Withrow, Everardo All, FNP  ibuprofen (ADVIL,MOTRIN) 800 MG tablet Take 1 tablet (800 mg total) by mouth 3 (three) times daily. 05/22/16   Elson Areas, PA-C  lamoTRIgine (LAMICTAL) 25 MG tablet Take 1 tablet (25 mg  total) by mouth 2 (two) times daily. 04/20/15   Withrow, Everardo All, FNP  mirtazapine (REMERON) 7.5 MG tablet Take 7.5 mg by mouth at bedtime.    [provider]  Multiple Vitamin (MULTIVITAMIN WITH MINERALS) TABS tablet Take 1 tablet by mouth daily.    [provider]  pantoprazole (PROTONIX) 40 MG tablet Take 1 tablet (40 mg total) by mouth daily. 04/20/15   Withrow, Everardo All, FNP  thiamine (VITAMIN B-1) 100 MG tablet Take 100 mg by mouth daily.    [provider]  traZODone (DESYREL) 100 MG tablet Take 1 tablet (100 mg total) by mouth at bedtime as needed for sleep. 04/20/15   Withrow, Everardo All, FNP    Family History History reviewed. No pertinent family history.  Social History Social History   Tobacco Use    . Smoking status: Former Smoker    Types: Cigarettes  . Smokeless tobacco: Never Used  Substance Use Topics  . Alcohol use: No    Comment: [facility]  . Drug use: Yes    Types: Cocaine, Marijuana    Comment: last used 2 weeks ago      Allergies   Ceftin [cefuroxime axetil]; Reglan [metoclopramide]; and Penicillins   Review of Systems Review of Systems  Cardiovascular: Positive for chest pain.  All other systems reviewed and are negative.    Physical Exam Updated Vital Signs BP 124/78 (BP Location: Right Arm)   Pulse 79   Temp 98.1 F (36.7 C) (Oral)   Resp 18   Ht 5\' 9"  (1.753 m)   Wt 99.8 kg (220 lb)   SpO2 92%   BMI 32.49 kg/m   Physical Exam  Constitutional: He is oriented to person, place, and time. He appears well-developed and well-nourished.  HENT:  Head: Normocephalic.  Eyes: Pupils are equal, round, and reactive to light. EOM are normal.  Neck: Normal range of motion. Neck supple.  Cardiovascular: Normal rate, regular rhythm and normal pulses.  Pulmonary/Chest: Effort normal and breath sounds normal.  Reproducible tenderness   Abdominal: Soft. Bowel sounds are normal.  Musculoskeletal: Normal range of motion.       Right lower leg: Normal. He exhibits no tenderness.       Left lower leg: Normal.  Neurological: He is alert and oriented to person, place, and time.  Skin: Skin is warm. Capillary refill takes less than 2 seconds.  Psychiatric: He has a normal mood and affect. His behavior is normal.  Nursing note and vitals reviewed.    ED Treatments / Results  Labs (all labs ordered are listed, but only abnormal results are displayed) Labs Reviewed  LIPID PANEL - Abnormal; Notable for the following components:      Result Value   Triglycerides 260 (*)    HDL 28 (*)    VLDL 52 (*)    LDL Cholesterol 102 (*)    All other components within normal limits  VALPROIC ACID LEVEL - Abnormal; Notable for the following components:   Valproic Acid  Lvl 43 (*)    All other components within normal limits  HEMOGLOBIN A1C  TSH  LIPASE, BLOOD  AMYLASE  BASIC METABOLIC PANEL  CBC  I-STAT TROPONIN, ED    EKG None  Radiology No results found.  Procedures Procedures (including critical care time)  Medications Ordered in ED Medications  alum & mag hydroxide-simeth (MAALOX/MYLANTA) 200-200-20 MG/5ML suspension 30 mL (30 mLs Oral Given 10/19/17 2141)  hydrOXYzine (ATARAX/VISTARIL) tablet 25 mg (25 mg Oral  Given 10/20/17 2128)  magnesium hydroxide (MILK OF MAGNESIA) suspension 30 mL (has no administration in time range)  ibuprofen (ADVIL,MOTRIN) tablet 600 mg (600 mg Oral Given 10/20/17 2128)  albuterol (PROVENTIL HFA;VENTOLIN HFA) 108 (90 Base) MCG/ACT inhaler 1-2 puff (has no administration in time range)  amLODipine (NORVASC) tablet 10 mg (10 mg Oral Given 10/22/17 0809)  divalproex (DEPAKOTE ER) 24 hr tablet 1,000 mg (1,000 mg Oral Given 10/21/17 2117)  gabapentin (NEURONTIN) capsule 300 mg (300 mg Oral Given 10/22/17 1727)  pantoprazole (PROTONIX) EC tablet 40 mg (40 mg Oral Given 10/22/17 0810)  atenolol (TENORMIN) tablet 50 mg (50 mg Oral Given 10/22/17 0810)  lamoTRIgine (LAMICTAL) tablet 25 mg (25 mg Oral Given 10/22/17 1726)  busPIRone (BUSPAR) tablet 15 mg (15 mg Oral Given 10/22/17 1727)  dicyclomine (BENTYL) capsule 10 mg (10 mg Oral Given 10/22/17 1726)  mirtazapine (REMERON) tablet 15 mg (15 mg Oral Given 10/21/17 2118)  FLUoxetine (PROZAC) capsule 40 mg (40 mg Oral Given 10/22/17 0809)  aspirin 81 MG chewable tablet (has no administration in time range)  nitroGLYCERIN (NITROSTAT) 0.4 MG SL tablet (has no administration in time range)  ibuprofen (ADVIL,MOTRIN) tablet 800 mg (has no administration in time range)  cyclobenzaprine (FLEXERIL) tablet 5 mg (has no administration in time range)  pneumococcal 23 valent vaccine (PNU-IMMUNE) injection 0.5 mL (0.5 mLs Intramuscular Given 10/16/17 1047)  mirtazapine (REMERON) 15 MG tablet (  Given  10/17/17 0024)     Initial Impression / Assessment and Plan / ED Course  I have reviewed the triage vital signs and the nursing notes.  Pertinent labs & imaging results that were available during my care of the patient were reviewed by me and considered in my medical decision making (see chart for details).    Gearold Wainer is a 47 y.o. male here with chest pain. L sided chest pain worse with palpation. Patient states that he had CAD and previous MI but had normal coronary catheterization a year ago and normal stress test. I think likely MSK pain. I have low suspicion for ACS or PE. Will get labs, CXR, trop x 2.   10:56 PM Labs unremarkable. Trop neg x 2. Pain improved with motrin, flexeril. Will add motrin and flexeril to his regimen. Stable to dc back to [facility].    Final Clinical Impressions(s) / ED Diagnoses   Final diagnoses:  None    ED Discharge Orders    None       Charlynne Pander, MD 10/22/17 2257

## 2017-10-22 NOTE — ED Triage Notes (Signed)
Pt arrived from [facility] with c/o CP w/SOB and nausea, some dizziness "not much". 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 Pt reports sudden onset of CP while watching TV at [facility] pain 10/10, given 324 ASA by MD at [facility] with 1 Nitro pain decreased to 8/10. Pt given another (total of 2) Nitro by EMS PTA.

## 2017-10-22 NOTE — ED Notes (Signed)
Patient transported to X-ray 

## 2017-10-22 NOTE — Progress Notes (Signed)
Patient reported chest pain on the left side radiating towards his shoulder. Patient was given aspirin 81 milligram as well as one nitro tab. Patient was assessed by Dr. Jama Flavors. Patient reported history of cardiac arrest one month ago. Patient was taken by EMS along with a mental health tech.

## 2017-10-22 NOTE — ED Notes (Signed)
[facility] called for transportation.  

## 2017-10-22 NOTE — Progress Notes (Signed)
Recreation Therapy Notes  Date: 7.10.19 Time: 0930 Location: 300 Hall Dayroom  Group Topic: Stress Management  Goal Area(s) Addresses:  Patient will verbalize importance of using healthy stress management.  Patient will identify positive emotions associated with healthy stress management.   Intervention: Stress Management  Activity :  Guided Imagery.  LRT introduced the stress management technique of guided imagery.  LRT read a script to allow patients to visualize being outside on a bright summer day.  Patients were to follow along as script was read.  Education:  Stress Management, Discharge Planning.   Education Outcome: Acknowledges edcuation/In group clarification offered/Needs additional education  Clinical Observations/Feedback: Pt did not attend group.    Caroll Rancher, LRT/CTRS         Caroll Rancher A 10/22/2017 11:48 AM

## 2017-10-22 NOTE — Therapy (Signed)
Occupational Therapy Group Treatment Note  Date:  10/22/2017 Time:  1:11 PM  Group Topic/Focus:  Stress Management  Participation Level:  Active  Participation Quality:  Appropriate  Affect:  Depressed and Flat  Cognitive:  Appropriate  Insight: Improving  Engagement in Group:  Engaged  Modes of Intervention:  Activity, Discussion, Education and Socialization  Additional Comments:    S: "I have lost a lot of relationships by the way I handle stress"  O: Stress management group completed to use as productive coping strategy, to help mitigate maladaptive coping to integrate in functional BADL/IADL when reintegrating into community. Stress management tools worksheet completed to identify negative coping mechanisms and their short and long term effects vs positive coping mechanisms with demonstration. Coping strategies taught include: relaxation based- deep breathing, counting to 10, taking a 1 minute vacation, acceptance, stress balls, relaxation audio/video, visual/mental imagery. Positive mental attitude- gratitude, acceptance, cognitive reframing, positive self talk, anger management. Adult coloring and relaxation tips worksheet given at end of session.   A: Pt presents to group with flat and depressed affect, engaged and participatory throughout entirety of session. Pt identified a majority of his current stress management as maladaptive which has negatively affected his personal relationships. Stress management tools worksheet completed. Pt reports he would like to practice gratitude journaling this date to reduce stress.  P: Pt provided with education on stress management activities to implement into daily routine. Handouts given to facilitate carryover when reintegrating into community      [facility], [place], OTR/L  [employer] 10/22/2017, 1:11 PM

## 2017-10-22 NOTE — BHH Group Notes (Signed)
LCSW Group Therapy Note 10/22/2017 3:16 PM  Type of Therapy and Topic: Group Therapy: Overcoming Obstacles  Participation Level: Active  Description of Group:  In this group patients will be encouraged to explore what they see as obstacles to their own wellness and recovery. They will be guided to discuss their thoughts, feelings, and behaviors related to these obstacles. The group will process together ways to cope with barriers, with attention given to specific choices patients can make. Each patient will be challenged to identify changes they are motivated to make in order to overcome their obstacles. This group will be process-oriented, with patients participating in exploration of their own experiences as well as giving and receiving support and challenge from other group members.  Therapeutic Goals: 1. Patient will identify personal and current obstacles as they relate to admission. 2. Patient will identify barriers that currently interfere with their wellness or overcoming obstacles.  3. Patient will identify feelings, thought process and behaviors related to these barriers. 4. Patient will identify two changes they are willing to make to overcome these obstacles:   Summary of Patient Progress Wensley was engaged and participated throughout the group session. Sahan reports that his main obstacle is his addiction to drugs. Tanor reports he does not know how he will overcome this obstacle due to being rejected by other substance abuse treatment facilities.     Therapeutic Modalities:  Cognitive Behavioral Therapy Solution Focused Therapy Motivational Interviewing Relapse Prevention Therapy   Alcario Drought Clinical Social Worker

## 2017-10-22 NOTE — ED Notes (Signed)
Pt arrives from [facility] with sitter in clothes. Per EDP pt can stay in clothes, wanded at [facility]. Environment secured.

## 2017-10-22 NOTE — Progress Notes (Signed)
B[facility] Group Notes:  (Nursing/MHT/Case Management/Adjunct)  Date:  10/22/2017  Time:  4:00 PM  Type of Therapy:  Group Therapy  Participation Level:  Active  Participation Quality:  Appropriate, Sharing and Supportive  Affect:  Appropriate and Excited  Cognitive:  Appropriate  Insight:  Appropriate and Improving  Engagement in Group:  Developing/Improving, Improving and Supportive  Modes of Intervention:  Activity and Socialization  Summary of Progress/Problems: Patient was very active in group therapy today. Patient is improving on insight and attitude.   Taji Sather 10/22/2017, 6:01 PM

## 2017-10-23 NOTE — B[facility] Group Notes (Signed)
LCSW Group Therapy Note  10/23/2017 1:15pm  Type of Therapy/Topic:  Group Therapy:  Feelings about Diagnosis  Participation Level:  Active   Description of Group:   This group will allow patients to explore their thoughts and feelings about diagnoses they have received. Patients will be guided to explore their level of understanding and acceptance of these diagnoses. Facilitator will encourage patients to process their thoughts and feelings about the reactions of others to their diagnosis and will guide patients in identifying ways to discuss their diagnosis with significant others in their lives. This group will be process-oriented, with patients participating in exploration of their own experiences, giving and receiving support, and processing challenge from other group members.   Therapeutic Goals: 1. Patient will demonstrate understanding of diagnosis as evidenced by identifying two or more symptoms of the disorder 2. Patient will be able to express two feelings regarding the diagnosis 3. Patient will demonstrate their ability to communicate their needs through discussion and/or role play       Therapeutic Modalities:   Cognitive Behavioral Therapy Brief Therapy Feelings Identification    Rona Ravens, LCSW 10/23/2017 11:19 AM

## 2017-10-23 NOTE — B[facility] Group Notes (Signed)
Adult Psychoeducational Group Note  Date:  10/23/2017  Time:  4:00 PM  Group Topic/Focus: Barriers to Recovery  Participation Level:  Poor  Participation Quality:  Minimal  Affect:  Inattentive  Cognitive:  Alert and Oriented  Insight: Limited/Lacking  Engagement in Group:  Developing/Improving  Modes of Intervention:  Discussion and Education  Additional Comments:  Patient was noted to fall asleep twice during group. Patient was supportive of his peers when he was awake but did not identify a barrier to his own recovery.  Marchelle Folks A Ammy Lienhard 10/23/2017 5:00 PM

## 2017-10-23 NOTE — B[facility] Group Notes (Signed)
Adult Psychoeducational Group Note  Date:  10/23/2017 Time:  11:09 PM  Group Topic/Focus:  Wrap-Up Group:   The focus of this group is to help patients review their daily goal of treatment and discuss progress on daily workbooks.  Participation Level:  Active  Participation Quality:  Appropriate  Affect:  Appropriate  Cognitive:  Appropriate  Insight: Appropriate  Engagement in Group:  Developing/Improving  Modes of Intervention:  Discussion  Additional Comments:  Patient shared that today was a good day. His plan was to work on himself and to work on discharge planning. Patient also wants to talk to the doctor.   Lyndee Hensen 10/23/2017, 11:09 PM

## 2017-10-23 NOTE — Progress Notes (Signed)
Patient approached Clinical research associate saying he was shaking, he has dry mouth, and he has a "rash" on his chest. Patient and writer established dry mouth is from med side effects and pt was encouraged to drink more water. Writer explained the only thing it could be is a reaction from the Flexeril given in the ED, but is unlikely because it's been in his system all day. This is the only medication change he's had. PRN Vistaril given. Support and encouragement given. Patient was seen unphazed walking down the hallway normally conversing with another patient two minutes after the complaint was made.

## 2017-10-23 NOTE — Progress Notes (Signed)
Patient self inventory- Patient slept well last night and sleep medication was not requested. Appetite has been fair, depression rated 9 out of 10, hopelessness rated 8 out of 10, and depression rated 8 out of 10 with 10 being the highest. Denies withdrawal symptoms. Patient complains of physical problems such as headaches and drowsiness. Patient's goal is to work on himself and he will do that by going to group.  Patient compliant with medications prescribed per MD. Patient safety maintained with 15 minute checks as well as environmental checks as well as safety checks. Will continue to monitor.

## 2017-10-23 NOTE — Progress Notes (Signed)
[facility] MD Progress Note  10/23/2017 11:58 AM Bradyn Soward  MRN:  [id number]  Subjective: Barry Daugherty reports, "I'm doing well. My mood is good now that I know I have a place to go to after discharge. The social worker told me that I be going to the [facility] homeless shelter after discharge".  History: This is one of several admission assessments for this 47 year old Caucasian male with hx of substance use disorder & Major depression. He is known on this unit from previous admissions all related to substance use disorder & mental illness. He is admitted to the [facility] this time from the [facility] ED with complaints of suicidal ideations with plans to overdose on medications. He is seeking mental health stabilization. During this assessment, Barry Daugherty reports, "I was at a store yesterday when I started to feel sick. I was dizzy, not feeling really well. Then, someone saw me & called the ambulance for me. I was also feeling suicidal with plans to overdose on pills. I have been depressed x 12 years. Life has been very difficult for me. I'm disabled. I have been on medications for my depression. The last time I took the pills was a week ago. When I was taking my medicines, I was feeling better. I need to get back on those medicines.   Argil is seen, chart reviewed. The chart findings discussed with the treatment. He presents alert, oriented x 4. His affect remains flat. He is visible on the unit, attending group sessions. He says today that he is still feeling bette because he has a place to go to after discharge. He says he is not suicidal or homicidal.  Denies any delusions or paranoia. He does not appear to be responding to any internal stimuli. He is encouraged to continue to attend & participate in the group sessions to learned coping skills. Discussed shelter options and plan to discharge on Thursday. He has agreed to continue current plan of care already in progress, Denies any adverse  effects.  Principal Problem: MDD (major depressive disorder), recurrent severe, without psychosis ([place])  Diagnosis:   Patient Active Problem List   Diagnosis Date Noted  . MDD (major depressive disorder), recurrent severe, without psychosis ([place]) [F33.2] 10/15/2017    Priority: High  . MDD (major depressive disorder) [F32.9] 04/12/2015  . Substance induced mood disorder ([place]) [F19.94] 04/04/2015  . Severe episode of recurrent major depressive disorder, without psychotic features ([place]) [F33.2]   . GAD (generalized anxiety disorder) [F41.1]    Total Time spent with patient: 15 minutes  Past Psychiatric History: See H&P  Past Medical History:  Past Medical History:  Diagnosis Date  . Anxiety   . Bipolar 1 disorder ([place])   . Brain injury ([place])   . Depression   . Diabetes mellitus without complication ([place])   . Hypertension   . Seizures ([place])     Past Surgical History:  Procedure Laterality Date  . CHOLECYSTECTOMY    . NEPHRECTOMY     Family History: History reviewed. No pertinent family history.  Family Psychiatric  History: See H&P  Social History:  Social History   Substance and Sexual Activity  Alcohol Use No   Comment: [facility]     Social History   Substance and Sexual Activity  Drug Use Yes  . Types: Cocaine, Marijuana   Comment: last used 2 weeks ago     Social History   Socioeconomic History  . Marital status: Single    Spouse  name: Not on file  . Number of children: Not on file  . Years of education: Not on file  . Highest education level: Not on file  Occupational History  . Not on file  Social Needs  . Financial resource strain: Not on file  . Food insecurity:    Worry: Not on file    Inability: Not on file  . Transportation needs:    Medical: Not on file    Non-medical: Not on file  Tobacco Use  . Smoking status: Former Smoker    Types: Cigarettes  . Smokeless tobacco: Never Used  Substance and Sexual Activity  . Alcohol use: No     Comment: [facility]  . Drug use: Yes    Types: Cocaine, Marijuana    Comment: last used 2 weeks ago   . Sexual activity: Not Currently  Lifestyle  . Physical activity:    Days per week: Not on file    Minutes per session: Not on file  . Stress: Not on file  Relationships  . Social connections:    Talks on phone: Not on file    Gets together: Not on file    Attends religious service: Not on file    Active member of club or organization: Not on file    Attends meetings of clubs or organizations: Not on file    Relationship status: Not on file  Other Topics Concern  . Not on file  Social History Narrative  . Not on file   Additional Social History:   Sleep: Good  Appetite:  Good  Current Medications: Current Facility-Administered Medications  Medication Dose Route Frequency Provider Last Rate Last Dose  . albuterol (PROVENTIL HFA;VENTOLIN HFA) 108 (90 Base) MCG/ACT inhaler 1-2 puff  1-2 puff Inhalation Q6H PRN Tea Collums I, NP      . alum & mag hydroxide-simeth (MAALOX/MYLANTA) 200-200-20 MG/5ML suspension 30 mL  30 mL Oral Q4H PRN Nanci Pina, FNP   30 mL at 10/19/17 2141  . amLODipine (NORVASC) tablet 10 mg  10 mg Oral Daily Lindell Spar I, NP   10 mg at 10/23/17 0817  . atenolol (TENORMIN) tablet 50 mg  50 mg Oral Daily Lindell Spar I, NP   50 mg at 10/23/17 0817  . busPIRone (BUSPAR) tablet 15 mg  15 mg Oral BID Lindell Spar I, NP   15 mg at 10/23/17 0817  . cyclobenzaprine (FLEXERIL) tablet 5 mg  5 mg Oral TID PRN Drenda Freeze, MD      . dicyclomine (BENTYL) capsule 10 mg  10 mg Oral TID AC & HS Derrill Center, NP   10 mg at 10/23/17 0625  . divalproex (DEPAKOTE ER) 24 hr tablet 1,000 mg  1,000 mg Oral QHS Isobella Ascher I, NP   1,000 mg at 10/22/17 2334  . FLUoxetine (PROZAC) capsule 40 mg  40 mg Oral Daily Lavella Hammock, MD   40 mg at 10/23/17 0817  . gabapentin (NEURONTIN) capsule 300 mg  300 mg Oral TID WC & HS Meeya Goldin I, NP   300 mg at  10/23/17 0817  . hydrOXYzine (ATARAX/VISTARIL) tablet 25 mg  25 mg Oral TID PRN Nanci Pina, FNP   25 mg at 10/20/17 2128  . ibuprofen (ADVIL,MOTRIN) tablet 600 mg  600 mg Oral Q6H PRN Lindon Romp A, NP   600 mg at 10/20/17 2128  . lamoTRIgine (LAMICTAL) tablet 25 mg  25 mg Oral BID Encarnacion Slates, NP  25 mg at 10/23/17 0817  . magnesium hydroxide (MILK OF MAGNESIA) suspension 30 mL  30 mL Oral Daily PRN Starkes, Takia S, FNP      . mirtazapine (REMERON) tablet 15 mg  15 mg Oral QHS Derrill Center, NP   15 mg at 10/22/17 2335  . pantoprazole (PROTONIX) EC tablet 40 mg  40 mg Oral Daily Lindell Spar I, NP   40 mg at 10/23/17 5009   Lab Results:  Results for orders placed or performed during the hospital encounter of 10/15/17 (from the past 48 hour(s))  Basic metabolic panel     Status: Abnormal   Collection Time: 10/22/17  7:23 PM  Result Value Ref Range   Sodium 142 135 - 145 mmol/L   Potassium 4.3 3.5 - 5.1 mmol/L   Chloride 107 98 - 111 mmol/L    Comment: Please note change in reference range.   CO2 25 22 - 32 mmol/L   Glucose, Bld 150 (H) 70 - 99 mg/dL    Comment: Please note change in reference range.   BUN 15 6 - 20 mg/dL    Comment: Please note change in reference range.   Creatinine, Ser 1.51 (H) 0.61 - 1.24 mg/dL   Calcium 8.7 (L) 8.9 - 10.3 mg/dL   GFR calc non Af Amer 55 (L) >60 mL/min   GFR calc Af Amer >60 >60 mL/min    Comment: (NOTE) The eGFR has been calculated using the CKD EPI equation. This calculation has not been validated in all clinical situations. eGFR's persistently <60 mL/min signify possible Chronic Kidney Disease.    Anion gap 10 5 - 15    Comment: Performed at [facility] [address], [place] [address]  CBC     Status: None   Collection Time: 10/22/17  7:23 PM  Result Value Ref Range   WBC 7.7 4.0 - 10.5 K/uL   RBC 4.43 4.22 - 5.81 MIL/uL   Hemoglobin 13.2 13.0 - 17.0 g/dL   HCT 40.4 39.0 - 52.0 %   MCV 91.2 78.0 - 100.0  fL   MCH 29.8 26.0 - 34.0 pg   MCHC 32.7 30.0 - 36.0 g/dL   RDW 13.9 11.5 - 15.5 %   Platelets 330 150 - 400 K/uL    Comment: Performed at [facility] [address] [place], [place] [address]  I-stat troponin, ED     Status: None   Collection Time: 10/22/17  7:29 PM  Result Value Ref Range   Troponin i, poc 0.00 0.00 - 0.08 ng/mL   Comment 3            Comment: Due to the release kinetics of cTnI, a negative result within the first hours of the onset of symptoms does not rule out myocardial infarction with certainty. If myocardial infarction is still suspected, repeat the test at appropriate intervals.   I-stat troponin, ED     Status: None   Collection Time: 10/22/17  9:53 PM  Result Value Ref Range   Troponin i, poc 0.00 0.00 - 0.08 ng/mL   Comment 3            Comment: Due to the release kinetics of cTnI, a negative result within the first hours of the onset of symptoms does not rule out myocardial infarction with certainty. If myocardial infarction is still suspected, repeat the test at appropriate intervals.    Blood Alcohol level:  Lab Results  Component Value  Date   ETH <10 10/15/2017   ETH <5 16/01/9603   Metabolic Disorder Labs: Lab Results  Component Value Date   HGBA1C 5.1 10/16/2017   MPG 99.67 10/16/2017   No results found for: PROLACTIN Lab Results  Component Value Date   CHOL 182 10/16/2017   TRIG 260 (H) 10/16/2017   HDL 28 (L) 10/16/2017   CHOLHDL 6.5 10/16/2017   VLDL 52 (H) 10/16/2017   LDLCALC 102 (H) 10/16/2017   Physical Findings: AIMS: Facial and Oral Movements Muscles of Facial Expression: None, normal Lips and Perioral Area: None, normal Jaw: None, normal Tongue: None, normal,Extremity Movements Upper (arms, wrists, hands, fingers): None, normal Lower (legs, knees, ankles, toes): None, normal, Trunk Movements Neck, shoulders, hips: None, normal, Overall Severity Severity of abnormal movements (highest score from  questions above): None, normal Incapacitation due to abnormal movements: None, normal Patient's awareness of abnormal movements (rate only patient's report): No Awareness, Dental Status Current problems with teeth and/or dentures?: No Does patient usually wear dentures?: No  CIWA:    COWS:     Musculoskeletal: Strength & Muscle Tone: within normal limits Gait & Station: normal Patient leans: N/A  Psychiatric Specialty Exam: Physical Exam  Nursing note and vitals reviewed. Constitutional: He appears well-developed.  Psychiatric:  depresed    Review of Systems  Psychiatric/Behavioral: Positive for depression, substance abuse (Hx. Cocaine use disorder) and suicidal ideas (passive/fleeting thoughts). Negative for hallucinations and memory loss. The patient is nervous/anxious. The patient does not have insomnia.     Blood pressure (!) 139/96, pulse 70, temperature 97.8 F (36.6 C), resp. rate 18, height 5' 9"  (1.753 m), weight 99.8 kg (220 lb), SpO2 93 %.Body mass index is 32.49 kg/m.  General Appearance: Casual and Fairly Groomed  Eye Contact:  Good  Speech:  Clear and Coherent and Normal Rate  Volume:  Normal  Mood:  Depressed  Affect:  Non-Congruent  Thought Process:  Coherent, Linear and Descriptions of Associations: Intact  Orientation:  Full (Time, Place, and Person)  Thought Content:  Hallucinations: Auditory  Suicidal Thoughts:  Yes.  without intent/plan  Homicidal Thoughts:  Denies  Memory:  Immediate;   Good Recent;   Good Remote;   Good  Judgement:  Good  Insight:  Good  Psychomotor Activity:  Normal  Concentration:  Concentration: Good and Attention Span: Good  Recall:  Good  Fund of Knowledge:  Good  Language:  Good  Akathisia:  Negative  Handed:  Right  AIMS (if indicated):     Assets:  Communication Skills Desire for Improvement  ADL's:  Intact  Cognition:  WNL  Sleep:  Number of Hours: 6.25     Treatment Plan Summary: Daily contact with patient to  assess and evaluate symptoms and progress in treatment.  - Continue inpatient hospitalization.  - Will continue today 10/23/2017 plan as below except where it is noted.  Depression.       - Continue Prozac 40 mg po daily on 10/21/2017       - Continue Lamictal 25 mg po daily for mood stabilization.  Anxiety.       - Continue Buspar 15 mg po bid.       - Continue Hydroxyzine 25 mg po Q 8 hours prn.  Agitation.       - Continue Gabapentin 300 mg po tid.  Insomnia.      - Continue Mirtazapine 7.5 mg po Q hs.  Other medical issues:   GERD: Continue Protonix 40 mg po  daily in am.   Seizure disorder: Continue Depakote ER 1,000 mg po bid. (Obtain Depakote level on 10-20-17 am- reviewed, result at 43).    HTN: Continue amlodipine 10 mg po daily.              Continue atenolol 50 mg po daily.  - Patient to continue to attend group sessions.  - Discharge disposition plan ongoing.  Lindell Spar, NP, PMHNP, FNP-BC 10/23/2017, 11:58 AMPatient ID: Barry Daugherty, male   DOB: August 03, 1970, 47 y.o.   MRN: [id number]

## 2017-10-23 NOTE — Plan of Care (Signed)
  Problem: Education: Goal: Knowledge of [facility] Education information/materials will improve Outcome: Progressing Goal: Emotional status will improve Outcome: Progressing Goal: Mental status will improve Outcome: Progressing Goal: Verbalization of understanding the information provided will improve Outcome: Progressing   Problem: Activity: Goal: Interest or engagement in activities will improve Outcome: Progressing Goal: Sleeping patterns will improve Outcome: Progressing   Problem: Coping: Goal: Ability to verbalize frustrations and anger appropriately will improve Outcome: Progressing Goal: Ability to demonstrate self-control will improve Outcome: Progressing   Problem: Health Behavior/Discharge Planning: Goal: Identification of resources available to assist in meeting health care needs will improve Outcome: Progressing Goal: Compliance with treatment plan for underlying cause of condition will improve Outcome: Progressing   Problem: Physical Regulation: Goal: Ability to maintain clinical measurements within normal limits will improve Outcome: Progressing   Problem: Safety: Goal: Periods of time without injury will increase Outcome: Progressing   Problem: Education: Goal: Utilization of techniques to improve thought processes will improve Outcome: Progressing Goal: Knowledge of the prescribed therapeutic regimen will improve Outcome: Progressing   Problem: Activity: Goal: Interest or engagement in leisure activities will improve Outcome: Progressing Goal: Imbalance in normal sleep/wake cycle will improve Outcome: Progressing   Problem: Coping: Goal: Coping ability will improve Outcome: Progressing Goal: Will verbalize feelings Outcome: Progressing   Problem: Health Behavior/Discharge Planning: Goal: Ability to make decisions will improve Outcome: Progressing Goal: Compliance with therapeutic regimen will improve Outcome: Progressing    Problem: Role Relationship: Goal: Will demonstrate positive changes in social behaviors and relationships Outcome: Progressing   Problem: Safety: Goal: Ability to disclose and discuss suicidal ideas will improve Outcome: Progressing Goal: Ability to identify and utilize support systems that promote safety will improve Outcome: Progressing   Problem: Self-Concept: Goal: Will verbalize positive feelings about self Outcome: Progressing Goal: Level of anxiety will decrease Outcome: Progressing   Problem: Education: Goal: Ability to make informed decisions regarding treatment will improve Outcome: Progressing   Problem: Coping: Goal: Coping ability will improve Outcome: Progressing   Problem: Health Behavior/Discharge Planning: Goal: Identification of resources available to assist in meeting health care needs will improve Outcome: Progressing   Problem: Medication: Goal: Compliance with prescribed medication regimen will improve Outcome: Progressing   Problem: Self-Concept: Goal: Ability to disclose and discuss suicidal ideas will improve Outcome: Progressing Goal: Will verbalize positive feelings about self Outcome: Progressing   Problem: Education: Goal: Knowledge of disease or condition will improve Outcome: Progressing Goal: Understanding of discharge needs will improve Outcome: Progressing   Problem: Health Behavior/Discharge Planning: Goal: Ability to identify changes in lifestyle to reduce recurrence of condition will improve Outcome: Progressing Goal: Identification of resources available to assist in meeting health care needs will improve Outcome: Progressing   Problem: Physical Regulation: Goal: Complications related to the disease process, condition or treatment will be avoided or minimized Outcome: Progressing   Problem: Safety: Goal: Ability to remain free from injury will improve Outcome: Progressing   

## 2017-10-24 NOTE — Progress Notes (Signed)
Pt came to the NS early in the shift stating that he had a rash, and showed writer some red areas around the hair line on his forehead.  He also stated he is supposed to discharge tomorrow and is going to [facility].  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  There are no immediate orders for discharge at this time.  He contracts for [employer].  He denies HI/AVH.  He says he does not have any family or friend support.  He appears anxious and worried as he talks to Clinical research associate.  Pt had been given Vistaril just before the beginning of the evening shift.  Writer told pt that the Vistaril would help and if there were no other symptoms tonight, to show the areas to the doctor in the morning.  Support and encouragement offered.  Discharge plans are in process.  Safety maintained with q15 minute checks.

## 2017-10-24 NOTE — [facility] Group Notes (Signed)
LCSW Group Therapy Note 10/24/2017 3:28 PM  Type of Therapy and Topic: Group Therapy: Avoiding Self-Sabotaging and Enabling Behaviors  Participation Level: Did Not Attend  Description of Group:  In this group, patients will learn how to identify obstacles, self-sabotaging and enabling behaviors, as well as: what are they, why do we do them and what needs these behaviors meet. Discuss unhealthy relationships and how to have positive healthy boundaries with those that sabotage and enable. Explore aspects of self-sabotage and enabling in yourself and how to limit these self-destructive behaviors in everyday life.  Therapeutic Goals: 1. Patient will identify one obstacle that relates to self-sabotage and enabling behaviors 2. Patient will identify one personal self-sabotaging or enabling behavior they did prior to admission 3. Patient will state a plan to change the above identified behavior 4. Patient will demonstrate ability to communicate their needs through discussion and/or role play.   Summary of Patient Progress:  Invited, chose not to attend.     Therapeutic Modalities:  Cognitive Behavioral Therapy Person-Centered Therapy Motivational Interviewing   Baldo Daub LCSWA Clinical Social Worker

## 2017-10-24 NOTE — Progress Notes (Signed)
D: Pt was in the dayroom upon initial approach.  Pt presents with depressed affect and mood.  He reports he has "been sleeping all day."  Pt states "I'm feeling depressed, I miss my family."  Pt denies SI/HI, denies hallucinations.  Pt has been visible in milieu interacting with peers and staff appropriately.  Pt attended evening group.    A: Introduced self to pt.  Actively listened to pt and provided support and encouragement.  Positive coping skills encouraged.  Medications administered per order.  PRN medication administered for muscle spasms.  Q15 minute safety checks maintained.  R: Pt is safe on the unit.  Pt is compliant with medications.  Pt verbally contracts for safety.  Will continue to monitor and assess.

## 2017-10-24 NOTE — Progress Notes (Signed)
Recreation Therapy Notes  Date: 7.12.19 Time: 0930 Location: 300 Hall Dayroom  Group Topic: Stress Management  Goal Area(s) Addresses:  Patient will verbalize importance of using healthy stress management.  Patient will identify positive emotions associated with healthy stress management.   Intervention: Stress Management  Activity :  Meditation.  LRT introduced the stress management technique of meditation.  LRT played a meditation on being resilient.  Patients were to listen to the meditation and follow along as meditation played.  Education:  Stress Management, Discharge Planning.   Education Outcome: Acknowledges edcuation/In group clarification offered/Needs additional education  Clinical Observations/Feedback: Pt did not attend group.    Caroll Rancher, LRT/CTRS         Caroll Rancher A 10/24/2017 11:49 AM

## 2017-10-24 NOTE — Tx Team (Addendum)
Interdisciplinary Treatment and Diagnostic Plan Update  10/24/2017 Time of Session: [id number] Barry Daugherty MRN: [id number]  Principal Diagnosis: MDD (major depressive disorder), recurrent severe, without psychosis (HCC)  Secondary Diagnoses: Principal Problem:   MDD (major depressive disorder), recurrent severe, without psychosis (HCC)   Current Medications:  Current Facility-Administered Medications  Medication Dose Route Frequency Provider Last Rate Last Dose  . albuterol (PROVENTIL HFA;VENTOLIN HFA) 108 (90 Base) MCG/ACT inhaler 1-2 puff  1-2 puff Inhalation Q6H PRN Nwoko, Agnes I, NP      . alum & mag hydroxide-simeth (MAALOX/MYLANTA) 200-200-20 MG/5ML suspension 30 mL  30 mL Oral Q4H PRN Truman Hayward, FNP   30 mL at 10/19/17 2141  . amLODipine (NORVASC) tablet 10 mg  10 mg Oral Daily Armandina Stammer I, NP   10 mg at 10/23/17 0817  . atenolol (TENORMIN) tablet 50 mg  50 mg Oral Daily Armandina Stammer I, NP   50 mg at 10/23/17 0817  . busPIRone (BUSPAR) tablet 15 mg  15 mg Oral BID Armandina Stammer I, NP   15 mg at 10/23/17 1810  . cyclobenzaprine (FLEXERIL) tablet 5 mg  5 mg Oral TID PRN Charlynne Pander, MD   5 mg at 10/23/17 2114  . dicyclomine (BENTYL) capsule 10 mg  10 mg Oral TID AC & HS Oneta Rack, NP   10 mg at 10/24/17 2130  . divalproex (DEPAKOTE ER) 24 hr tablet 1,000 mg  1,000 mg Oral QHS Nwoko, Agnes I, NP   1,000 mg at 10/23/17 2113  . FLUoxetine (PROZAC) capsule 40 mg  40 mg Oral Daily Mariel Craft, MD   40 mg at 10/23/17 0817  . gabapentin (NEURONTIN) capsule 300 mg  300 mg Oral TID WC & HS Nwoko, Agnes I, NP   300 mg at 10/23/17 2113  . hydrOXYzine (ATARAX/VISTARIL) tablet 25 mg  25 mg Oral TID PRN Truman Hayward, FNP   25 mg at 10/23/17 1813  . ibuprofen (ADVIL,MOTRIN) tablet 600 mg  600 mg Oral Q6H PRN Nira Conn A, NP   600 mg at 10/23/17 2114  . lamoTRIgine (LAMICTAL) tablet 25 mg  25 mg Oral BID Armandina Stammer I, NP   25 mg at 10/23/17 1811  . magnesium  hydroxide (MILK OF MAGNESIA) suspension 30 mL  30 mL Oral Daily PRN Starkes, Takia S, FNP      . mirtazapine (REMERON) tablet 15 mg  15 mg Oral QHS Oneta Rack, NP   15 mg at 10/23/17 2113  . pantoprazole (PROTONIX) EC tablet 40 mg  40 mg Oral Daily Armandina Stammer I, NP   40 mg at 10/23/17 8657   PTA Medications: Medications Prior to Admission  Medication Sig Dispense Refill Last Dose  . albuterol (PROVENTIL HFA;VENTOLIN HFA) 108 (90 Base) MCG/ACT inhaler Inhale 1-2 puffs into the lungs every 6 (six) hours as needed for wheezing or shortness of breath.   > month  . amitriptyline (ELAVIL) 150 MG tablet Take 150 mg by mouth at bedtime.   Past Week at Unknown time  . amLODipine (NORVASC) 10 MG tablet Take 1 tablet (10 mg total) by mouth daily. 14 tablet 0 Past Week at Unknown time  . atenolol (TENORMIN) 50 MG tablet Take 1 tablet (50 mg total) by mouth daily. 14 tablet 0 Past Week at 8:00am  . busPIRone (BUSPAR) 30 MG tablet Take 30 mg by mouth 3 (three) times daily.   Past Week at Unknown time  . divalproex (DEPAKOTE ER) 500  MG 24 hr tablet Take 1,000 mg by mouth at bedtime.   Past Week at Unknown time  . FLUoxetine (PROZAC) 20 MG capsule Take 1 capsule (20 mg total) by mouth daily. 30 capsule 0 Past Week at Unknown time  . folic acid (FOLVITE) 1 MG tablet Take 1 mg by mouth daily.   05/21/2016 at Unknown time  . gabapentin (NEURONTIN) 300 MG capsule Take 1 capsule (300 mg total) by mouth 4 (four) times daily -  with meals and at bedtime. 120 capsule 0 Past Week at Unknown time  . ibuprofen (ADVIL,MOTRIN) 800 MG tablet Take 1 tablet (800 mg total) by mouth 3 (three) times daily. 21 tablet 0 > month  . lamoTRIgine (LAMICTAL) 25 MG tablet Take 1 tablet (25 mg total) by mouth 2 (two) times daily. 60 tablet 0 Past Week at Unknown time  . mirtazapine (REMERON) 7.5 MG tablet Take 7.5 mg by mouth at bedtime.   Past Week at Unknown time  . Multiple Vitamin (MULTIVITAMIN WITH MINERALS) TABS tablet Take 1  tablet by mouth daily.   Past Week at Unknown time  . pantoprazole (PROTONIX) 40 MG tablet Take 1 tablet (40 mg total) by mouth daily.   Past Week at Unknown time  . thiamine (VITAMIN B-1) 100 MG tablet Take 100 mg by mouth daily.   > month  . traZODone (DESYREL) 100 MG tablet Take 1 tablet (100 mg total) by mouth at bedtime as needed for sleep. 30 tablet 0 Past Week at Unknown time    Patient Stressors: Marital or family conflict Medication change or noncompliance Substance abuse  Patient Strengths: Ability for insight Average or above average intelligence Capable of independent living General fund of knowledge  Treatment Modalities: Medication Management, Group therapy, Case management,  1 to 1 session with clinician, Psychoeducation, Recreational therapy.   Physician Treatment Plan for Primary Diagnosis: MDD (major depressive disorder), recurrent severe, without psychosis (HCC) Long Term Goal(s): Improvement in symptoms so as ready for discharge Improvement in symptoms so as ready for discharge   Short Term Goals: Ability to identify changes in lifestyle to reduce recurrence of condition will improve Ability to demonstrate self-control will improve Ability to identify and develop effective coping behaviors will improve Compliance with prescribed medications will improve Ability to identify triggers associated with substance abuse/mental health issues will improve  Medication Management: Evaluate patient's response, side effects, and tolerance of medication regimen.  Therapeutic Interventions: 1 to 1 sessions, Unit Group sessions and Medication administration.  Evaluation of Outcomes: Progressing  Physician Treatment Plan for Secondary Diagnosis: Principal Problem:   MDD (major depressive disorder), recurrent severe, without psychosis (HCC)  Long Term Goal(s): Improvement in symptoms so as ready for discharge Improvement in symptoms so as ready for discharge   Short Term  Goals: Ability to identify changes in lifestyle to reduce recurrence of condition will improve Ability to demonstrate self-control will improve Ability to identify and develop effective coping behaviors will improve Compliance with prescribed medications will improve Ability to identify triggers associated with substance abuse/mental health issues will improve     Medication Management: Evaluate patient's response, side effects, and tolerance of medication regimen.  Therapeutic Interventions: 1 to 1 sessions, Unit Group sessions and Medication administration.  Evaluation of Outcomes: Progressing   RN Treatment Plan for Primary Diagnosis: MDD (major depressive disorder), recurrent severe, without psychosis (HCC) Long Term Goal(s): Knowledge of disease and therapeutic regimen to maintain health will improve  Short Term Goals: Ability to remain free from  injury will improve, Ability to verbalize feelings will improve and Ability to identify and develop effective coping behaviors will improve  Medication Management: RN will administer medications as ordered by provider, will assess and evaluate patient's response and provide education to patient for prescribed medication. RN will report any adverse and/or side effects to prescribing provider.  Therapeutic Interventions: 1 on 1 counseling sessions, Psychoeducation, Medication administration, Evaluate responses to treatment, Monitor vital signs and CBGs as ordered, Perform/monitor CIWA, COWS, AIMS and Fall Risk screenings as ordered, Perform wound care treatments as ordered.  Evaluation of Outcomes: Progressing   LCSW Treatment Plan for Primary Diagnosis: MDD (major depressive disorder), recurrent severe, without psychosis (HCC) Long Term Goal(s): Safe transition to appropriate next level of care at discharge, Engage patient in therapeutic group addressing interpersonal concerns.  Short Term Goals: Engage patient in aftercare planning with  referrals and resources, Facilitate patient progression through stages of change regarding substance use diagnoses and concerns and Identify triggers associated with mental health/substance abuse issues  Therapeutic Interventions: Assess for all discharge needs, 1 to 1 time with Social worker, Explore available resources and support systems, Assess for adequacy in community support network, Educate family and significant other(s) on suicide prevention, Complete Psychosocial Assessment, Interpersonal group therapy.  Evaluation of Outcomes: Progressing   Progress in Treatment: Attending groups: Yes. Participating in groups: Yes. Taking medication as prescribed: Yes. Toleration medication: Yes. Family/Significant other contact made: SPE completed with pt; pt declined to consent to collateral contact.  Patient understands diagnosis: Yes. Discussing patient identified problems/goals with staff: Yes. Medical problems stabilized or resolved: Yes. Denies suicidal/homicidal ideation: Yes. Issues/concerns per patient self-inventory: No. Other: n/a   New problem(s) identified: No, Describe:  n/a  New Short Term/Long Term Goal(s): detox, medication management for mood stabilization; elimination of SI thoughts; development of comprehensive mental wellness/sobriety plan.   Patient Goals:  "To get help with my depression and thoughts to hurt myself."   Discharge Plan or Barriers: Pt has [facility] on Tuesday, 10/28/17.  Pt reports he has been homeless for 22 years. He does not meet criteria for inpatient treatment and has no income for [facility]way house. Pt has no current outpatient provider. CSW exploring all tangible options for pt.   Reason for Continuation of Hospitalization: Anxiety Depression Medication stabilization Suicidal ideation  Estimated Length of Stay: Monday, 10/27/17  Attendees: Patient:  10/24/2017 9:16 AM  Physician: Dr. Viviano Simas MD; Dr. Jama Flavors MD 10/24/2017 9:16 AM   Nursing: Marchelle Folks RN; Patty RN 10/24/2017 9:16 AM  RN Care Manager:x 10/24/2017 9:16 AM  Social Worker: Corrie Mckusick LCSW 10/24/2017 9:16 AM  Recreational Therapist: x 10/24/2017 9:16 AM  Other: Armandina Stammer NP 10/24/2017 9:16 AM  Other:  10/24/2017 9:16 AM  Other: 10/24/2017 9:16 AM    Scribe for Treatment Team: Rona Ravens, LCSW 10/24/2017 9:16 AM

## 2017-10-24 NOTE — Progress Notes (Signed)
D pT IS SEEN oob ual on the 400 hall today. He tolerates this well. He is quiet. HE endorses a flat, blunted affect and he tells this writer " I stay in my room most of the time".     A Pt completed his daily assessment today. HE writes on this he denies SI today and he rated his depression, hopelessness and anxiety" 5/5/5"respectively. He requests to speak to the MD tomorrow about his itchy scalp.    R Safety is in place.l

## 2017-10-24 NOTE — Progress Notes (Signed)
Patient did attend the evening speaker AA meeting.  

## 2017-10-24 NOTE — Plan of Care (Signed)
  Problem: Education: Goal: Emotional status will improve Outcome: Progressing   

## 2017-10-25 NOTE — Progress Notes (Signed)
D Pt is observed [facility] on the 300 hall today. HE is sleepy. Unshaven. Poor hygiene. Refuses to bathes and / or shave and / or change his scrubs.     A He completed his daily assessment and on it she wrote she deneid SI and she rated her depression, hopelessness and anxiety "9/9/9/", respectively. He says " I feel awful". " I don't know what's wrong with me". Pt slept off and on through the day. He endorses a flat, depressed affect and he said over and over " w2hen will I get better?"     R Safety is in place.

## 2017-10-25 NOTE — Progress Notes (Addendum)
[facility] MD Progress Note  10/25/2017 4:09 PM Barry Daugherty  MRN:  [id number]  Subjective: Barry Daugherty reports, "I'm doing well. Feeling a lot better. Im tired though. Im not suicidal anymore. I feel a lot better about myself. "  History: This is one of several admission assessments for this 47 year old Caucasian male with hx of substance use disorder & Major depression. He is known on this unit from previous admissions all related to substance use disorder & mental illness. He is admitted to the [facility] this time from the [facility] ED with complaints of suicidal ideations with plans to overdose on medications. He is seeking mental health stabilization. During this assessment, [name] reports, "I was at a store yesterday when I started to feel sick. I was dizzy, not feeling really well. Then, someone saw me & called the ambulance for me. I was also feeling suicidal with plans to overdose on pills. I have been depressed x 12 years. Life has been very difficult for me. I'm disabled. I have been on medications for my depression. The last time I took the pills was a week ago. When I was taking my medicines, I was feeling better. I need to get back on those medicines.   [name] is seen, chart reviewed. The chart findings discussed with the treatment. He presents alert, oriented x 4. His affect remains flat. He is visible on the unit, attending group sessions. He says he is not suicidal or homicidal.  Denies any delusions or paranoia. He does not appear to be responding to any internal stimuli. He is encouraged to continue to attend & participate in the group sessions to learned coping skills.He plans to attend [facility] inpatient program, on Tuesday for screening. He has agreed to continue current plan of care already in progress, Denies any adverse effects.He has completed his detox and is mentally stable at this time to discharge. Discussed use of hospital and secondary gain for housing is inappropriate, and will  need to prepare for discharge. He states that his screening for [facility] is on Tuesday, he is advised that he has been inpatient for 10 days, and no longer meets inpatient criteria will need to prepare for discharge.   Principal Problem: MDD (major depressive disorder), recurrent severe, without psychosis (HCC)  Diagnosis:   Patient Active Problem List   Diagnosis Date Noted  . MDD (major depressive disorder), recurrent severe, without psychosis (HCC) [F33.2] 10/15/2017  . MDD (major depressive disorder) [F32.9] 04/12/2015  . 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 Severe episode of recurrent major depressive disorder, without psychotic features (HCC) [F33.2]   . GAD (generalized anxiety disorder) [F41.1]    Total Time spent with patient: 15 minutes  Past Psychiatric History: See H&P  Past Medical History:  Past Medical History:  Diagnosis Date  . Anxiety   . Bipolar 1 disorder (HCC)   . Brain injury (HCC)   . Depression   . Diabetes mellitus without complication (HCC)   . Hypertension   . Seizures (HCC)     Past Surgical History:  Procedure Laterality Date  . CHOLECYSTECTOMY    . NEPHRECTOMY     Family History: History reviewed. No pertinent family history.  Family Psychiatric  History: See H&P  Social History:  Social History   Substance and Sexual Activity  Alcohol Use No   Comment: [facility]     Social History   Substance and Sexual Activity  Drug Use Yes  . Types: Cocaine, Marijuana  Comment: last used 2 weeks ago     Social History   Socioeconomic History  . Marital status: Single    Spouse name: Not on file  . Number of children: Not on file  . Years of education: Not on file  . Highest education level: Not on file  Occupational History  . Not on file  Social Needs  . Financial resource strain: Not on file  . Food insecurity:    Worry: Not on file    Inability: Not on file  . Transportation needs:    Medical: Not on  file    Non-medical: Not on file  Tobacco Use  . Smoking status: Former Smoker    Types: Cigarettes  . Smokeless tobacco: Never Used  Substance and Sexual Activity  . Alcohol use: No    Comment: [facility]  . Drug use: Yes    Types: Cocaine, Marijuana    Comment: last used 2 weeks ago   . Sexual activity: Not Currently  Lifestyle  . Physical activity:    Days per week: Not on file    Minutes per session: Not on file  . Stress: Not on file  Relationships  . Social connections:    Talks on phone: Not on file    Gets together: Not on file    Attends religious service: Not on file    Active member of club or organization: Not on file    Attends meetings of clubs or organizations: Not on file    Relationship status: Not on file  Other Topics Concern  . Not on file  Social History Narrative  . Not on file   Additional Social History:   Sleep: Good  Appetite:  Good  Current Medications: Current Facility-Administered Medications  Medication Dose Route Frequency Provider Last Rate Last Dose  . albuterol (PROVENTIL HFA;VENTOLIN HFA) 108 (90 Base) MCG/ACT inhaler 1-2 puff  1-2 puff Inhalation Q6H PRN Nwoko, Agnes I, NP      . alum & mag hydroxide-simeth (MAALOX/MYLANTA) 200-200-20 MG/5ML suspension 30 mL  30 mL Oral Q4H PRN Truman Hayward, FNP   30 mL at 10/19/17 2141  . amLODipine (NORVASC) tablet 10 mg  10 mg Oral Daily Armandina Stammer I, NP   10 mg at 10/25/17 0852  . atenolol (TENORMIN) tablet 50 mg  50 mg Oral Daily Armandina Stammer I, NP   50 mg at 10/25/17 0852  . busPIRone (BUSPAR) tablet 15 mg  15 mg Oral BID Armandina Stammer I, NP   15 mg at 10/25/17 0853  . cyclobenzaprine (FLEXERIL) tablet 5 mg  5 mg Oral TID PRN Charlynne Pander, MD   5 mg at 10/24/17 2120  . dicyclomine (BENTYL) capsule 10 mg  10 mg Oral TID AC & HS Oneta Rack, NP   10 mg at 10/25/17 1259  . divalproex (DEPAKOTE ER) 24 hr tablet 1,000 mg  1,000 mg Oral QHS Nwoko, Agnes I, NP   1,000 mg at 10/24/17  2119  . FLUoxetine (PROZAC) capsule 40 mg  40 mg Oral Daily Mariel Craft, MD   40 mg at 10/25/17 0852  . gabapentin (NEURONTIN) capsule 300 mg  300 mg Oral TID WC & HS Nwoko, Agnes I, NP   300 mg at 10/25/17 1259  . hydrOXYzine (ATARAX/VISTARIL) tablet 25 mg  25 mg Oral TID PRN Truman Hayward, FNP   25 mg at 10/23/17 1813  . ibuprofen (ADVIL,MOTRIN) tablet 600 mg  600 mg Oral Q6H  PRN Nira Conn A, NP   600 mg at 10/23/17 2114  . lamoTRIgine (LAMICTAL) tablet 25 mg  25 mg Oral BID Armandina Stammer I, NP   25 mg at 10/25/17 0852  . magnesium hydroxide (MILK OF MAGNESIA) suspension 30 mL  30 mL Oral Daily PRN Starkes, Takia S, FNP      . mirtazapine (REMERON) tablet 15 mg  15 mg Oral QHS Oneta Rack, NP   15 mg at 10/24/17 2120  . pantoprazole (PROTONIX) EC tablet 40 mg  40 mg Oral Daily Armandina Stammer I, NP   40 mg at 10/25/17 1610   Lab Results:  No results found for this or any previous visit (from the past 48 hour(s)). Blood Alcohol level:  Lab Results  Component Value Date   ETH <10 10/15/2017   ETH <5 04/11/2015   Metabolic Disorder Labs: Lab Results  Component Value Date   HGBA1C 5.1 10/16/2017   MPG 99.67 10/16/2017   No results found for: PROLACTIN Lab Results  Component Value Date   CHOL 182 10/16/2017   TRIG 260 (H) 10/16/2017   HDL 28 (L) 10/16/2017   CHOLHDL 6.5 10/16/2017   VLDL 52 (H) 10/16/2017   LDLCALC 102 (H) 10/16/2017   Physical Findings: AIMS: Facial and Oral Movements Muscles of Facial Expression: None, normal Lips and Perioral Area: None, normal Jaw: None, normal Tongue: None, normal,Extremity Movements Upper (arms, wrists, hands, fingers): None, normal Lower (legs, knees, ankles, toes): None, normal, Trunk Movements Neck, shoulders, hips: None, normal, Overall Severity Severity of abnormal movements (highest score from questions above): None, normal Incapacitation due to abnormal movements: None, normal Patient's awareness of abnormal movements  (rate only patient's report): No Awareness, Dental Status Current problems with teeth and/or dentures?: No Does patient usually wear dentures?: No  CIWA:    COWS:     Musculoskeletal: Strength & Muscle Tone: within normal limits Gait & Station: normal Patient leans: N/A  Psychiatric Specialty Exam: Physical Exam  Nursing note and vitals reviewed. Constitutional: He appears well-developed.  Psychiatric:  depresed    Review of Systems  Psychiatric/Behavioral: Positive for depression, substance abuse (Hx. Cocaine use disorder) and suicidal ideas (passive/fleeting thoughts). Negative for hallucinations and memory loss. The patient is nervous/anxious. The patient does not have insomnia.     Blood pressure (!) 155/105, pulse 79, temperature (!) 97.4 F (36.3 C), temperature source Oral, resp. rate 18, height 5\' 9"  (1.753 m), weight 99.8 kg (220 lb), SpO2 93 %.Body mass index is 32.49 kg/m.  General Appearance: Casual and Fairly Groomed  Eye Contact:  Good  Speech:  Clear and Coherent and Normal Rate  Volume:  Normal  Mood:  Better, Euthymic  Affect:  Congruent  Thought Process:  Coherent, Linear and Descriptions of Associations: Intact  Orientation:  Full (Time, Place, and Person)  Thought Content:  WNL  Suicidal Thoughts:  No, contracts for safety  Homicidal Thoughts:  Denies  Memory:  Immediate;   Good Recent;   Good Remote;   Good  Judgement:  Good  Insight:  Good  Psychomotor Activity:  Normal  Concentration:  Concentration: Good and Attention Span: Good  Recall:  Good  Fund of Knowledge:  Good  Language:  Good  Akathisia:  Negative  Handed:  Right  AIMS (if indicated):     Assets:  Communication Skills Desire for Improvement  ADL's:  Intact  Cognition:  WNL  Sleep:  Number of Hours: 6.25     Treatment Plan Summary: Daily  contact with patient to assess and evaluate symptoms and progress in treatment.  - Continue inpatient hospitalization.  - Will continue  today 10/25/2017 plan as below except where it is noted.  Depression.       - Continue Prozac 40 mg po daily on 10/21/2017       - Continue Lamictal 25 mg po daily for mood stabilization.  Anxiety.       - Continue Buspar 15 mg po bid.       - Continue Hydroxyzine 25 mg po Q 8 hours prn.  Agitation.       - Continue Gabapentin 300 mg po tid.  Insomnia.      - Continue Mirtazapine 7.5 mg po Q hs.  Other medical issues:   GERD: Continue Protonix 40 mg po daily in am.   Seizure disorder: Continue Depakote ER 1,000 mg po bid. (Obtain Depakote level on 10-20-17 am- reviewed, result at 43).    HTN: Continue amlodipine 10 mg po daily.              Continue atenolol 50 mg po daily.  - Patient to continue to attend group sessions.  - Discharge disposition plan ongoing.  Truman Hayward, FNP,  10/25/2017, 4:09 PMP  .Marland KitchenAgree with NP Progress Note

## 2017-10-25 NOTE — [facility] Group Notes (Signed)
LCSW Group Therapy Note  10/25/2017   10:00--11:00am    ** Late Entry**   Type of Therapy and Topic:  Group Therapy: Anger Cues and Responses  Participation Level:  Active   Description of Group:   In this group, patients learned how to recognize the physical, cognitive, emotional, and behavioral responses they have to anger-provoking situations.  They identified a recent time they became angry and how they reacted.  They analyzed how their reaction was possibly beneficial and how it was possibly unhelpful.  The group discussed a variety of healthier coping skills that could help with such a situation in the future.  Deep breathing was practiced briefly.  Therapeutic Goals: 1. Patients will remember their last incident of anger and how they felt emotionally and physically, what their thoughts were at the time, and how they behaved. 2. Patients will identify how their behavior at that time worked for them, as well as how it worked against them. 3. Patients will explore possible new behaviors to use in future anger situations. 4. Patients will learn that anger itself is normal and cannot be eliminated, and that healthier reactions can assist with resolving conflict rather than worsening situations.  Summary of Patient Progress:  The patient shared that his most recent time of anger was he discovered a friend had stolen from him and said his reaction was to be angered but also sad.The patient is able articulate an understanding of the physical and emotional responses associated with anger. The patient is open to adopting positive ways to express and cope with anger.  Therapeutic Modalities:   Cognitive Behavioral Therapy  Evorn Gong

## 2017-10-25 NOTE — Plan of Care (Signed)
  Problem: Education: Goal: Emotional status will improve Outcome: Not Progressing   

## 2017-10-25 NOTE — Progress Notes (Signed)
Patient ID: Barry Daugherty, male   DOB: 1970/11/19, 47 y.o.   MRN: [id number] D: Patient observed watching TV in the dayroom on approach. Pt reports his day was better. Pt reports he is tolerating medications well. Pt mood and affect appears depressed mood and anxious. Denies  SI/HI/AVH and pain.No behavioral issues noted.  A: Support and encouragement offered as needed. Medications administered as prescribed.  R: Patient is safe and cooperative on unit. Will continue to monitor  for safety and stability.

## 2017-10-26 NOTE — [facility] Group Notes (Signed)
[facility] LCSW Group Therapy Note  Date/Time:  10/26/2017 10:00-11:00AM  Type of Therapy and Topic:  Group Therapy:  Healthy and Unhealthy Supports  Participation Level:  Active   Description of Group:  Patients in this group were introduced to the idea of adding a variety of healthy supports to address the various needs in their lives.Patients discussed what additional healthy supports could be helpful in their recovery and wellness after discharge in order to prevent future hospitalizations.   An emphasis was placed on using counselor, doctor, therapy groups, 12-step groups, and problem-specific support groups to expand supports.  They also worked as a group on developing a specific plan for several patients to deal with unhealthy supports through boundary-setting, psychoeducation with loved ones, and even termination of relationships.   Therapeutic Goals:   1)  discuss importance of adding supports to stay well once out of the hospital  2)  compare healthy versus unhealthy supports and identify some examples of each  3)  generate ideas and descriptions of healthy supports that can be added  4)  offer mutual support about how to address unhealthy supports  5)  encourage active participation in and adherence to discharge plan    Summary of Patient Progress:  The patient stated that there are no current healthy supports in his life . He expressed a desire to reconnect with his church as a support that will help him in his recovery journey going forward.   Therapeutic Modalities:   Motivational Interviewing Brief Solution-Focused Therapy  Evorn Gong, LCSW

## 2017-10-26 NOTE — Progress Notes (Addendum)
[facility] MD Progress Note  10/26/2017 4:07 PM Barry Daugherty  MRN:  [id number]  Subjective: Barry Daugherty reports, "I woke up going up and not feeling good.  I am also having some abdominal pain.  [facility] am [facility]."  History: This is one of several admission assessments for this 47 year old Caucasian male with hx of substance use disorder & Major depression. He is known on this unit from previous admissions all related to substance use disorder & mental illness. He is admitted to the [facility] this time from the [facility] ED with complaints of suicidal ideations with plans to overdose on medications. He is seeking mental health stabilization. During this assessment, Barry Daugherty reports, "I was at a store yesterday when I started to feel sick. I was dizzy, not feeling really well. Then, someone saw me & called the ambulance for me. I was also feeling suicidal with plans to overdose on pills. I have been depressed x 12 years. Life has been very difficult for me. I'm disabled. I have been on medications for my depression. The last time I took the pills was a week ago. When I was taking my medicines, I was feeling better. I need to get back on those medicines.   Barry Daugherty is seen, chart reviewed. The chart findings discussed with the treatment. He presents alert, oriented x 4. His affect remains flat.  No new changes identified at this time.  He is visible on the unit, attending group sessions. He says he is not suicidal or homicidal.  Denies any delusions or paranoia. He does not appear to be responding to any internal stimuli. He is encouraged to continue to attend & participate in the group sessions to learned coping skills.He plans to attend [facility] inpatient program, on Tuesday for screening. He has agreed to continue current plan of care already in progress, Denies any adverse effects.He has completed his detox and is mentally stable at this time to discharge. Discussed use of hospital and  secondary gain for housing is inappropriate, and will need to prepare for discharge. He states that his screening for [facility] is on Tuesday, he is advised that he has been inpatient for 11 days, and no longer meets inpatient criteria will need to prepare for discharge.   Principal Problem: MDD (major depressive disorder), recurrent severe, without psychosis (HCC)  Diagnosis:   Patient Active Problem List   Diagnosis Date Noted  . MDD (major depressive disorder), recurrent severe, without psychosis (HCC) [F33.2] 10/15/2017  . MDD (major depressive disorder) [F32.9] 04/12/2015  . 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 Severe episode of recurrent major depressive disorder, without psychotic features (HCC) [F33.2]   . GAD (generalized anxiety disorder) [F41.1]    Total Time spent with patient: 15 minutes  Past Psychiatric History: See H&P  Past Medical History:  Past Medical History:  Diagnosis Date  . Anxiety   . Bipolar 1 disorder (HCC)   . Brain injury (HCC)   . Depression   . Diabetes mellitus without complication (HCC)   . Hypertension   . Seizures (HCC)     Past Surgical History:  Procedure Laterality Date  . CHOLECYSTECTOMY    . NEPHRECTOMY     Family History: History reviewed. No pertinent family history.  Family Psychiatric  History: See H&P  Social History:  Social History   Substance and Sexual Activity  Alcohol Use No   Comment: [facility]     Social History  Substance and Sexual Activity  Drug Use Yes  . Types: Cocaine, Marijuana   Comment: last used 2 weeks ago     Social History   Socioeconomic History  . Marital status: Single    Spouse name: Not on file  . Number of children: Not on file  . Years of education: Not on file  . Highest education level: Not on file  Occupational History  . Not on file  Social Needs  . Financial resource strain: Not on file  . Food insecurity:    Worry: Not on file    Inability: Not on  file  . Transportation needs:    Medical: Not on file    Non-medical: Not on file  Tobacco Use  . Smoking status: Former Smoker    Types: Cigarettes  . Smokeless tobacco: Never Used  Substance and Sexual Activity  . Alcohol use: No    Comment: [facility]  . Drug use: Yes    Types: Cocaine, Marijuana    Comment: last used 2 weeks ago   . Sexual activity: Not Currently  Lifestyle  . Physical activity:    Days per week: Not on file    Minutes per session: Not on file  . Stress: Not on file  Relationships  . Social connections:    Talks on phone: Not on file    Gets together: Not on file    Attends religious service: Not on file    Active member of club or organization: Not on file    Attends meetings of clubs or organizations: Not on file    Relationship status: Not on file  Other Topics Concern  . Not on file  Social History Narrative  . Not on file   Additional Social History:   Sleep: Good  Appetite:  Good  Current Medications: Current Facility-Administered Medications  Medication Dose Route Frequency Provider Last Rate Last Dose  . albuterol (PROVENTIL HFA;VENTOLIN HFA) 108 (90 Base) MCG/ACT inhaler 1-2 puff  1-2 puff Inhalation Q6H PRN Nwoko, Agnes I, NP      . alum & mag hydroxide-simeth (MAALOX/MYLANTA) 200-200-20 MG/5ML suspension 30 mL  30 mL Oral Q4H PRN Truman Hayward, FNP   30 mL at 10/26/17 0745  . amLODipine (NORVASC) tablet 10 mg  10 mg Oral Daily Armandina Stammer I, NP   10 mg at 10/26/17 0742  . atenolol (TENORMIN) tablet 50 mg  50 mg Oral Daily Armandina Stammer I, NP   50 mg at 10/26/17 0742  . busPIRone (BUSPAR) tablet 15 mg  15 mg Oral BID Armandina Stammer I, NP   15 mg at 10/26/17 0742  . cyclobenzaprine (FLEXERIL) tablet 5 mg  5 mg Oral TID PRN Charlynne Pander, MD   5 mg at 10/24/17 2120  . dicyclomine (BENTYL) capsule 10 mg  10 mg Oral TID AC & HS Oneta Rack, NP   10 mg at 10/26/17 1207  . divalproex (DEPAKOTE ER) 24 hr tablet 1,000 mg  1,000 mg  Oral QHS Nwoko, Agnes I, NP   1,000 mg at 10/25/17 2104  . FLUoxetine (PROZAC) capsule 40 mg  40 mg Oral Daily Mariel Craft, MD   40 mg at 10/26/17 0741  . gabapentin (NEURONTIN) capsule 300 mg  300 mg Oral TID WC & HS Nwoko, Agnes I, NP   300 mg at 10/26/17 1207  . hydrOXYzine (ATARAX/VISTARIL) tablet 25 mg  25 mg Oral TID PRN Truman Hayward, FNP   25 mg  at 10/23/17 1813  . ibuprofen (ADVIL,MOTRIN) tablet 600 mg  600 mg Oral Q6H PRN Nira Conn A, NP   600 mg at 10/26/17 0635  . lamoTRIgine (LAMICTAL) tablet 25 mg  25 mg Oral BID Armandina Stammer I, NP   25 mg at 10/26/17 0742  . magnesium hydroxide (MILK OF MAGNESIA) suspension 30 mL  30 mL Oral Daily PRN Truman Hayward, FNP   30 mL at 10/26/17 0746  . mirtazapine (REMERON) tablet 15 mg  15 mg Oral QHS Oneta Rack, NP   15 mg at 10/25/17 2104  . pantoprazole (PROTONIX) EC tablet 40 mg  40 mg Oral Daily Armandina Stammer I, NP   40 mg at 10/26/17 1610   Lab Results:  No results found for this or any previous visit (from the past 48 hour(s)). Blood Alcohol level:  Lab Results  Component Value Date   ETH <10 10/15/2017   ETH <5 04/11/2015   Metabolic Disorder Labs: Lab Results  Component Value Date   HGBA1C 5.1 10/16/2017   MPG 99.67 10/16/2017   No results found for: PROLACTIN Lab Results  Component Value Date   CHOL 182 10/16/2017   TRIG 260 (H) 10/16/2017   HDL 28 (L) 10/16/2017   CHOLHDL 6.5 10/16/2017   VLDL 52 (H) 10/16/2017   LDLCALC 102 (H) 10/16/2017   Physical Findings: AIMS: Facial and Oral Movements Muscles of Facial Expression: None, normal Lips and Perioral Area: None, normal Jaw: None, normal Tongue: None, normal,Extremity Movements Upper (arms, wrists, hands, fingers): None, normal Lower (legs, knees, ankles, toes): None, normal, Trunk Movements Neck, shoulders, hips: None, normal, Overall Severity Severity of abnormal movements (highest score from questions above): None, normal Incapacitation due to  abnormal movements: None, normal Patient's awareness of abnormal movements (rate only patient's report): No Awareness, Dental Status Current problems with teeth and/or dentures?: No Does patient usually wear dentures?: No  CIWA:    COWS:     Musculoskeletal: Strength & Muscle Tone: within normal limits Gait & Station: normal Patient leans: N/A  Psychiatric Specialty Exam: Physical Exam  Nursing note and vitals reviewed. Constitutional: He appears well-developed.  Psychiatric:  depresed    Review of Systems  Psychiatric/Behavioral: Positive for depression, substance abuse (Hx. Cocaine use disorder) and suicidal ideas (passive/fleeting thoughts). Negative for hallucinations and memory loss. The patient is nervous/anxious. The patient does not have insomnia.     Blood pressure 134/73, pulse 74, temperature 97.8 F (36.6 C), temperature source Oral, resp. rate 16, height 5\' 9"  (1.753 m), weight 99.8 kg (220 lb), SpO2 93 %.Body mass index is 32.49 kg/m.  General Appearance: Casual and Fairly Groomed  Eye Contact:  Good  Speech:  Clear and Coherent and Normal Rate  Volume:  Normal  Mood:  Better, Euthymic  Affect:  Congruent  Thought Process:  Coherent, Linear and Descriptions of Associations: Intact  Orientation:  Full (Time, Place, and Person)  Thought Content:  WNL  Suicidal Thoughts:  No, contracts for safety  Homicidal Thoughts:  Denies  Memory:  Immediate;   Good Recent;   Good Remote;   Good  Judgement:  Good  Insight:  Good  Psychomotor Activity:  Normal  Concentration:  Concentration: Good and Attention Span: Good  Recall:  Good  Fund of Knowledge:  Good  Language:  Good  Akathisia:  Negative  Handed:  Right  AIMS (if indicated):     Assets:  Communication Skills Desire for Improvement  ADL's:  Intact  Cognition:  WNL  Sleep:  Number of Hours: 6.25     Treatment Plan Summary: Daily contact with patient to assess and evaluate symptoms and progress in  treatment.  - Continue inpatient hospitalization.  - Will continue today 10/26/2017 plan as below except where it is noted.  Depression.       - Continue Prozac 40 mg po daily on 10/21/2017       - Continue Lamictal 25 mg po daily for mood stabilization.  Anxiety.       - Continue Buspar 15 mg po bid.       - Continue Hydroxyzine 25 mg po Q 8 hours prn.  Agitation.       - Continue Gabapentin 300 mg po tid.  Insomnia.      - Continue Mirtazapine 7.5 mg po Q hs.  Other medical issues:   GERD: Continue Protonix 40 mg po daily in am.   Seizure disorder: Continue Depakote ER 1,000 mg po bid. (Obtain Depakote level on 10-20-17 am- reviewed, result at 43).    HTN: Continue amlodipine 10 mg po daily.              Continue atenolol 50 mg po daily.  - Patient to continue to attend group sessions.  - Discharge disposition plan ongoing.  Truman Hayward, FNP,  10/26/2017, 4:07 PMP   .Marland KitchenAgree with NP Progress Note

## 2017-10-26 NOTE — Plan of Care (Signed)
  Problem: Education: Goal: Emotional status will improve Outcome: Progressing   

## 2017-10-26 NOTE — Progress Notes (Signed)
Nursing Progress Note: 7p-7a D: Pt currently presents with a depressed/sad affect and behavior. Pt states "I am kinda worried about going to [facility]. I've heard some bad things about it. I am ready to stay sober. I need to start my living my life." Interacting appropriately with the milieu. Pt reports good sleep during the previous night with current medication regimen. Pt did not attend wrap-up group.  A: Pt provided with medications per providers orders. Pt's labs and vitals were monitored throughout the night. Pt supported emotionally and encouraged to express concerns and questions. Pt educated on medications.  R: Pt's safety ensured with 15 minute and environmental checks. Pt currently denies SI, HI, and AVH. Pt verbally contracts to seek staff if SI,HI, or AVH occurs and to consult with staff before acting on any harmful thoughts. Will continue to monitor.

## 2017-10-26 NOTE — Progress Notes (Signed)
D Pt complained of stomach discomfort at med pass this morning at 0800. He reported he has not had a BM since Tuesday, that he always suffers with GERD, and " my stomach really hurts". He explained he had not had a BM " since Tues ( 7/10 ) and is given both maalox and mylanta and instructed to drink water, limit food intake today and walk.     A HE took his scheduled meds as planned, he completed his daily assessment and on this he wrote he has had SI today ( he contracted with this nurse to not hurt self) and he rated his depression, hopelessness and anxeity " 01/22/09", resepctively. He reported to this nurse that he had a BM this afternoon and his " stomach feels much better".     R Safety is in place

## 2017-10-27 MED ORDER — PANTOPRAZOLE SODIUM 40 MG PO TBEC: 40.0000 mg | DELAYED_RELEASE_TABLET | Freq: Every day | ORAL | 0 refills | Status: DC

## 2017-10-27 MED ORDER — TRAZODONE HCL 100 MG PO TABS: 100.0000 mg | ORAL_TABLET | Freq: Every evening | ORAL | 0 refills | Status: DC | PRN

## 2017-10-27 MED ORDER — GABAPENTIN 300 MG PO CAPS: 300.0000 mg | ORAL_CAPSULE | Freq: Three times a day (TID) | ORAL | 0 refills | Status: DC

## 2017-10-27 MED ORDER — BUSPIRONE HCL 15 MG PO TABS: 15.0000 mg | ORAL_TABLET | Freq: Two times a day (BID) | ORAL | 0 refills | Status: DC

## 2017-10-27 MED ORDER — HYDROXYZINE HCL 25 MG PO TABS: 25.0000 mg | ORAL_TABLET | Freq: Three times a day (TID) | ORAL | 0 refills | Status: DC | PRN

## 2017-10-27 MED ORDER — MIRTAZAPINE 15 MG PO TABS: 15.0000 mg | ORAL_TABLET | Freq: Every day | ORAL | 0 refills | Status: DC

## 2017-10-27 MED ORDER — ALBUTEROL SULFATE HFA 108 (90 BASE) MCG/ACT IN AERS: 1.0000 | INHALATION_SPRAY | Freq: Four times a day (QID) | RESPIRATORY_TRACT | Status: DC | PRN

## 2017-10-27 MED ORDER — FLUOXETINE HCL 40 MG PO CAPS: 40.0000 mg | ORAL_CAPSULE | Freq: Every day | ORAL | 0 refills | Status: DC

## 2017-10-27 MED ORDER — LAMOTRIGINE 25 MG PO TABS: 25.0000 mg | ORAL_TABLET | Freq: Two times a day (BID) | ORAL | 0 refills | Status: DC

## 2017-10-27 MED ORDER — ATENOLOL 50 MG PO TABS: 50.0000 mg | ORAL_TABLET | Freq: Every day | ORAL | 0 refills | Status: DC

## 2017-10-27 MED ORDER — AMLODIPINE BESYLATE 10 MG PO TABS
10.0000 mg | ORAL_TABLET | Freq: Every day | ORAL | 0 refills | Status: DC
Start: 2017-10-27 — End: 2017-10-30

## 2017-10-27 MED ORDER — DIVALPROEX SODIUM ER 500 MG PO TB24: 1000.0000 mg | ORAL_TABLET | Freq: Every day | ORAL | 0 refills | Status: AC

## 2017-10-27 MED ORDER — CYCLOBENZAPRINE HCL 5 MG PO TABS: 5.0000 mg | ORAL_TABLET | Freq: Three times a day (TID) | ORAL | 0 refills | Status: DC | PRN

## 2017-10-27 NOTE — Progress Notes (Signed)
Nursing Progress Note: 7p-7a D: Pt currently presents with a depressed/anxious affect and behavior. Pt states "I am scared to go. I have been here so long, and I love it. I don't want to go. I am depressed. I don't know what I'm going to do." Interacting minimally with the milieu. Pt reports good sleep during the previous night with current medication regimen.   A: Pt provided with medications per providers orders. Pt's labs and vitals were monitored throughout the night. Pt supported emotionally and encouraged to express concerns and questions. Pt educated on medications.  R: Pt's safety ensured with 15 minute and environmental checks. Pt currently denies SI, HI, and AVH. Pt verbally contracts to seek staff if SI,HI, or AVH occurs and to consult with staff before acting on any harmful thoughts. Will continue to monitor.

## 2017-10-27 NOTE — Progress Notes (Addendum)
  [facility] Adult Case Management Discharge Plan :  Will you be returning to the same living situation after discharge:  No. Pt plans to go to [facility] for screening and possible admission from here.  At discharge, do you have transportation home?: Yes,  taxi voucher in chart. PATIENT MUST DISCHARGE BY [facility]AXI BY 7AM ON 7/16 (TUES) IN ORDER TO GET TO [facility].  Do you have the ability to pay for your medications: Yes,  mental health  Release of information consent forms completed and submitted to medical records by CSW.  Patient to Follow up at: Follow-up Information    [facility] Follow up on 10/28/2017.   Why:  Appointment for assessment is Tuesday, 10/28/17 at 8:00am.  Contact information: [address] [place] [address] [phone]        [facility] Follow up.   Specialty:  Behavioral Health Why:  Walk in within 3 days of hospital/rehab discharge to be assessed for outpatient mental health services. Walk in hours: Monday-Friday 8am-10am. Thank you.  Contact information: [address] [place] [address] [phone]        [facility] Follow up.   Why:  Please call [facility] once you complete rehab in order to get set up with primary care physician. Thank you.  Contact information: [address] [place] [address] [phone]          Next level of care provider has access to [facility] Link:no  Safety Planning and Suicide Prevention discussed: Yes,  SPE completed with pt; pt declined to consent to collateral contact.   Have you used any form of tobacco in the last 30 days? (Cigarettes, Smokeless Tobacco, Cigars, and/or Pipes): No  Has patient been referred to the Quitline?: N/A patient is not a smoker  Patient has been referred for addiction treatment: Yes  Rona Ravens, LCSW 10/27/2017, 3:38 PM

## 2017-10-27 NOTE — [facility] Group Notes (Signed)
LCSW Group Therapy Note   10/27/2017 1:15pm   Type of Therapy and Topic:  Group Therapy:  Overcoming Obstacles   Participation Level:  Did Not Attend--pt invited. Chose to remain in bed.    Description of Group:    In this group patients will be encouraged to explore what they see as obstacles to their own wellness and recovery. They will be guided to discuss their thoughts, feelings, and behaviors related to these obstacles. The group will process together ways to cope with barriers, with attention given to specific choices patients can make. Each patient will be challenged to identify changes they are motivated to make in order to overcome their obstacles. This group will be process-oriented, with patients participating in exploration of their own experiences as well as giving and receiving support and challenge from other group members.   Therapeutic Goals: 1. Patient will identify personal and current obstacles as they relate to admission. 2. Patient will identify barriers that currently interfere with their wellness or overcoming obstacles.  3. Patient will identify feelings, thought process and behaviors related to these barriers. 4. Patient will identify two changes they are willing to make to overcome these obstacles:      Summary of Patient Progress   x   Therapeutic Modalities:   Cognitive Behavioral Therapy Solution Focused Therapy Motivational Interviewing Relapse Prevention Therapy  Rona Ravens, LCSW 10/27/2017 1:12 PM

## 2017-10-27 NOTE — Discharge Summary (Addendum)
Physician Discharge Summary Note  Patient:  Barry Daugherty is an 47 y.o., male  MRN:  [id number]  DOB:  [birth year]  Patient phone:  [phone] (home)   Patient address:   [place] [address],   Total Time spent with patient: Greater than 30 minutes  Date of Admission:  10/15/2017 Date of Discharge: 10/27/2017  Reason for Admission: Complaints of suicidal ideations with plans to overdose on medications.   Principal Problem: MDD (major depressive disorder), recurrent severe, without psychosis Barry Daugherty)  Discharge Diagnoses: Patient Active Problem List   Diagnosis Date Noted  . MDD (major depressive disorder), recurrent severe, without psychosis (HCC) [F33.2] 10/15/2017    Priority: High  . MDD (major depressive disorder) [F32.9] 04/12/2015  . 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 Severe episode of recurrent major depressive disorder, without psychotic features (HCC) [F33.2]   . GAD (generalized anxiety disorder) [F41.1]    Past Psychiatric History: See H&P  Past Medical History:  Past Medical History:  Diagnosis Date  . Anxiety   . Bipolar 1 disorder (HCC)   . Brain injury (HCC)   . Depression   . Diabetes mellitus without complication (HCC)   . Hypertension   . Seizures (HCC)     Past Surgical History:  Procedure Laterality Date  . CHOLECYSTECTOMY    . NEPHRECTOMY     Family History: History reviewed. No pertinent family history.  Family Psychiatric  History: See H&P  Social History:  Social History   Substance and Sexual Activity  Alcohol Use No   Comment: [facility]     Social History   Substance and Sexual Activity  Drug Use Yes  . Types: Cocaine, Marijuana   Comment: last used 2 weeks ago     Social History   Socioeconomic History  . Marital status: Single    Spouse name: Not on file  . Number of children: Not on file  . Years of education: Not on file  . Highest education level: Not on file  Occupational  History  . Not on file  Social Needs  . Financial resource strain: Not on file  . Food insecurity:    Worry: Not on file    Inability: Not on file  . Transportation needs:    Medical: Not on file    Non-medical: Not on file  Tobacco Use  . Smoking status: Former Smoker    Types: Cigarettes  . Smokeless tobacco: Never Used  Substance and Sexual Activity  . Alcohol use: No    Comment: [facility]  . Drug use: Yes    Types: Cocaine, Marijuana    Comment: last used 2 weeks ago   . Sexual activity: Not Currently  Lifestyle  . Physical activity:    Days per week: Not on file    Minutes per session: Not on file  . Stress: Not on file  Relationships  . Social connections:    Talks on phone: Not on file    Gets together: Not on file    Attends religious service: Not on file    Active member of club or organization: Not on file    Attends meetings of clubs or organizations: Not on file    Relationship status: Not on file  Other Topics Concern  . Not on file  Social History Narrative  . Not on file   Hospital Course: (Per admission notes): This is one of several admission assessments for this 47 year old Caucasian male with hx  of substance use disorder & Major depression. He is known on this unit from previous admissions all related to substance use disorder & mental illness. He is admitted to the [facility] this time from the [facility] ED with complaints of suicidal ideations with plans to overdose on medications. He is seeking mental health stabilization. During this assessment, Barry Daugherty reports, "I was at a store yesterday when I started to feel sick. I was dizzy, not feeling really well. Then, someone saw me & called the ambulance for me. I was also feeling suicidal with plans to overdose on pills. I have been depressed x 12 years. Life has been very difficult for me. I'm disabled. I have been on medications for my depression. The last time I took the pills was a week ago. When I  was taking my medicines, I was feeling better. I need to get back on those medicines. When I get very depressed like I'm now, the voices will come back. I have been hearing voices now for 2 days. I take Depakote for seizures. The last time I took them was a week ago. I no longer have a doctor to refill my medicines".  This is one of several discharge summaries from this [facility] hospital alone for  Barry Daugherty . He was re-admitted to the [facility] adult unit with complaints of worsening symptoms of depression & suicidal ideations with plans to overdose on medications. He also stated that he was hearing voices as well. He was seeking mental health stabilization & the need to restart his previous medications.  During the course of his hospitalization, Barry Daugherty was medicated & discharged on, Prozac 40 mg for depression, Hydroxyzine 25 mg prn for anxiety, Buspar 15 mg for anxiety, Depakote ER 1,000 mg for mood stabilization, Gabapentin 300 mg for agitation, Lamictal 25 mg for mood stabilization & & Trazodone Mirtazapine 15 mg for insomnia. He was enrolled & participated in the group counseling sessions being offered & held on this unit. He was counseled & learned coping skills that should help him cope better & maintain mood stability after discharge. He was resumed on all his pertinent home medications for the other previously existing medical issues presented. He tolerated his treatment regimen without any adverse effects reported.   While his treatment was on going, Barry Daugherty's improvement was monitored by observation & his daily reports of symptom reduction noted. His emotional & mental health status were monitored by daily self-inventory reports completed by him & the clinical staff. He was evaluated daily by the treatment team for mood stability & the need for continued recovery after discharge. He was offered further treatment options upon discharge & will follow up with the outpatient psychiatric services as listed below. He  will be receiving substance abuse treatment at the [facility] treatment center in [place], [place] after discharge today.    Upon discharge, Horatio was both mentally & medically stable. He is currently denying suicidal, homicidal ideation, auditory, visual/tactile hallucinations, delusional thoughts & or paranoia. He was provided with a 14 days worth, supply samples of his [facility] discharge medications. He left [facility] with all personal belongings in no apparent distress.   Physical Findings: AIMS: Facial and Oral Movements Muscles of Facial Expression: None, normal Lips and Perioral Area: None, normal Jaw: None, normal Tongue: None, normal,Extremity Movements Upper (arms, wrists, hands, fingers): None, normal Lower (legs, knees, ankles, toes): None, normal, Trunk Movements Neck, shoulders, hips: None, normal, Overall Severity Severity of abnormal movements (highest score from questions above): None, normal  Incapacitation due to abnormal movements: None, normal Patient's awareness of abnormal movements (rate only patient's report): No Awareness, Dental Status Current problems with teeth and/or dentures?: No Does patient usually wear dentures?: No  CIWA:    COWS:     Musculoskeletal: Strength & Muscle Tone: within normal limits Gait & Station: normal Patient leans: N/A  Psychiatric Specialty Exam: Review of Systems  Constitutional: Negative.   HENT: Negative.   Eyes: Negative.   Respiratory: Negative.   Cardiovascular: Negative.   Gastrointestinal: Negative.   Musculoskeletal: Negative.   Skin: Negative.   Neurological: Negative.   Endo/Heme/Allergies: Negative.   Psychiatric/Behavioral: Positive for depression (Stable) and substance abuse (Hx. of). Negative for hallucinations, memory loss and suicidal ideas. The patient has insomnia (Stable). The patient is not nervous/anxious.   All other systems reviewed and are negative.   Blood pressure 119/81, pulse 100, temperature 98.1  F (36.7 C), temperature source Oral, resp. rate 20, height 5\' 9"  (1.753 m), weight 99.8 kg (220 lb), SpO2 93 %.Body mass index is 32.49 kg/m.  See Md's SRA   Have you used any form of tobacco in the last 30 days? (Cigarettes, Smokeless Tobacco, Cigars, and/or Pipes): No  Has this patient used any form of tobacco in the last 30 days? (Cigarettes, Smokeless Tobacco, Cigars, and/or Pipes) No  Metabolic Disorder Labs:  Lab Results  Component Value Date   HGBA1C 5.1 10/16/2017   MPG 99.67 10/16/2017   No results found for: PROLACTIN Lab Results  Component Value Date   CHOL 182 10/16/2017   TRIG 260 (H) 10/16/2017   HDL 28 (L) 10/16/2017   CHOLHDL 6.5 10/16/2017   VLDL 52 (H) 10/16/2017   LDLCALC 102 (H) 10/16/2017   See Psychiatric Specialty Exam and Suicide Risk Assessment completed by Attending Physician prior to discharge.  Discharge destination:  Home  Is patient on multiple antipsychotic therapies at discharge:  No   Has Patient had three or more failed trials of antipsychotic monotherapy by history:  No  Recommended Plan for Multiple Antipsychotic Therapies: NA  Allergies as of 10/27/2017      Reactions   Ceftin [cefuroxime Axetil] Hives   Reglan [metoclopramide] Itching   Penicillins Hives      Medication List    STOP taking these medications   amitriptyline 150 MG tablet Commonly known as:  ELAVIL   folic acid 1 MG tablet Commonly known as:  FOLVITE   ibuprofen 800 MG tablet Commonly known as:  ADVIL,MOTRIN   multivitamin with minerals Tabs tablet   thiamine 100 MG tablet Commonly known as:  VITAMIN B-1   traZODone 100 MG tablet Commonly known as:  DESYREL     TAKE these medications     Indication  albuterol 108 (90 Base) MCG/ACT inhaler Commonly known as:  PROVENTIL HFA;VENTOLIN HFA Inhale 1-2 puffs into the lungs every 6 (six) hours as needed for wheezing or shortness of breath.  Indication:  Asthma   amLODipine 10 MG tablet Commonly known  as:  NORVASC Take 1 tablet (10 mg total) by mouth daily. For high blood pressure What changed:  additional instructions  Indication:  High Blood Pressure Disorder   atenolol 50 MG tablet Commonly known as:  TENORMIN Take 1 tablet (50 mg total) by mouth daily. For high blood pressure Start taking on:  10/28/2017 What changed:  additional instructions  Indication:  High Blood Pressure Disorder   busPIRone 15 MG tablet Commonly known as:  BUSPAR Take 1 tablet (15 mg total) by  mouth 2 (two) times daily. For anxiety What changed:    medication strength  how much to take  when to take this  additional instructions  Indication:  Anxiety Disorder, Major Depressive Disorder   cyclobenzaprine 5 MG tablet Commonly known as:  FLEXERIL Take 1 tablet (5 mg total) by mouth 3 (three) times daily as needed for muscle spasms.  Indication:  Muscle Spasm   divalproex 500 MG 24 hr tablet Commonly known as:  DEPAKOTE ER Take 2 tablets (1,000 mg total) by mouth at bedtime. For mood stabilization What changed:  additional instructions  Indication:  Mood stabilization   FLUoxetine 40 MG capsule Commonly known as:  PROZAC Take 1 capsule (40 mg total) by mouth daily. For depression Start taking on:  10/28/2017 What changed:    medication strength  how much to take  additional instructions  Indication:  Depression, Major Depressive Disorder   gabapentin 300 MG capsule Commonly known as:  NEURONTIN Take 1 capsule (300 mg total) by mouth 4 (four) times daily -  with meals and at bedtime. For agitation What changed:  additional instructions  Indication:  Agitation   hydrOXYzine 25 MG tablet Commonly known as:  ATARAX/VISTARIL Take 1 tablet (25 mg total) by mouth 3 (three) times daily as needed for anxiety.  Indication:  Feeling Anxious   lamoTRIgine 25 MG tablet Commonly known as:  LAMICTAL Take 1 tablet (25 mg total) by mouth 2 (two) times daily. For mood stabilization What changed:   additional instructions  Indication:  Mood stabilization   mirtazapine 15 MG tablet Commonly known as:  REMERON Take 1 tablet (15 mg total) by mouth at bedtime. For depression/sleep What changed:    medication strength  how much to take  additional instructions  Indication:  Major Depressive Disorder, Insomnia   pantoprazole 40 MG tablet Commonly known as:  PROTONIX Take 1 tablet (40 mg total) by mouth daily. For acid reflux What changed:  additional instructions  Indication:  Gastroesophageal Reflux Disease      Follow-up Information    [facility], [facility] Follow up on 10/28/2017.   Why:  Appointment for assessment is Tuesday, 10/28/17 at 8:00am.  Contact information: [address] [place] [address] [phone]        [facility] up.   Specialty:  Behavioral Health Why:  Walk in within 3 days of hospital/rehab discharge to be assessed for outpatient mental health services. Walk in hours: Monday-Friday 8am-10am. Thank you.  [address] [place] [address] [phone]          Follow-up recommendations: Activity:  As tolerated Diet: As recommended by your primary care doctor. Keep all scheduled follow-up appointments as recommended.   Comments: Patient is instructed prior to discharge to: Take all medications as prescribed by his/her mental healthcare provider. Report any adverse effects and or reactions from the medicines to his/her outpatient provider promptly. Patient has been instructed & cautioned: To not engage in alcohol and or illegal drug use while on prescription medicines. In the event of worsening symptoms, patient is instructed to call the crisis hotline, 911 and or go to the nearest ED for appropriate evaluation and treatment of symptoms. To follow-up with his/her primary care provider for your other medical issues, concerns and or health care needs.    Signed: Armandina Stammer, PMHNP,  FNP-BC 10/27/2017,  2:40 PM   Patient seen, Suicide Assessment Completed.  Disposition Plan Reviewed

## 2017-10-27 NOTE — [facility] Suicide Risk Assessment (Signed)
[facility] Discharge Suicide Risk Assessment   Principal Problem: MDD (major depressive disorder), recurrent severe, without psychosis (HCC) Discharge Diagnoses:  Patient Active Problem List   Diagnosis Date Noted  . MDD (major depressive disorder), recurrent severe, without psychosis (HCC) [F33.2] 10/15/2017  . MDD (major depressive disorder) [F32.9] 04/12/2015  . 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 Severe episode of recurrent major depressive disorder, without psychotic features (HCC) [F33.2]   . GAD (generalized anxiety disorder) [F41.1]     Total Time spent with patient: 30 minutes  Musculoskeletal: Strength & Muscle Tone: within normal limits Gait & Station: normal Patient leans: N/A  Psychiatric Specialty Exam: ROS no headache, no chest pain, no shortness of breath, reports mild abdominal pain , reports normal bowel movements, no diarrhea, no vomiting, no fever, no rash  Blood pressure 119/81, pulse 100, temperature 98.1 F (36.7 C), temperature source Oral, resp. rate 20, height 5\' 9"  (1.753 m), weight 99.8 kg (220 lb), SpO2 93 %.Body mass index is 32.49 kg/m.  General Appearance: Fairly Groomed  Patent attorney::  Good  Speech:  Normal Rate409  Volume:  Normal  Mood:  reports mood as partially improved, describes mood as 5/10 " middle of the road"  Affect:  more reactive, smiles at times appropriately   Thought Process:  Linear and Descriptions of Associations: Intact  Orientation:  Other:  fully alert and attentive  Thought Content:  no hallucinations, no delusions, not internally preoccupied  Suicidal Thoughts:  No denies suicidal or self injurious ideations, denies homicidal ideations   Homicidal Thoughts:  No  Memory:  recent and remote grossly intact   Judgement:  Fair-improving   Insight:  fair- improving   Psychomotor Activity:  Normal  Concentration:  Good  Recall:  Good  Fund of Knowledge:Good  Language: Good  Akathisia:  Negative  Handed:   Left  AIMS (if indicated):     Assets:  Communication Skills Desire for Improvement Resilience  Sleep:  Number of Hours: 6.75  Cognition: WNL  ADL's:  Intact   Mental Status Per Nursing Assessment::   On Admission:  Suicidal ideation indicated by patient, Self-harm thoughts  Demographic Factors:  47 year old single male, no children, unemployed, homeless   Loss Factors: Homelessness, substance abuse   Historical Factors: Reports he has history of mood disorder and anxiety, history of prior psychiatric admissions, history of suicide attempts in the past. History of substance abuse, identifies methamphetamine and cocaine as substances of choice    Risk Reduction Factors:   Positive coping skills or problem solving skills  Continued Clinical Symptoms:  At this time patient presents alert, attentive , fairly groomed, good eye contact, reports improving mood and decreased severity of depression, denies suicidal or self injurious ideations at this time, denies homicidal or violent ideations, denies hallucinations,no delusions,not internally preoccupied . Denies medication side effects. Has been noted to be more visible on unit, starting to be more interactive in day room, today polite and cooperative on approach   Cognitive Features That Contribute To Risk:  No gross cognitive deficits noted upon discharge. Is alert , attentive, and oriented x 3   Suicide Risk:  Mild:  Suicidal ideation of limited frequency, intensity, duration, and specificity.  There are no identifiable plans, no associated intent, mild dysphoria and related symptoms, good self-control (both objective and subjective assessment), few other risk factors, and identifiable protective factors, including available and accessible social support.  Follow-up Information    [facility] Follow up  on 10/28/2017.   Why:  Appointment for assessment is Tuesday, 10/28/17 at 8:00am.  Contact information: [address] [place] [address] [phone]        [facility] Follow up.   Specialty:  Behavioral Health Why:  Walk in within 3 days of hospital/rehab discharge to be assessed for outpatient mental health services. Walk in hours: Monday-Friday 8am-10am. Thank you.  Contact information: [address] [place] [address] [phone]           Plan Of Care/Follow-up recommendations:  Activity:  as tolerated  Diet:  heart healthy, diabetic diet  Tests:  NA Other:  See below  Patient is planning on seeking admission to [facility] tomorrow in AM- as such is scheduled to leave unit early in the morning tomorrow. Plans to follow up at [facility]. Has been referred to [facility] for ongoing outpatient medical management as needed     Craige Cotta, MD 10/27/2017, 3:25 PM

## 2017-10-27 NOTE — Progress Notes (Signed)
Recreation Therapy Notes  Date: 7.15.19 Time: 0930 Location: [address] Dayroom  Group Topic: Stress Management  Goal Area(s) Addresses:  Patient will verbalize importance of using healthy stress management.  Patient will identify positive emotions associated with healthy stress management.   Intervention: Stress Management  Activity :  Guided Imagery.  LRT introduced the stress management technique of guided imagery.  LRT read a script that allowed patients to picture their peaceful place.  Patients were to follow along as the script was read to engage in the activity.  Education:  Stress Management, Discharge Planning.   Education Outcome: Acknowledges edcuation/In group clarification offered/Needs additional education  Clinical Observations/Feedback: Pt did not attend group.      Caroll Rancher, LRT/CTRS         Caroll Rancher A 10/27/2017 12:26 PM

## 2017-10-27 NOTE — Progress Notes (Signed)
D) Pt. Reports 9/10 depression and 9/10 hopelessness and perpetual suicidality but appears superficially brighter when interacting with peers in dayroom.  Pt. Also c/o stomach discomfort.  A) Pt. Offered emotional support and comfort measures.  Medicated per MD order. R) Pt. Remains safe at this time and anticipates d/c [facility] at 0700 &/16/19.

## 2017-10-28 ENCOUNTER — Emergency Department (HOSPITAL_COMMUNITY): Payer: Self-pay

## 2017-10-28 ENCOUNTER — Other Ambulatory Visit: Payer: Self-pay

## 2017-10-28 ENCOUNTER — Observation Stay (HOSPITAL_COMMUNITY)
Admission: EM | Admit: 2017-10-28 | Discharge: 2017-10-30 | Disposition: A | Discharge status: Home / Self Care | Payer: Self-pay | Attending: Internal Medicine | Admitting: Internal Medicine

## 2017-10-28 ENCOUNTER — Encounter (HOSPITAL_COMMUNITY): Payer: Self-pay

## 2017-10-28 DIAGNOSIS — G4730 Sleep apnea, unspecified: Secondary | ICD-10-CM | POA: Insufficient documentation

## 2017-10-28 DIAGNOSIS — E1122 Type 2 diabetes mellitus with diabetic chronic kidney disease: Secondary | ICD-10-CM | POA: Insufficient documentation

## 2017-10-28 DIAGNOSIS — F411 Generalized anxiety disorder: Secondary | ICD-10-CM | POA: Insufficient documentation

## 2017-10-28 DIAGNOSIS — F319 Bipolar disorder, unspecified: Secondary | ICD-10-CM | POA: Insufficient documentation

## 2017-10-28 DIAGNOSIS — Z9049 Acquired absence of other specified parts of digestive tract: Secondary | ICD-10-CM | POA: Insufficient documentation

## 2017-10-28 DIAGNOSIS — E119 Type 2 diabetes mellitus without complications: Secondary | ICD-10-CM

## 2017-10-28 DIAGNOSIS — R079 Chest pain, unspecified: Secondary | ICD-10-CM | POA: Diagnosis present

## 2017-10-28 DIAGNOSIS — Z888 Allergy status to other drugs, medicaments and biological substances status: Secondary | ICD-10-CM | POA: Insufficient documentation

## 2017-10-28 DIAGNOSIS — I252 Old myocardial infarction: Secondary | ICD-10-CM | POA: Insufficient documentation

## 2017-10-28 DIAGNOSIS — K760 Fatty (change of) liver, not elsewhere classified: Secondary | ICD-10-CM | POA: Insufficient documentation

## 2017-10-28 DIAGNOSIS — Z794 Long term (current) use of insulin: Secondary | ICD-10-CM | POA: Insufficient documentation

## 2017-10-28 DIAGNOSIS — R45851 Suicidal ideations: Secondary | ICD-10-CM | POA: Insufficient documentation

## 2017-10-28 DIAGNOSIS — Z79899 Other long term (current) drug therapy: Secondary | ICD-10-CM | POA: Insufficient documentation

## 2017-10-28 DIAGNOSIS — Z8673 Personal history of transient ischemic attack (TIA), and cerebral infarction without residual deficits: Secondary | ICD-10-CM | POA: Insufficient documentation

## 2017-10-28 DIAGNOSIS — F1911 Other psychoactive substance abuse, in remission: Secondary | ICD-10-CM | POA: Insufficient documentation

## 2017-10-28 DIAGNOSIS — Z87891 Personal history of nicotine dependence: Secondary | ICD-10-CM | POA: Insufficient documentation

## 2017-10-28 DIAGNOSIS — J45909 Unspecified asthma, uncomplicated: Secondary | ICD-10-CM | POA: Insufficient documentation

## 2017-10-28 DIAGNOSIS — I129 Hypertensive chronic kidney disease with stage 1 through stage 4 chronic kidney disease, or unspecified chronic kidney disease: Secondary | ICD-10-CM | POA: Insufficient documentation

## 2017-10-28 DIAGNOSIS — G40409 Other generalized epilepsy and epileptic syndromes, not intractable, without status epilepticus: Secondary | ICD-10-CM | POA: Insufficient documentation

## 2017-10-28 DIAGNOSIS — N189 Chronic kidney disease, unspecified: Secondary | ICD-10-CM | POA: Insufficient documentation

## 2017-10-28 DIAGNOSIS — E7800 Pure hypercholesterolemia, unspecified: Secondary | ICD-10-CM | POA: Insufficient documentation

## 2017-10-28 DIAGNOSIS — Z881 Allergy status to other antibiotic agents status: Secondary | ICD-10-CM | POA: Insufficient documentation

## 2017-10-28 DIAGNOSIS — Z905 Acquired absence of kidney: Secondary | ICD-10-CM | POA: Insufficient documentation

## 2017-10-28 DIAGNOSIS — J9811 Atelectasis: Secondary | ICD-10-CM | POA: Insufficient documentation

## 2017-10-28 DIAGNOSIS — Z880 Allergy status to penicillin: Secondary | ICD-10-CM | POA: Insufficient documentation

## 2017-10-28 DIAGNOSIS — R0789 Other chest pain: Principal | ICD-10-CM | POA: Insufficient documentation

## 2017-10-28 DIAGNOSIS — Z590 Homelessness: Secondary | ICD-10-CM

## 2017-10-28 HISTORY — DX: Cerebral infarction, unspecified: I63.9

## 2017-10-28 HISTORY — DX: Other generalized epilepsy and epileptic syndromes, not intractable, without status epilepticus: G40.409

## 2017-10-28 HISTORY — DX: Sleep apnea, unspecified: G47.30

## 2017-10-28 HISTORY — DX: Headache: R51

## 2017-10-28 HISTORY — DX: Pure hypercholesterolemia, unspecified: E78.00

## 2017-10-28 HISTORY — DX: Migraine, unspecified, not intractable, without status migrainosus: G43.909

## 2017-10-28 HISTORY — DX: Headache, unspecified: R51.9

## 2017-10-28 HISTORY — DX: Type 1 diabetes mellitus without complications: E10.9

## 2017-10-28 HISTORY — DX: Chronic kidney disease, unspecified: N18.9

## 2017-10-28 HISTORY — DX: Unspecified asthma, uncomplicated: J45.909

## 2017-10-28 HISTORY — DX: Acute myocardial infarction, unspecified: I21.9

## 2017-10-28 LAB — RAPID URINE DRUG SCREEN, HOSP PERFORMED
Amphetamines: NOT DETECTED
BENZODIAZEPINES: NOT DETECTED
Cocaine: NOT DETECTED
Opiates: NOT DETECTED
Tetrahydrocannabinol: NOT DETECTED

## 2017-10-28 LAB — BASIC METABOLIC PANEL
ANION GAP: 10 (ref 5–15)
BUN: 12 mg/dL (ref 6–20)
CHLORIDE: 105 mmol/L (ref 98–111)
CO2: 26 mmol/L (ref 22–32)
Calcium: 9 mg/dL (ref 8.9–10.3)
Creatinine, Ser: 1.23 mg/dL (ref 0.61–1.24)
GFR calc non Af Amer: >60 mL/min (ref >60)
Glucose, Bld: 185 mg/dL — ABNORMAL HIGH (ref 70–99)
POTASSIUM: 4 mmol/L (ref 3.5–5.1)
Sodium: 141 mmol/L (ref 135–145)

## 2017-10-28 LAB — CBC
HEMATOCRIT: 42.6 % (ref 39.0–52.0)
HEMOGLOBIN: 14.1 g/dL (ref 13.0–17.0)
MCH: 29.8 pg (ref 26.0–34.0)
MCHC: 33.1 g/dL (ref 30.0–36.0)
MCV: 90.1 fL (ref 78.0–100.0)
PLATELETS: 290 10*3/uL (ref 150–400)
RBC: 4.73 MIL/uL (ref 4.22–5.81)
RDW: 13.2 % (ref 11.5–15.5)
WBC: 8 10*3/uL (ref 4.0–10.5)

## 2017-10-28 LAB — I-STAT TROPONIN, ED: Troponin i, poc: 0 ng/mL (ref 0.00–0.08)

## 2017-10-28 LAB — HEMOGLOBIN A1C
HEMOGLOBIN A1C: 5.3 % (ref 4.8–5.6)
MEAN PLASMA GLUCOSE: 105.41 mg/dL

## 2017-10-28 LAB — D-DIMER, QUANTITATIVE (NOT AT ARMC): D DIMER QUANT: 0.38 ug{FEU}/mL (ref 0.00–0.50)

## 2017-10-28 LAB — TROPONIN I

## 2017-10-28 MED ORDER — GI COCKTAIL ~~LOC~~
30.0000 mL | Freq: Once | ORAL | Status: AC
  Administered 2017-10-28: 30 mL via ORAL
  Filled 2017-10-28: qty 30

## 2017-10-28 MED ORDER — BUSPIRONE HCL 5 MG PO TABS
15.0000 mg | ORAL_TABLET | Freq: Two times a day (BID) | ORAL | Status: DC
  Administered 2017-10-28 – 2017-10-30 (×3): 15 mg via ORAL
  Filled 2017-10-28 (×3): qty 1

## 2017-10-28 MED ORDER — DIVALPROEX SODIUM ER 500 MG PO TB24
1000.0000 mg | ORAL_TABLET | Freq: Every day | ORAL | Status: DC
  Administered 2017-10-28 – 2017-10-29 (×2): 1000 mg via ORAL
  Filled 2017-10-28 (×2): qty 2

## 2017-10-28 MED ORDER — ACETAMINOPHEN 325 MG PO TABS: 650.0000 mg | ORAL_TABLET | ORAL | Status: DC | PRN

## 2017-10-28 MED ORDER — FLUOXETINE HCL 20 MG PO CAPS
40.0000 mg | ORAL_CAPSULE | Freq: Every day | ORAL | Status: DC
  Administered 2017-10-28 – 2017-10-30 (×2): 40 mg via ORAL
  Filled 2017-10-28 (×2): qty 2

## 2017-10-28 MED ORDER — CYCLOBENZAPRINE HCL 5 MG PO TABS: 5.0000 mg | ORAL_TABLET | Freq: Three times a day (TID) | ORAL | Status: DC | PRN

## 2017-10-28 MED ORDER — ONDANSETRON HCL 4 MG/2ML IJ SOLN: 4.0000 mg | Freq: Four times a day (QID) | INTRAMUSCULAR | Status: DC | PRN

## 2017-10-28 MED ORDER — LAMOTRIGINE 25 MG PO TABS
25.0000 mg | ORAL_TABLET | Freq: Two times a day (BID) | ORAL | Status: DC
  Administered 2017-10-28 – 2017-10-30 (×3): 25 mg via ORAL
  Filled 2017-10-28 (×3): qty 1

## 2017-10-28 MED ORDER — PANTOPRAZOLE SODIUM 40 MG PO TBEC
40.0000 mg | DELAYED_RELEASE_TABLET | Freq: Once | ORAL | Status: AC
  Administered 2017-10-28: 40 mg via ORAL
  Filled 2017-10-28: qty 1

## 2017-10-28 MED ORDER — INSULIN ASPART 100 UNIT/ML ~~LOC~~ SOLN
0.0000 [IU] | Freq: Three times a day (TID) | SUBCUTANEOUS | Status: DC
  Administered 2017-10-30 (×2): 1 [IU] via SUBCUTANEOUS

## 2017-10-28 MED ORDER — ALBUTEROL SULFATE (2.5 MG/3ML) 0.083% IN NEBU: 3.0000 mL | INHALATION_SOLUTION | Freq: Four times a day (QID) | RESPIRATORY_TRACT | Status: DC | PRN

## 2017-10-28 MED ORDER — AMLODIPINE BESYLATE 10 MG PO TABS
10.0000 mg | ORAL_TABLET | Freq: Every day | ORAL | Status: DC
  Administered 2017-10-28 – 2017-10-30 (×3): 10 mg via ORAL
  Filled 2017-10-28 (×3): qty 1

## 2017-10-28 MED ORDER — MIRTAZAPINE 15 MG PO TABS
15.0000 mg | ORAL_TABLET | Freq: Every day | ORAL | Status: DC
  Administered 2017-10-28 – 2017-10-29 (×2): 15 mg via ORAL
  Filled 2017-10-28 (×2): qty 1

## 2017-10-28 MED ORDER — GABAPENTIN 300 MG PO CAPS
300.0000 mg | ORAL_CAPSULE | Freq: Three times a day (TID) | ORAL | Status: DC
  Administered 2017-10-28 – 2017-10-30 (×6): 300 mg via ORAL
  Filled 2017-10-28 (×6): qty 1

## 2017-10-28 MED ORDER — NITROGLYCERIN 0.4 MG SL SUBL
0.4000 mg | SUBLINGUAL_TABLET | SUBLINGUAL | Status: DC | PRN
  Administered 2017-10-28 – 2017-10-30 (×3): 0.4 mg via SUBLINGUAL
  Filled 2017-10-28: qty 1

## 2017-10-28 MED ORDER — PANTOPRAZOLE SODIUM 40 MG PO TBEC
40.0000 mg | DELAYED_RELEASE_TABLET | Freq: Every day | ORAL | Status: DC
  Administered 2017-10-28 – 2017-10-30 (×2): 40 mg via ORAL
  Filled 2017-10-28 (×2): qty 1

## 2017-10-28 MED ORDER — FAMOTIDINE 20 MG PO TABS
20.0000 mg | ORAL_TABLET | Freq: Once | ORAL | Status: AC
  Administered 2017-10-28: 20 mg via ORAL
  Filled 2017-10-28: qty 1

## 2017-10-28 MED ORDER — HYDROXYZINE HCL 25 MG PO TABS
25.0000 mg | ORAL_TABLET | Freq: Three times a day (TID) | ORAL | Status: DC | PRN
  Administered 2017-10-29 – 2017-10-30 (×2): 25 mg via ORAL
  Filled 2017-10-28 (×2): qty 1

## 2017-10-28 MED ORDER — ENOXAPARIN SODIUM 40 MG/0.4ML ~~LOC~~ SOLN
40.0000 mg | SUBCUTANEOUS | Status: DC
  Administered 2017-10-28 – 2017-10-29 (×2): 40 mg via SUBCUTANEOUS
  Filled 2017-10-28 (×2): qty 0.4

## 2017-10-28 NOTE — Progress Notes (Signed)
Pt discharged from unit after AVS, transition report, prescriptions, and suicide risk assessment were reviewed. Pt confirmed information with teach back. Pt denies SI/HI/AVH and contracts to seek support if thoughts as such occur. Patient was offered support and encouragement. All belongings returned to patient. Pts questions were answered and was escorted safely to transportation.   

## 2017-10-28 NOTE — H&P (Addendum)
History and Physical    Barry Daugherty ZOX:[id number] DOB: [birth year] DOA: 10/28/2017  I have briefly reviewed the patient's prior medical records in [facility]  PCP: Patient, No Pcp Per  Patient coming from: home  Chief Complaint: chest pain  HPI: Barry Daugherty is a 47 y.o. male with medical history significant of major depressive disorder, bipolar disorder, polysubstance abuse, hypertension, diabetes mellitus, who presents to the hospital with chief complaint of chest pain.  He states that she has pain started about 6 hours ago roughly at 10 AM this morning, feels like a pressure in the middle of his chest radiating into his left shoulder and left arm.  He states that this pain reminds me of his previous heart attacks.  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  He gets part of his care in [place] as well as [place], occasional lives in [place].  He reports that he was hospitalized at [facility] up until yesterday for suicidal ideation, was discharged and he feels much better.  Currently denies depressed mood or suicidal or homicidal ideation.  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  He denies any alcohol or tobacco use.  He moved to [place] in the past few years to be with family, however family "did not accept" him and he moved back to [place] about a month ago.  With his chest pain he also has been having nausea, denies any vomiting.  He is also been complaining of "bandlike pain" in his lower abdomen.  Denies any fever or chills.  ED Course: In the emergency room he is afebrile, blood pressure is stable, he is satting 96% on room air.  His EKG is fairly unremarkable with very mild nonspecific ST segment changes.  His blood work shows a glucose of 185 but otherwise he has normal renal function and CBC is normal.  His d-dimer is 0.38.  Initial troponin was negative, however on repeat was 0.04.  His  urine drug screen is negative.  Cardiology was consulted who recommended overnight admission for obtaining serial cardiac enzymes, n.p.o. after midnight for potential stress test versus cardiac catheterization should his troponins increased significantly.  Review of Systems: As per HPI otherwise 10 point review of systems negative.   Past Medical History:  Diagnosis Date  . Anxiety   . Bipolar 1 disorder (HCC)   . Brain injury (HCC)   . Depression   . Diabetes mellitus without complication (HCC)   . Hypertension   . Seizures (HCC)     Past Surgical History:  Procedure Laterality Date  . CHOLECYSTECTOMY    . NEPHRECTOMY       reports that he has quit smoking. His smoking use included cigarettes. He has never used smokeless tobacco. He reports that he has current or past drug history. Drugs: Cocaine and Marijuana. He reports that he does not drink alcohol.  Allergies  Allergen Reactions  . Ceftin [Cefuroxime Axetil] Hives  . Reglan [Metoclopramide] Itching  . Penicillins Hives    Has patient had a PCN reaction causing immediate rash, facial/tongue/throat swelling, SOB or lightheadedness with hypotension: Yes Has patient had a PCN reaction causing severe rash involving mucus membranes or skin necrosis: No Has patient had a PCN reaction that required hospitalization: No Has patient had a PCN reaction occurring within the last 10 years: No If all of the above answers are "NO", then may proceed  with Cephalosporin use.     Family History  Problem Relation Age of Onset  . Healthy Mother   . Heart attack Father 29  . Pancreatic cancer Father   . Healthy Sister   . Healthy Brother     Prior to Admission medications   Medication Sig Start Date End Date Taking? Authorizing Provider  albuterol (PROVENTIL HFA;VENTOLIN HFA) 108 (90 Base) MCG/ACT inhaler Inhale 1-2 puffs into the lungs every 6 (six) hours as needed for wheezing or shortness of breath. 10/27/17  Yes Armandina Stammer I, NP    amLODipine (NORVASC) 10 MG tablet Take 1 tablet (10 mg total) by mouth daily. For high blood pressure 10/27/17  Yes Nwoko, Agnes I, NP  atenolol (TENORMIN) 50 MG tablet Take 1 tablet (50 mg total) by mouth daily. For high blood pressure 10/28/17  Yes Nwoko, Agnes I, NP  busPIRone (BUSPAR) 15 MG tablet Take 1 tablet (15 mg total) by mouth 2 (two) times daily. For anxiety 10/27/17  Yes Nwoko, Nicole Kindred I, NP  cyclobenzaprine (FLEXERIL) 5 MG tablet Take 1 tablet (5 mg total) by mouth 3 (three) times daily as needed for muscle spasms. 10/27/17  Yes Armandina Stammer I, NP  divalproex (DEPAKOTE ER) 500 MG 24 hr tablet Take 2 tablets (1,000 mg total) by mouth at bedtime. For mood stabilization 10/27/17  Yes Nwoko, Nicole Kindred I, NP  FLUoxetine (PROZAC) 40 MG capsule Take 1 capsule (40 mg total) by mouth daily. For depression 10/28/17  Yes Armandina Stammer I, NP  gabapentin (NEURONTIN) 300 MG capsule Take 1 capsule (300 mg total) by mouth 4 (four) times daily -  with meals and at bedtime. For agitation 10/27/17  Yes Armandina Stammer I, NP  hydrOXYzine (ATARAX/VISTARIL) 25 MG tablet Take 1 tablet (25 mg total) by mouth 3 (three) times daily as needed for anxiety. 10/27/17  Yes Armandina Stammer I, NP  lamoTRIgine (LAMICTAL) 25 MG tablet Take 1 tablet (25 mg total) by mouth 2 (two) times daily. For mood stabilization 10/27/17  Yes Nwoko, Nicole Kindred I, NP  METFORMIN HCL PO Take 1 tablet by mouth daily.   Yes [provider]  mirtazapine (REMERON) 15 MG tablet Take 1 tablet (15 mg total) by mouth at bedtime. For depression/sleep 10/27/17  Yes Armandina Stammer I, NP  pantoprazole (PROTONIX) 40 MG tablet Take 1 tablet (40 mg total) by mouth daily. For acid reflux 10/27/17  Yes Armandina Stammer I, NP    Physical Exam: Vitals:   10/28/17 1500 10/28/17 1530 10/28/17 1600 10/28/17 1700  BP: (!) 122/59 119/75 128/75 125/76  Pulse: 62 68 74 86  Resp: 13 17 12 14   Temp:      TempSrc:      SpO2: 92% 95% 95% 95%      Constitutional: NAD, calm,  comfortable Eyes: PERRL, lids and conjunctivae normal ENMT: Mucous membranes are moist.  Neck: normal, supple Respiratory: clear to auscultation bilaterally, no wheezing, no crackles. Normal respiratory effort. No accessory muscle use.  Cardiovascular: Regular rate and rhythm, no murmurs / rubs / gallops. No extremity edema. 2+ pedal pulses.  Abdomen: Mild tenderness in the lower quadrants, no guarding, no rebound, no masses palpated. Bowel sounds positive.  Musculoskeletal: no clubbing / cyanosis. Normal muscle tone.  Skin: no rashes, lesions, ulcers. No induration Neurologic: CN 2-12 grossly intact. Strength 5/5 in all 4.  Psychiatric: Normal judgment and insight. Alert and oriented x 3. Normal mood.   Labs on Admission: I have personally reviewed following labs and imaging studies  CBC: Recent Labs  Lab 10/22/17 1923 10/28/17 1015  WBC 7.7 8.0  HGB 13.2 14.1  HCT 40.4 42.6  MCV 91.2 90.1  PLT 330 290   Basic Metabolic Panel: Recent Labs  Lab 10/22/17 1923 10/28/17 1015  NA 142 141  K 4.3 4.0  CL 107 105  CO2 25 26  GLUCOSE 150* 185*  BUN 15 12  CREATININE 1.51* 1.23  CALCIUM 8.7* 9.0   GFR: Estimated Creatinine Clearance: 89.2 mL/min (by C-G formula based on SCr of 1.23 mg/dL). Liver Function Tests: No results for input(s): AST, ALT, ALKPHOS, BILITOT, PROT, ALBUMIN in the last 168 hours. No results for input(s): LIPASE, AMYLASE in the last 168 hours. No results for input(s): AMMONIA in the last 168 hours. Coagulation Profile: No results for input(s): INR, PROTIME in the last 168 hours. Cardiac Enzymes: Recent Labs  Lab 10/28/17 1317  TROPONINI 0.04*   BNP (last 3 results) No results for input(s): PROBNP in the last 8760 hours. HbA1C: No results for input(s): HGBA1C in the last 72 hours. CBG: No results for input(s): GLUCAP in the last 168 hours. Lipid Profile: No results for input(s): CHOL, HDL, LDLCALC, TRIG, CHOLHDL, LDLDIRECT in the last 72  hours. Thyroid Function Tests: No results for input(s): TSH, T4TOTAL, FREET4, T3FREE, THYROIDAB in the last 72 hours. Anemia Panel: No results for input(s): VITAMINB12, FOLATE, FERRITIN, TIBC, IRON, RETICCTPCT in the last 72 hours. Urine analysis:    Component Value Date/Time   COLORURINE YELLOW 05/22/2016 2034   APPEARANCEUR HAZY (A) 05/22/2016 2034   LABSPEC 1.028 05/22/2016 2034   PHURINE 5.0 05/22/2016 2034   GLUCOSEU NEGATIVE 05/22/2016 2034   HGBUR NEGATIVE 05/22/2016 2034   BILIRUBINUR NEGATIVE 05/22/2016 2034   KETONESUR NEGATIVE 05/22/2016 2034   PROTEINUR NEGATIVE 05/22/2016 2034   NITRITE NEGATIVE 05/22/2016 2034   LEUKOCYTESUR NEGATIVE 05/22/2016 2034     Radiological Exams on Admission: Dg Chest 2 View  Result Date: 10/28/2017 CLINICAL DATA:  Chest pain. EXAM: CHEST - 2 VIEW COMPARISON:  10/22/2017. FINDINGS: Mild prominence of superior mediastinum noted. This may be from slight rotation and AP projection. PA and lateral chest x-ray suggested for further evaluation. Hilar structures normal. Heart size normal. Low lung volumes with mild bibasilar atelectasis. No pleural effusion or pneumothorax. No acute bony abnormality. IMPRESSION: Mild prominence of superior mediastinum noted. This may be from slight rotation and AP projection. PA lateral chest x-ray suggested for further evaluation. 2.  Low lung volumes with mild bibasilar atelectasis. Electronically Signed   By: Maisie Fus  Register   On: 10/28/2017 10:22    EKG: Independently reviewed.  Sinus rhythm, nonspecific ST segment changes  Assessment/Plan Active Problems:   Chest pain    Chest pain -We will admit patient to the hospital on telemetry for chest pain rule out.  Will cycle cardiac enzymes overnight.  Appreciate cardiology consultation, keep n.p.o. after midnight. -Concerning that patient has underlying coronary artery disease.  He apparently had a cardiac catheterization in [place] in 2018 but does not  recall the results.  Type 2 diabetes mellitus -Hold metformin in case catheterization is needed with contrast.  Placed on sliding scale.  Obtain hemoglobin A1c  Depressive disorder/bipolar/recent hospitalization with suicidal ideation -This appears stable on my interview, recently discharged from behavioral health on 7/15.  Continue home medications  Polysubstance abuse -Per patient has been clean for 7 months   DVT prophylaxis: Lovenox  Code Status: Full code  Family Communication: no family at bedside Disposition Plan: home when  ready  Consults called: cardiology      Admission status: observation   At the point of initial evaluation, it is my clinical opinion that admission for OBSERVATION is reasonable and necessary because the patient's presenting complaints in the context of their chronic conditions represent sufficient risk of deterioration or significant morbidity to constitute reasonable grounds for close observation in the hospital setting, but that the patient may be medically stable for discharge from the hospital within 24 to 48 hours.   Pamella Pert, MD [facility] Pager [phone]  If 7PM-7AM, please contact night-coverage [email or web address] Password [facility]  10/28/2017, 6:02 PM

## 2017-10-28 NOTE — ED Triage Notes (Signed)
[facility]- pt coming in with complaint of chest pain that started around 0900. Pt reports pain got gradually worse and radiates down his left arm. Pt seen here recently for same and dx with anxiety. 20g IV left forearm. 324mg  of aspiring, 1 nitro, and 4mg  of zofran.

## 2017-10-28 NOTE — ED Notes (Signed)
Cardiology has now rounded on the patient.

## 2017-10-28 NOTE — ED Notes (Signed)
Urinal at bedside.  
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
## 2017-10-28 NOTE — ED Notes (Signed)
Food given to patient until his tray arrives.

## 2017-10-28 NOTE — ED Notes (Signed)
Report attempted x 1

## 2017-10-28 NOTE — Progress Notes (Signed)
Called by charge RN. Pt c/o chest pain 7/10 upon arrival from ED. NP Schorr notified and ordered Nitro SL, EKG done- NSR, Upon arrival, pt resting in bed with no acute distress noted. 2nd dose of Nitro given on arrival. CP now 5/10. Lab at bedside getting a repeat troponin (initial 0.04). Pt requesting to eat and drink. Plan to be NPO after midnight for possible stress test vs cath. Instructed RN to continue to monitor for CP and notify NP with any increase in troponin.   2145: rounded on pt, RN states he is fine with no c/o of pain at this time. Repeat Troponin 0.03. Will continue to monitor.

## 2017-10-28 NOTE — ED Provider Notes (Signed)
Emergency Department Provider Note   I have reviewed the triage vital signs and the nursing notes.   HISTORY  Chief Complaint Chest Pain   HPI Barry Daugherty is a 47 y.o. male with multiple medical problems as documented below the presents to the emergency department today with left-sided chest and upper abdominal pain.  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  He just recently came here from [place] on a 5-day bus ride but was able to ambulate around on all of those days.  He was here for chest pain about a week ago had delta troponins that were negative and unremarkable EKG.  Patient was discharged to behavioral health and was [facility].  Patient has had chest pain a few times mostly 3045 minutes after eating but also does seem to be worse with taking a deep breath.  No substance abuse recently.  No recent illnesses.  Does not smoke cigarettes. No other associated or modifying symptoms.    Past Medical History:  Diagnosis Date  . Anxiety   . Asthma   . Bipolar 1 disorder (HCC)   . Brain injury (HCC)   . Chronic kidney disease    nephrectomy 1975  . Depression   . Grand mal seizure [facility])    "controlled w/RX" (10/28/2017)  . Headache    "couple/week" (10/28/2017)  . High cholesterol   . Hypertension   . Migraine    "couple/year" (10/28/2017)  . Myocardial infarction (HCC) 04/2017  . Sleep apnea    "suppose to have CPAP; I don't have one" (10/28/2017)  . Stroke [facility]) 03/2017   "left side is weaker since" (10/28/2017)  . Type I diabetes mellitus (HCC)    "dx'd 2005; can't afford insulin so I use pills" (10/28/2017)    Patient Active Problem List   Diagnosis Date Noted  . Chest pain 10/28/2017  . MDD (major depressive disorder), recurrent severe, without psychosis (HCC) 10/15/2017  . MDD (major depressive disorder) 04/12/2015  . Substance induced mood disorder (HCC) 04/04/2015  . Severe episode  of recurrent major depressive disorder, without psychotic features (HCC)   . GAD (generalized anxiety disorder)     Past Surgical History:  Procedure Laterality Date  . CHOLECYSTECTOMY OPEN    . NEPHRECTOMY Left 1975      Allergies Ceftin [cefuroxime axetil]; Reglan [metoclopramide]; and Penicillins  Family History  Problem Relation Age of Onset  . Healthy Mother   . Heart attack Father 45  . Pancreatic cancer Father   . Healthy Sister   . Healthy Brother     Social History Social History   Tobacco Use  . Smoking status: Former Smoker    Packs/day: 0.50    Years: 29.00    Pack years: 14.50    Types: Cigarettes    Last attempt to quit: 04/15/2017    Years since quitting: 0.5  . Smokeless tobacco: Never Used  Substance Use Topics  . Alcohol use: No    Comment: 10/28/2017 "nothing to drink since 2005; I'm at [facility] now"  . Drug use: Yes    Types: Cocaine, Marijuana, Methamphetamines    Comment: 10/28/2017 "clean for 6 months now"    Review of Systems  All other systems negative except as documented in the HPI. All pertinent positives and negatives as reviewed in the HPI. ____________________________________________   PHYSICAL EXAM:  VITAL SIGNS: ED Triage Vitals  Enc Vitals Group  BP 10/28/17 1008 120/69     Pulse Rate 10/28/17 1008 96     Resp 10/28/17 1008 16     Temp 10/28/17 1008 99 F (37.2 C)     Temp Source 10/28/17 1008 Oral     SpO2 10/28/17 1008 96 %     Weight --      Height --      Head Circumference --      Peak Flow --      Pain Score 10/28/17 1004 9     Pain Loc --      Pain Edu? --      Excl. in GC? --     Constitutional: Alert and oriented. Well appearing and in no acute distress. Eyes: Conjunctivae are normal. PERRL. EOMI. Head: Atraumatic. Nose: No congestion/rhinnorhea. Mouth/Throat: Mucous membranes are moist.  Oropharynx non-erythematous. Neck: No stridor.  No meningeal signs.   Cardiovascular: Normal rate, regular  rhythm. Good peripheral circulation. Grossly normal heart sounds.   Respiratory: Normal respiratory effort.  No retractions. Lungs CTAB. Gastrointestinal: Soft and nontender. No distention.  Musculoskeletal: No lower extremity tenderness nor edema. No gross deformities of extremities. Neurologic:  Normal speech and language. No gross focal neurologic deficits are appreciated.  Skin:  Skin is warm, dry and intact. No rash noted.  ____________________________________________   LABS (all labs ordered are listed, but only abnormal results are displayed)  Labs Reviewed  BASIC METABOLIC PANEL - Abnormal; Notable for the following components:      Result Value   Glucose, Bld 185 (*)    All other components within normal limits  TROPONIN I - Abnormal; Notable for the following components:   Troponin I 0.04 (*)    All other components within normal limits  RAPID URINE DRUG SCREEN, HOSP PERFORMED - Abnormal; Notable for the following components:   Barbiturates   (*)    Value: Result not available. Reagent lot number recalled by manufacturer.   All other components within normal limits  CBC  D-DIMER, QUANTITATIVE (NOT AT [facility])  TROPONIN I  HEMOGLOBIN A1C  HIV ANTIBODY (ROUTINE TESTING)  TROPONIN I  TROPONIN I  I-STAT TROPONIN, ED   ____________________________________________  EKG   EKG Interpretation  Date/Time:  Tuesday October 28 2017 10:06:57 EDT Ventricular Rate:  99 PR Interval:  148 QRS Duration: 78 QT Interval:  350 QTC Calculation: 449 R Axis:   97 Text Interpretation:  Normal sinus rhythm Rightward axis Nonspecific ST abnormality Abnormal ECG No significant change since last tracing Confirmed by Marily Memos [phone]) on 10/28/2017 1:04:32 PM       ____________________________________________  RADIOLOGY  Dg Chest 2 View  Result Date: 10/28/2017 CLINICAL DATA:  Chest pain. EXAM: CHEST - 2 VIEW COMPARISON:  10/22/2017. FINDINGS: Mild prominence of superior mediastinum  noted. This may be from slight rotation and AP projection. PA and lateral chest x-ray suggested for further evaluation. Hilar structures normal. Heart size normal. Low lung volumes with mild bibasilar atelectasis. No pleural effusion or pneumothorax. No acute bony abnormality. IMPRESSION: Mild prominence of superior mediastinum noted. This may be from slight rotation and AP projection. PA lateral chest x-ray suggested for further evaluation. 2.  Low lung volumes with mild bibasilar atelectasis. Electronically Signed   By: Maisie Fus  Register   On: 10/28/2017 10:22    ____________________________________________   PROCEDURES  Procedure(s) performed:   Procedures   ____________________________________________   INITIAL IMPRESSION / ASSESSMENT AND PLAN / ED COURSE  Low risk on [place] but did have  a heart rate of 110 so we will get a d-dimer.  Patient has a heart score of 3 and has a recent negative heart cath of 2018.  I believe his heart attacks that he talks about her likely from stimulant use and her likely end STEMI's versus just concern for heart attack and thus observational stays.  He said multiple negative stress tests.  With a low heart score and no history of atherosclerotic disease we will do delta troponins to rule out ACS.  Will as his symptoms do seem to be somewhat related to eating and in his stomach we will treat him with GI cocktail, PPI and H2 blocker as well. Likely dischargable.   meds didn't really seem to help much.  D dimer negative, doubt PE.  Second troponin positive, will add on UDS, consult cardiology for recommendations.   Cardiology recommends medicine admission.    Pertinent labs & imaging results that were available during my care of the patient were reviewed by me and considered in my medical decision making (see chart for details).  ____________________________________________  FINAL CLINICAL IMPRESSION(S) / ED DIAGNOSES  Final diagnoses:  None      MEDICATIONS GIVEN DURING THIS VISIT:  Medications  albuterol (PROVENTIL) (2.5 MG/3ML) 0.083% nebulizer solution 3 mL (has no administration in time range)  amLODipine (NORVASC) tablet 10 mg (10 mg Oral Given 10/28/17 2214)  busPIRone (BUSPAR) tablet 15 mg (15 mg Oral Given 10/28/17 2211)  cyclobenzaprine (FLEXERIL) tablet 5 mg (has no administration in time range)  divalproex (DEPAKOTE ER) 24 hr tablet 1,000 mg (1,000 mg Oral Given 10/28/17 2211)  FLUoxetine (PROZAC) capsule 40 mg (40 mg Oral Given 10/28/17 2214)  gabapentin (NEURONTIN) capsule 300 mg (300 mg Oral Given 10/28/17 2211)  hydrOXYzine (ATARAX/VISTARIL) tablet 25 mg (has no administration in time range)  lamoTRIgine (LAMICTAL) tablet 25 mg (25 mg Oral Given 10/28/17 2211)  mirtazapine (REMERON) tablet 15 mg (15 mg Oral Given 10/28/17 2211)  pantoprazole (PROTONIX) EC tablet 40 mg (40 mg Oral Given 10/28/17 2215)  acetaminophen (TYLENOL) tablet 650 mg (has no administration in time range)  ondansetron (ZOFRAN) injection 4 mg (has no administration in time range)  enoxaparin (LOVENOX) injection 40 mg (40 mg Subcutaneous Given 10/28/17 2211)  insulin aspart (novoLOG) injection 0-9 Units (has no administration in time range)  nitroGLYCERIN (NITROSTAT) SL tablet 0.4 mg (0.4 mg Sublingual Given 10/28/17 1957)  pantoprazole (PROTONIX) EC tablet 40 mg (40 mg Oral Given 10/28/17 1357)  gi cocktail (Maalox,Lidocaine,Donnatal) (30 mLs Oral Given 10/28/17 1321)  famotidine (PEPCID) tablet 20 mg (20 mg Oral Given 10/28/17 1321)     NEW OUTPATIENT MEDICATIONS STARTED DURING THIS VISIT:  Current Discharge Medication List      Note:  This note was prepared with assistance of Dragon voice recognition software. Occasional wrong-word or sound-a-like substitutions may have occurred due to the inherent limitations of voice recognition software.   Marily Memos, MD 10/28/17 2250

## 2017-10-29 LAB — TROPONIN I: Troponin I: <0.03 ng/mL (ref <0.03)

## 2017-10-29 LAB — HIV ANTIBODY (ROUTINE TESTING W REFLEX): HIV Screen 4th Generation wRfx: NONREACTIVE

## 2017-10-29 LAB — GLUCOSE, CAPILLARY
GLUCOSE-CAPILLARY: 93 mg/dL (ref 70–99)
GLUCOSE-CAPILLARY: 103 mg/dL — AB (ref 70–99)
GLUCOSE-CAPILLARY: 108 mg/dL — AB (ref 70–99)
Glucose-Capillary: 98 mg/dL (ref 70–99)

## 2017-10-29 MED ORDER — METOPROLOL TARTRATE 25 MG PO TABS
25.0000 mg | ORAL_TABLET | Freq: Once | ORAL | Status: AC
Start: 2017-10-29 — End: 2017-10-29
  Administered 2017-10-29: 25 mg via ORAL
  Filled 2017-10-29: qty 1

## 2017-10-29 MED ORDER — METOPROLOL TARTRATE 50 MG PO TABS
50.0000 mg | ORAL_TABLET | Freq: Two times a day (BID) | ORAL | Status: DC
  Administered 2017-10-29 – 2017-10-30 (×2): 50 mg via ORAL
  Filled 2017-10-29 (×2): qty 1

## 2017-10-29 MED ORDER — METOPROLOL TARTRATE 25 MG PO TABS
25.0000 mg | ORAL_TABLET | Freq: Once | ORAL | Status: AC
  Administered 2017-10-29: 25 mg via ORAL
  Filled 2017-10-29: qty 1

## 2017-10-29 MED ORDER — IOPAMIDOL (ISOVUE-370) INJECTION 76%
INTRAVENOUS | Status: AC
  Filled 2017-10-29: qty 100

## 2017-10-29 NOTE — Clinical Social Work Note (Signed)
CSW acknowledges consult regarding difficulty affording insulin. Will notify RNCM in morning progression meeting.  CSW signing off. Consult again if any social work needs arise.  Charlynn Court, CSW [phone]

## 2017-10-29 NOTE — Care Management Note (Signed)
Case Management Note  Patient Details  Name: Barry Daugherty MRN: [id number] Date of Birth: 1971/02/04  Subjective/Objective:    Chest Pain               Action/Plan: CM talked to patient at the bedside; he was recently discharged from [facility] 10/27/2017; he is from [place], where his family is but states that they are non supportive. He plans to go to [facility] for rehab ( substance abuse) at discharge. Sarah SW updated. CM will continue to follow for progression of care.  Expected Discharge Date:    possibly 10/31/2017              Expected Discharge Plan:   Rehab for substance abuse  In-House Referral:   SW, Artist  Status of Service:   In progress  Reola Mosher [phone] 10/29/2017, 11:22 AM

## 2017-10-29 NOTE — Progress Notes (Signed)
PROGRESS NOTE    Barry Daugherty  WUJ:[id number] DOB: March 28, 1971 DOA: 10/28/2017 PCP: Patient, No Pcp Per    Brief Narrative: Barry Daugherty is a 47 y.o. male with medical history significant of major depressive disorder, bipolar disorder, polysubstance abuse, hypertension, diabetes mellitus, who presents to the hospital with chief complaint of chest pain.  He states that she has pain started about 6 hours ago roughly at 10 AM this morning, feels like a pressure in the middle of his chest radiating into his left shoulder and left arm.  He states that this pain reminds me of his previous heart attacks.  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  He gets part of his care in [place] as well as [place], occasional lives in [place].  He reports that [facility] until yesterday for suicidal ideation, was discharged and he feels much better.  Currently denies depressed mood or suicidal or homicidal ideation.  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  He denies any alcohol or tobacco use.  He moved to [place] in the past few years to be with family, however family "did not accept" him and he moved back to [place] about a month ago.  With his chest pain he also has been having nausea, denies any vomiting.  He is also been complaining of "bandlike pain" in his lower abdomen.  Denies any fever or chills.  ED Course: In the emergency room he is afebrile, blood pressure is stable, he is satting 96% on room air.  His EKG is fairly unremarkable with very mild nonspecific ST segment changes.  His blood work shows a glucose of 185 but otherwise he has normal renal function and CBC is normal.  His d-dimer is 0.38.  Initial troponin was negative, however on repeat was 0.04.  His urine drug screen is negative.  Cardiology was consulted who recommended overnight admission for obtaining serial cardiac enzymes,  n.p.o. after midnight for potential stress test versus cardiac catheterization should his troponins increased significantly.     Assessment & Plan:   Active Problems:   Chest pain  1-Chest pain;  For cardiac ct hopefully today.  D dimer 0.38 Troponin negative.   2-DM;  SSI Hb A1c 5.5.   3-Depressive disorder/bipolar/recent hospitalization with suicidal ideation - recently discharged from behavioral health on 7/15.  Continue home medications  4-Polysubstance abuse -Per patient has been clean for 7 months   HTN; on Norvasc.     DVT prophylaxis: Lovenox.  Code Status: full code.  Family Communication: care discussed with patient.  Disposition Plan: await cardiac evaluation.    Consultants:   Cardiology    Procedures:   Cardiac CT   Antimicrobials:  none   Subjective: Denies chest pain today   Objective: Vitals:   10/29/17 0503 10/29/17 0815 10/29/17 1104 10/29/17 1132  BP: 125/77 (!) 142/63 (!) 119/48 117/76  Pulse: 74 77 65 78  Resp: 18 17  16   Temp: 98.1 F (36.7 C) 98.2 F (36.8 C)  98.6 F (37 C)  TempSrc: Oral Oral  Oral  SpO2: 97% 94%  92%  Weight: 100.5 kg (221 lb 9.6 oz)     Height:       No intake or output data in the 24 hours ending 10/29/17 1646 Filed Weights   10/29/17 0503  Weight: 100.5 kg (221 lb 9.6 oz)  Examination:  General exam: Appears calm and comfortable  Respiratory system: Clear to auscultation. Respiratory effort normal. Cardiovascular system: S1 & S2 heard, RRR. No JVD, murmurs, rubs, gallops or clicks. No pedal edema. Gastrointestinal system: Abdomen is nondistended, soft and nontender. No organomegaly or masses felt. Normal bowel sounds heard. Central nervous system: Alert and oriented. No focal neurological deficits. Extremities: Symmetric 5 x 5 power. Skin: No rashes, lesions or ulcers    Data Reviewed: I have personally reviewed following labs and imaging studies  CBC: Recent Labs  Lab  10/22/17 1923 10/28/17 1015  WBC 7.7 8.0  HGB 13.2 14.1  HCT 40.4 42.6  MCV 91.2 90.1  PLT 330 290   Basic Metabolic Panel: Recent Labs  Lab 10/22/17 1923 10/28/17 1015  NA 142 141  K 4.3 4.0  CL 107 105  CO2 25 26  GLUCOSE 150* 185*  BUN 15 12  CREATININE 1.51* 1.23  CALCIUM 8.7* 9.0   GFR: Estimated Creatinine Clearance: 89.5 mL/min (by C-G formula based on SCr of 1.23 mg/dL). Liver Function Tests: No results for input(s): AST, ALT, ALKPHOS, BILITOT, PROT, ALBUMIN in the last 168 hours. No results for input(s): LIPASE, AMYLASE in the last 168 hours. No results for input(s): AMMONIA in the last 168 hours. Coagulation Profile: No results for input(s): INR, PROTIME in the last 168 hours. Cardiac Enzymes: Recent Labs  Lab 10/28/17 1317 10/28/17 2004 10/29/17 0232 10/29/17 0652  TROPONINI PATIENT IDENTIFICATION ERROR. PLEASE DISREGARD RESULTS. ACCOUNT WILL BE CREDITED. <0.03 <0.03 <0.03   BNP (last 3 results) No results for input(s): PROBNP in the last 8760 hours. HbA1C: Recent Labs    10/28/17 2004  HGBA1C 5.3   CBG: Recent Labs  Lab 10/29/17 0823 10/29/17 1130 10/29/17 1610  GLUCAP 98 93 103*   Lipid Profile: No results for input(s): CHOL, HDL, LDLCALC, TRIG, CHOLHDL, LDLDIRECT in the last 72 hours. Thyroid Function Tests: No results for input(s): TSH, T4TOTAL, FREET4, T3FREE, THYROIDAB in the last 72 hours. Anemia Panel: No results for input(s): VITAMINB12, FOLATE, FERRITIN, TIBC, IRON, RETICCTPCT in the last 72 hours. Sepsis Labs: No results for input(s): PROCALCITON, LATICACIDVEN in the last 168 hours.  No results found for this or any previous visit (from the past 240 hour(s)).       Radiology Studies: Dg Chest 2 View  Result Date: 10/28/2017 CLINICAL DATA:  Chest pain. EXAM: CHEST - 2 VIEW COMPARISON:  10/22/2017. FINDINGS: Mild prominence of superior mediastinum noted. This may be from slight rotation and AP projection. PA and lateral  chest x-ray suggested for further evaluation. Hilar structures normal. Heart size normal. Low lung volumes with mild bibasilar atelectasis. No pleural effusion or pneumothorax. No acute bony abnormality. IMPRESSION: Mild prominence of superior mediastinum noted. This may be from slight rotation and AP projection. PA lateral chest x-ray suggested for further evaluation. 2.  Low lung volumes with mild bibasilar atelectasis. Electronically Signed   By: Maisie Fus  Register   On: 10/28/2017 10:22        Scheduled Meds: . amLODipine  10 mg Oral Daily  . busPIRone  15 mg Oral BID  . divalproex  1,000 mg Oral QHS  . enoxaparin (LOVENOX) injection  40 mg Subcutaneous Q24H  . FLUoxetine  40 mg Oral Daily  . gabapentin  300 mg Oral TID WC & HS  . insulin aspart  0-9 Units Subcutaneous TID WC  . iopamidol      . lamoTRIgine  25 mg Oral BID  . metoprolol tartrate  25 mg Oral Once  . mirtazapine  15 mg Oral QHS  . pantoprazole  40 mg Oral Daily   Continuous Infusions:   LOS: 0 days    Time spent: 35 minutes.     Alba Cory, MD [facility] Pager [phone]  If 7PM-7AM, please contact night-coverage [email or web address] Password [facility] 10/29/2017, 4:46 PM

## 2017-10-29 NOTE — Progress Notes (Addendum)
Progress Note  Patient Name: Barry Daugherty Date of Encounter: 10/29/2017  Primary Cardiologist: Lance Muss, MD  Subjective   Sleeping until awoken this morning.   Inpatient Medications    Scheduled Meds: . amLODipine  10 mg Oral Daily  . busPIRone  15 mg Oral BID  . divalproex  1,000 mg Oral QHS  . enoxaparin (LOVENOX) injection  40 mg Subcutaneous Q24H  . FLUoxetine  40 mg Oral Daily  . gabapentin  300 mg Oral TID WC & HS  . insulin aspart  0-9 Units Subcutaneous TID WC  . iopamidol      . lamoTRIgine  25 mg Oral BID  . mirtazapine  15 mg Oral QHS  . pantoprazole  40 mg Oral Daily   Continuous Infusions:  PRN Meds: acetaminophen, albuterol, cyclobenzaprine, hydrOXYzine, nitroGLYCERIN, ondansetron (ZOFRAN) IV   Vital Signs    Vitals:   10/29/17 0503 10/29/17 0815 10/29/17 1104 10/29/17 1132  BP: 125/77 (!) 142/63 (!) 119/48 117/76  Pulse: 74 77 65 78  Resp: 18 17  16   Temp: 98.1 F (36.7 C) 98.2 F (36.8 C)  98.6 F (37 C)  TempSrc: Oral Oral  Oral  SpO2: 97% 94%  92%  Weight: 221 lb 9.6 oz (100.5 kg)     Height:       No intake or output data in the 24 hours ending 10/29/17 1228 Filed Weights   10/29/17 0503  Weight: 221 lb 9.6 oz (100.5 kg)    Telemetry    SR - Personally Reviewed  ECG    7/16 SR - Personally Reviewed  Physical Exam   General: Well developed, well nourished, male appearing in no acute distress. Head: Normocephalic, atraumatic.  Neck: Supple without bruits, JVD. Lungs:  Resp regular and unlabored, CTA. Heart: RRR, S1, S2, no S3, S4, or murmur; no rub. Abdomen: Soft, non-tender, non-distended with normoactive bowel sounds.  Extremities: No clubbing, cyanosis, edema. Distal pedal pulses are 2+ bilaterally. Neuro: Alert and oriented X 3. Moves all extremities spontaneously. Psych: Normal affect.  Labs    Chemistry Recent Labs  Lab 10/22/17 1923 10/28/17 1015  NA 142 141  K 4.3 4.0  CL 107 105  CO2 25 26    GLUCOSE 150* 185*  BUN 15 12  CREATININE 1.51* 1.23  CALCIUM 8.7* 9.0  GFRNONAA 55* >60  GFRAA >60 >60  ANIONGAP 10 10     Hematology Recent Labs  Lab 10/22/17 1923 10/28/17 1015  WBC 7.7 8.0  RBC 4.43 4.73  HGB 13.2 14.1  HCT 40.4 42.6  MCV 91.2 90.1  MCH 29.8 29.8  MCHC 32.7 33.1  RDW 13.9 13.2  PLT 330 290    Cardiac Enzymes Recent Labs  Lab 10/28/17 1317 10/28/17 2004 10/29/17 0232 10/29/17 0652  TROPONINI 0.04* <0.03 <0.03 <0.03    Recent Labs  Lab 10/22/17 1929 10/22/17 2153 10/28/17 1025  TROPIPOC 0.00 0.00 0.00     BNPNo results for input(s): BNP, PROBNP in the last 168 hours.   DDimer  Recent Labs  Lab 10/28/17 1317  DDIMER 0.38      Radiology    Dg Chest 2 View  Result Date: 10/28/2017 CLINICAL DATA:  Chest pain. EXAM: CHEST - 2 VIEW COMPARISON:  10/22/2017. FINDINGS: Mild prominence of superior mediastinum noted. This may be from slight rotation and AP projection. PA and lateral chest x-ray suggested for further evaluation. Hilar structures normal. Heart size normal. Low lung volumes with mild bibasilar atelectasis. No pleural effusion  or pneumothorax. No acute bony abnormality. IMPRESSION: Mild prominence of superior mediastinum noted. This may be from slight rotation and AP projection. PA lateral chest x-ray suggested for further evaluation. 2.  Low lung volumes with mild bibasilar atelectasis. Electronically Signed   By: Maisie Fus  Register   On: 10/28/2017 10:22    Cardiac Studies   N/a   Patient Profile     47 y.o. male with a hx of DM-2, HTN, bipolar disorder, seizures, and brain injury who presented with chest pain.  Assessment & Plan    1. Chest pain: troponins neg, did have some chest pain last evening. Planned for coronary CT today. Given dose of metoprolol 25mg  this morning for HR. Further recommendations pending results.   2. DM: per admitting  3. Bipolar disorder: was recently in [facility] for the same  4. Hx of seizures:  stable.   Signed, Laverda Page, NP  10/29/2017, 12:28 PM  Pager # [phone]   I have examined the patient and reviewed assessment and plan and discussed with patient.  Agree with above as stated.  Ruled out for MI.  Plan for coronary CT today.    Lance Muss   For questions or updates, please contact [facility] Please consult [email or web address] for contact info under Cardiology/STEMI.

## 2017-10-30 ENCOUNTER — Observation Stay (HOSPITAL_COMMUNITY): Payer: Self-pay

## 2017-10-30 DIAGNOSIS — R072 Precordial pain: Secondary | ICD-10-CM

## 2017-10-30 DIAGNOSIS — R0789 Other chest pain: Secondary | ICD-10-CM

## 2017-10-30 LAB — GLUCOSE, CAPILLARY
Glucose-Capillary: 137 mg/dL — ABNORMAL HIGH (ref 70–99)
Glucose-Capillary: 127 mg/dL — ABNORMAL HIGH (ref 70–99)

## 2017-10-30 MED ORDER — PANTOPRAZOLE SODIUM 40 MG PO TBEC: 40.0000 mg | DELAYED_RELEASE_TABLET | Freq: Two times a day (BID) | ORAL | 0 refills | Status: DC

## 2017-10-30 MED ORDER — ATENOLOL 50 MG PO TABS: 50.0000 mg | ORAL_TABLET | Freq: Every day | ORAL | 0 refills | Status: DC

## 2017-10-30 MED ORDER — NITROGLYCERIN 0.4 MG SL SUBL
SUBLINGUAL_TABLET | SUBLINGUAL | Status: AC
  Filled 2017-10-30: qty 2

## 2017-10-30 MED ORDER — ALBUTEROL SULFATE HFA 108 (90 BASE) MCG/ACT IN AERS: 1.0000 | INHALATION_SPRAY | Freq: Four times a day (QID) | RESPIRATORY_TRACT | 0 refills | Status: DC | PRN

## 2017-10-30 MED ORDER — PANTOPRAZOLE SODIUM 40 MG PO TBEC: 40.0000 mg | DELAYED_RELEASE_TABLET | Freq: Two times a day (BID) | ORAL | Status: DC

## 2017-10-30 MED ORDER — AMLODIPINE BESYLATE 10 MG PO TABS: 10.0000 mg | ORAL_TABLET | Freq: Every day | ORAL | 0 refills | Status: DC

## 2017-10-30 MED ORDER — IOPAMIDOL (ISOVUE-370) INJECTION 76%
100.0000 mL | Freq: Once | INTRAVENOUS | Status: AC
  Administered 2017-10-30: 80 mL via INTRAVENOUS

## 2017-10-30 MED ORDER — ACETAMINOPHEN 325 MG PO TABS: 650.0000 mg | ORAL_TABLET | ORAL | 0 refills | Status: DC | PRN

## 2017-10-30 MED ORDER — IOPAMIDOL (ISOVUE-370) INJECTION 76%
INTRAVENOUS | Status: AC
  Filled 2017-10-30: qty 100

## 2017-10-30 MED ORDER — NITROGLYCERIN 0.4 MG SL SUBL
0.8000 mg | SUBLINGUAL_TABLET | Freq: Once | SUBLINGUAL | Status: AC
  Administered 2017-10-30: 0.8 mg via SUBLINGUAL

## 2017-10-30 NOTE — Clinical Social Work Note (Addendum)
Patient was not officially admitted into [facility] prior to admission. He came to initial appt and then EMS was called for chest pain and shortness of breath. CSW spoke with June at [facility] who stated she has no open spots for scheduled appts but if discharging tomorrow, patient would have to be there by 8:00 am for a walk-in appt. CSW paged MD to notify. She stated they typically have multiple no-shows during summers but cannot guarantee a bed. They are closed on weekends. [facility] will need medications for discharge along with 2+ week refill. Patient will need cab voucher to facility.   Charlynn Court, CSW [phone]  4:09 pm Provided clothes for tonight. Will go to shelter for the night until he can go to [facility] first thing in the morning.  CSW signing off.  Charlynn Court, CSW [phone]

## 2017-10-30 NOTE — Clinical Social Work Note (Signed)
Clinical Social Work Assessment  Patient Details  Name: Barry Daugherty MRN: [id number] Date of Birth: Jul 31, 1970  Date of referral:  10/30/17               Reason for consult:  Substance Use/ETOH Abuse, Discharge Planning                Permission sought to share information with:  Chartered certified accountant granted to share information::  Yes, Verbal Permission Granted  Name::        Agency::  [facility]  Relationship::     Contact Information:     Housing/Transportation Living arrangements for the past 2 months:  ([facility], [facility]) Source of Information:  Patient, Medical Team Patient Interpreter Needed:  None Criminal Activity/Legal Involvement Pertinent to Current Situation/Hospitalization:  No - Comment as needed Significant Relationships:  None Lives with:  Self Do you feel safe going back to the place where you live?  Yes Need for family participation in patient care:  Yes (Comment)  Care giving concerns:  Patient will return to [facility] at discharge.   Social Worker assessment / plan:  CSW met with patient. No supports at bedside. CSW introduced role and inquired about interest in [place] residential substance abuse program. Patient stated he was already there prior to admission and plans to return at discharge. His belongings are already at the facility. Patient gave CSW permission to contact [facility] and see if they needed any documentation once discharged. Patient unsure of how he will get to the facility once discharged. CSW left voicemail for intake coordinator. No further concerns. CSW encouraged patient to contact CSW as needed. CSW will continue to follow patient for support and facilitate discharge to [facility] once medically stable.  Employment status:  Unemployed Forensic scientist:  Self Pay (Medicaid Pending) PT Recommendations:  Not assessed at this time Information / Referral to community resources:  Residential Substance Abuse Treatment  Options  Patient/Family's Response to care:  Patient agreeable to returning to [facility] at discharge. Patient appears to have no support system. Patient appreciated social work intervention.  Patient/Family's Understanding of and Emotional Response to Diagnosis, Current Treatment, and Prognosis:  Patient has a good understanding of the reason for admission and social work consult. Patient appears happy with hospital care.  Emotional Assessment Appearance:  Appears stated age Attitude/Demeanor/Rapport:  Engaged, Gracious Affect (typically observed):  Accepting, Appropriate, Calm, Pleasant Orientation:  Oriented to Self, Oriented to Place, Oriented to  Time, Oriented to Situation Alcohol / Substance use:  Illicit Drugs Psych involvement (Current and /or in the community):  No (Comment)  Discharge Needs  Concerns to be addressed:  Care Coordination Readmission within the last 30 days:  Yes Current discharge risk:  Substance Abuse Barriers to Discharge:  Continued Medical Work up   Candie Chroman, LCSW 10/30/2017, 11:49 AM

## 2017-10-30 NOTE — Plan of Care (Signed)
  Problem: Education: Goal: Knowledge of General Education information will improve Outcome: Progressing   Problem: Activity: Goal: Risk for activity intolerance will decrease Outcome: Progressing   Problem: Coping: Goal: Level of anxiety will decrease Outcome: Progressing

## 2017-10-30 NOTE — Progress Notes (Signed)
Pt has orders to be discharged. Discharge instructions given and pt has no additional questions at this time. Medication regimen reviewed and pt educated. Pt verbalized understanding and has no additional questions. Telemetry box removed. IV removed and site in good condition. Pt stable and waiting for transportation. 

## 2017-10-30 NOTE — Discharge Summary (Signed)
Physician Discharge Summary  Barry Daugherty UJW:[id number] DOB: Nov 18, 1970 DOA: 10/28/2017  PCP: Patient, No Pcp Per  Admit date: 10/28/2017 Discharge date: 10/30/2017  Admitted From: Home  Disposition[facility], [facility]   Recommendations for Outpatient Follow-up:  1. Follow up with PCP in 1-2 weeks 2. Please obtain BMP/CBC in one week     Discharge Condition: Stable.  CODE STATUS: full code.  Diet recommendation: Heart Healthy   Brief/Interim Summary: Brief Narrative: Barry Heffneris a 47 y.o.malewith medical history significant ofmajor depressive disorder, bipolar disorder, polysubstance abuse, hypertension, diabetes mellitus, who presents to the hospital with chief complaint of chest pain. He states that she has pain started about 6 hours ago roughly at 10 AM this morning, feels like a pressure in the middle of his chest radiating into his left shoulder and left arm. He states that this pain reminds me of his previous heart attacks. 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 He gets part of his care in [place] as well as [place], occasional lives in [place]. He reports that he was [facility] up until yesterday for suicidal ideation, was discharged and he feels much better. Currently denies depressed mood or suicidal or homicidal ideation. 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 He denies any alcohol or tobacco use. He moved to [place] in the past few years to be with family, however family "did not accept" him and he moved back to [place] about a month ago. With his chest pain he also has been having nausea, denies any vomiting. He is also been complaining of "bandlike pain" in his lower abdomen. Denies any fever or chills.  ED Course:In the emergency room he is afebrile, blood pressure is stable, he is satting 96% on room air. His EKG is fairly unremarkable with very  mild nonspecific ST segment changes. His blood work shows a glucose of 185 but otherwise he has normal renal function and CBC is normal. His d-dimer is 0.38. Initial troponin was negative, however on repeat was 0.04. His urine drug screen is negative. Cardiology was consulted who recommended overnight admission for obtaining serial cardiac enzymes, n.p.o. after midnight for potential stress test versus cardiac catheterization should his troponins increased significantly.     Assessment & Plan:   Active Problems:   Chest pain  1-Chest pain;  CT coronary, calcium score o. Normal coronary artery.  D dimer 0.38 Troponin negative.  suspect GI, esophagitis, change protonix to BID vs muscle skeletal pain.  Stable for discharge.   2-DM;  SSI Hb A1c 5.5.  Resume metformin.   3-Depressive disorder/bipolar/recent hospitalization with suicidal ideation - recently discharged from behavioral health on 7/15. Continue home medications -SW will help arrange [facility] admission.   4-Polysubstanceabuse -Per patient has been clean for 7 months   HTN; on Norvasc.       Discharge Diagnoses:  Active Problems:   Chest pain    Discharge Instructions  Discharge Instructions    Diet - low sodium heart healthy   Complete by:  As directed    Increase activity slowly   Complete by:  As directed      Allergies as of 10/30/2017      Reactions   Ceftin [cefuroxime Axetil] Hives   Reglan [metoclopramide] Itching   Penicillins Hives   Has patient had a PCN reaction causing immediate rash, facial/tongue/throat swelling, SOB or lightheadedness with hypotension: Yes Has  patient had a PCN reaction causing severe rash involving mucus membranes or skin necrosis: No Has patient had a PCN reaction that required hospitalization: No Has patient had a PCN reaction occurring within the last 10 years: No If all of the above answers are "NO", then may proceed with Cephalosporin use.       Medication List    TAKE these medications   acetaminophen 325 MG tablet Commonly known as:  TYLENOL Take 2 tablets (650 mg total) by mouth every 4 (four) hours as needed for headache or mild pain.   albuterol 108 (90 Base) MCG/ACT inhaler Commonly known as:  PROVENTIL HFA;VENTOLIN HFA Inhale 1-2 puffs into the lungs every 6 (six) hours as needed for wheezing or shortness of breath.   amLODipine 10 MG tablet Commonly known as:  NORVASC Take 1 tablet (10 mg total) by mouth daily. For high blood pressure   atenolol 50 MG tablet Commonly known as:  TENORMIN Take 1 tablet (50 mg total) by mouth daily. For high blood pressure   busPIRone 15 MG tablet Commonly known as:  BUSPAR Take 1 tablet (15 mg total) by mouth 2 (two) times daily. For anxiety   cyclobenzaprine 5 MG tablet Commonly known as:  FLEXERIL Take 1 tablet (5 mg total) by mouth 3 (three) times daily as needed for muscle spasms.   divalproex 500 MG 24 hr tablet Commonly known as:  DEPAKOTE ER Take 2 tablets (1,000 mg total) by mouth at bedtime. For mood stabilization   FLUoxetine 40 MG capsule Commonly known as:  PROZAC Take 1 capsule (40 mg total) by mouth daily. For depression   gabapentin 300 MG capsule Commonly known as:  NEURONTIN Take 1 capsule (300 mg total) by mouth 4 (four) times daily -  with meals and at bedtime. For agitation   hydrOXYzine 25 MG tablet Commonly known as:  ATARAX/VISTARIL Take 1 tablet (25 mg total) by mouth 3 (three) times daily as needed for anxiety.   lamoTRIgine 25 MG tablet Commonly known as:  LAMICTAL Take 1 tablet (25 mg total) by mouth 2 (two) times daily. For mood stabilization   METFORMIN HCL PO Take 1 tablet by mouth daily.   mirtazapine 15 MG tablet Commonly known as:  REMERON Take 1 tablet (15 mg total) by mouth at bedtime. For depression/sleep   pantoprazole 40 MG tablet Commonly known as:  PROTONIX Take 1 tablet (40 mg total) by mouth 2 (two) times daily. What  changed:    when to take this  additional instructions       Allergies  Allergen Reactions  . Ceftin [Cefuroxime Axetil] Hives  . Reglan [Metoclopramide] Itching  . Penicillins Hives    Has patient had a PCN reaction causing immediate rash, facial/tongue/throat swelling, SOB or lightheadedness with hypotension: Yes Has patient had a PCN reaction causing severe rash involving mucus membranes or skin necrosis: No Has patient had a PCN reaction that required hospitalization: No Has patient had a PCN reaction occurring within the last 10 years: No If all of the above answers are "NO", then may proceed with Cephalosporin use.     Consultations:  Cardiology    Procedures/Studies: Dg Chest 2 View  Result Date: 10/28/2017 CLINICAL DATA:  Chest pain. EXAM: CHEST - 2 VIEW COMPARISON:  10/22/2017. FINDINGS: Mild prominence of superior mediastinum noted. This may be from slight rotation and AP projection. PA and lateral chest x-ray suggested for further evaluation. Hilar structures normal. Heart size normal. Low lung volumes with mild  bibasilar atelectasis. No pleural effusion or pneumothorax. No acute bony abnormality. IMPRESSION: Mild prominence of superior mediastinum noted. This may be from slight rotation and AP projection. PA lateral chest x-ray suggested for further evaluation. 2.  Low lung volumes with mild bibasilar atelectasis. Electronically Signed   By: Maisie Fus  Register   On: 10/28/2017 10:22   Dg Chest 2 View  Result Date: 10/22/2017 CLINICAL DATA:  Chest pain, shortness of breath. EXAM: CHEST - 2 VIEW COMPARISON:  None. FINDINGS: The heart size and mediastinal contours are within normal limits. Both lungs are clear. No pneumothorax or pleural effusion is noted. The visualized skeletal structures are unremarkable. IMPRESSION: No active cardiopulmonary disease. Electronically Signed   By: Lupita Raider, M.D.   On: 10/22/2017 20:14   Ct Coronary Morph W/cta Cor W/score W/ca W/cm  &/or Wo/cm  Addendum Date: 10/30/2017   ADDENDUM REPORT: 10/30/2017 14:12 CLINICAL DATA:  47 year old male with atypical chest pain and h/o etoh abuse. EXAM: Cardiac/Coronary  CT TECHNIQUE: The patient was scanned on a [employer]. FINDINGS: A 120 kV prospective scan was triggered in the descending thoracic aorta at 111 HU's. Axial non-contrast 3 mm slices were carried out through the heart. The data set was analyzed on a dedicated work station and scored using the Agatson method. Gantry rotation speed was 250 msecs and collimation was .6 mm. No beta blockade and 0.8 mg of sl NTG was given. The 3D data set was reconstructed in 5% intervals of the 67-82 % of the R-R cycle. Diastolic phases were analyzed on a dedicated work station using MPR, MIP and VRT modes. The patient received 80 cc of contrast. Aorta:  Normal size.  No calcifications.  No dissection. Aortic Valve:  Trileaflet.  No calcifications. Coronary Arteries:  Normal coronary origin.  Right dominance. RCA is a large dominant artery that gives rise to PDA and PLA. There is no plaque. Left main is a large artery that gives rise to LAD, ramus intermedius and LCX arteries. LAD is a large vessel that has no plaque. RI is a large artery that has no plaque. LCX is a small non-dominant artery that has no plaque. Other findings: Normal pulmonary vein drainage into the left atrium. Normal let atrial appendage without a thrombus. Normal size of the pulmonary artery. IMPRESSION: 1. Coronary calcium score of 0. This was 0 percentile for age and sex matched control. 2. Normal coronary origin with right dominance. 3. No evidence of CAD.  Consider non-cardiac causes of chest pain. Electronically Signed   By: Tobias Alexander   On: 10/30/2017 14:12   Result Date: 10/30/2017 EXAM: OVER-READ INTERPRETATION  CT CHEST The following report is an over-read performed by radiologist Dr. Trudie Reed of [facility] on 10/30/2017. This over-read does  not include interpretation of cardiac or coronary anatomy or pathology. The coronary calcium score/coronary CTA interpretation by the cardiologist is attached. COMPARISON:  None. FINDINGS: Within the visualized portions of the thorax there are no suspicious appearing pulmonary nodules or masses, there is no acute consolidative airspace disease, no pleural effusions, no pneumothorax and no lymphadenopathy. Visualized portions of the upper abdomen demonstrates mild diffuse low attenuation throughout the visualized hepatic parenchyma, indicative of hepatic steatosis. There are no aggressive appearing lytic or blastic lesions noted in the visualized portions of the skeleton. IMPRESSION: 1. Hepatic steatosis. Electronically Signed: By: Trudie Reed M.D. On: 10/30/2017 11:04      Subjective: Chest pain free this afternoon.   Discharge Exam: Vitals:  10/30/17 1202 10/30/17 1453  BP: 114/75 120/74  Pulse: 64 64  Resp: 20   Temp: 97.9 F (36.6 C)   SpO2: 95%    Vitals:   10/30/17 1055 10/30/17 1114 10/30/17 1202 10/30/17 1453  BP: 113/69 107/72 114/75 120/74  Pulse: 70 72 64 64  Resp:  14 20   Temp:  98.4 F (36.9 C) 97.9 F (36.6 C)   TempSrc:  Oral Oral   SpO2:  94% 95%   Weight:      Height:        General: Pt is alert, awake, not in acute distress Cardiovascular: RRR, S1/S2 +, no rubs, no gallops Respiratory: CTA bilaterally, no wheezing, no rhonchi Abdominal: Soft, NT, ND, bowel sounds + Extremities: no edema, no cyanosis    The results of significant diagnostics from this hospitalization (including imaging, microbiology, ancillary and laboratory) are listed below for reference.     Microbiology: No results found for this or any previous visit (from the past 240 hour(s)).   Labs: BNP (last 3 results) No results for input(s): BNP in the last 8760 hours. Basic Metabolic Panel: Recent Labs  Lab 10/28/17 1015  NA 141  K 4.0  CL 105  CO2 26  GLUCOSE 185*  BUN 12   CREATININE 1.23  CALCIUM 9.0   Liver Function Tests: No results for input(s): AST, ALT, ALKPHOS, BILITOT, PROT, ALBUMIN in the last 168 hours. No results for input(s): LIPASE, AMYLASE in the last 168 hours. No results for input(s): AMMONIA in the last 168 hours. CBC: Recent Labs  Lab 10/28/17 1015  WBC 8.0  HGB 14.1  HCT 42.6  MCV 90.1  PLT 290   Cardiac Enzymes: Recent Labs  Lab 10/28/17 1317 10/28/17 2004 10/29/17 0232 10/29/17 0652  TROPONINI PATIENT IDENTIFICATION ERROR. PLEASE DISREGARD RESULTS. ACCOUNT WILL BE CREDITED. <0.03 <0.03 <0.03   BNP: Invalid input(s): POCBNP CBG: Recent Labs  Lab 10/29/17 1130 10/29/17 1610 10/29/17 2221 10/30/17 0752 10/30/17 1158  GLUCAP 93 103* 108* 137* 127*   D-Dimer Recent Labs    10/28/17 1317  DDIMER 0.38   Hgb A1c Recent Labs    10/28/17 2004  HGBA1C 5.3   Lipid Profile No results for input(s): CHOL, HDL, LDLCALC, TRIG, CHOLHDL, LDLDIRECT in the last 72 hours. Thyroid function studies No results for input(s): TSH, T4TOTAL, T3FREE, THYROIDAB in the last 72 hours.  Invalid input(s): FREET3 Anemia work up No results for input(s): VITAMINB12, FOLATE, FERRITIN, TIBC, IRON, RETICCTPCT in the last 72 hours. Urinalysis    Component Value Date/Time   COLORURINE YELLOW 05/22/2016 2034   APPEARANCEUR HAZY (A) 05/22/2016 2034   LABSPEC 1.028 05/22/2016 2034   PHURINE 5.0 05/22/2016 2034   GLUCOSEU NEGATIVE 05/22/2016 2034   HGBUR NEGATIVE 05/22/2016 2034   BILIRUBINUR NEGATIVE 05/22/2016 2034   KETONESUR NEGATIVE 05/22/2016 2034   PROTEINUR NEGATIVE 05/22/2016 2034   NITRITE NEGATIVE 05/22/2016 2034   LEUKOCYTESUR NEGATIVE 05/22/2016 2034   Sepsis Labs Invalid input(s): PROCALCITONIN,  WBC,  LACTICIDVEN Microbiology No results found for this or any previous visit (from the past 240 hour(s)).   Time coordinating discharge: 35 minutes.   SIGNED:   Alba Cory, MD  [facility] 10/30/2017,  3:01 PM Pager   If 7PM-7AM, please contact night-coverage [email or web address] Password [email or web address]

## 2017-10-30 NOTE — Progress Notes (Signed)
Progress Note  Patient Name: Barry Daugherty Date of Encounter: 10/30/2017  Primary Cardiologist: Lance Muss, MD   Subjective   Patient has had some recurrent chest pain  Inpatient Medications    Scheduled Meds: . amLODipine  10 mg Oral Daily  . busPIRone  15 mg Oral BID  . divalproex  1,000 mg Oral QHS  . enoxaparin (LOVENOX) injection  40 mg Subcutaneous Q24H  . FLUoxetine  40 mg Oral Daily  . gabapentin  300 mg Oral TID WC & HS  . insulin aspart  0-9 Units Subcutaneous TID WC  . lamoTRIgine  25 mg Oral BID  . metoprolol tartrate  50 mg Oral BID  . mirtazapine  15 mg Oral QHS  . pantoprazole  40 mg Oral Daily   Continuous Infusions:  PRN Meds: acetaminophen, albuterol, cyclobenzaprine, hydrOXYzine, nitroGLYCERIN, ondansetron (ZOFRAN) IV   Vital Signs    Vitals:   10/30/17 0021 10/30/17 0424 10/30/17 0829 10/30/17 0855  BP: (!) 111/59 113/78 122/79   Pulse: 61 67 71 72  Resp: 18 16    Temp: 97.6 F (36.4 C) 98.2 F (36.8 C)  98.4 F (36.9 C)  TempSrc: Oral Oral  Oral  SpO2: 93% 93%  94%  Weight:  221 lb (100.2 kg)    Height:        Intake/Output Summary (Last 24 hours) at 10/30/2017 1008 Last data filed at 10/30/2017 0830 Gross per 24 hour  Intake 1196 ml  Output 0 ml  Net 1196 ml   Filed Weights   10/29/17 0503 10/30/17 0424  Weight: 221 lb 9.6 oz (100.5 kg) 221 lb (100.2 kg)    Telemetry    NSR - Personally Reviewed  ECG      Physical Exam   GEN: No acute distress.   Neck: No JVD Cardiac: RRR, no murmurs, rubs, or gallops.  Respiratory: Clear to auscultation bilaterally. GI: Soft, nontender, non-distended  MS: No edema; No deformity. Neuro:  Nonfocal  Psych: Normal affect   Labs    Chemistry Recent Labs  Lab 10/28/17 1015  NA 141  K 4.0  CL 105  CO2 26  GLUCOSE 185*  BUN 12  CREATININE 1.23  CALCIUM 9.0  GFRNONAA >60  GFRAA >60  ANIONGAP 10     Hematology Recent Labs  Lab 10/28/17 1015  WBC 8.0  RBC 4.73    HGB 14.1  HCT 42.6  MCV 90.1  MCH 29.8  MCHC 33.1  RDW 13.2  PLT 290    Cardiac Enzymes Recent Labs  Lab 10/28/17 1317 10/28/17 2004 10/29/17 0232 10/29/17 0652  TROPONINI PATIENT IDENTIFICATION ERROR. PLEASE DISREGARD RESULTS. ACCOUNT WILL BE CREDITED. <0.03 <0.03 <0.03    Recent Labs  Lab 10/28/17 1025  TROPIPOC 0.00     BNPNo results for input(s): BNP, PROBNP in the last 168 hours.   DDimer  Recent Labs  Lab 10/28/17 1317  DDIMER 0.38     Radiology    Dg Chest 2 View  Result Date: 10/28/2017 CLINICAL DATA:  Chest pain. EXAM: CHEST - 2 VIEW COMPARISON:  10/22/2017. FINDINGS: Mild prominence of superior mediastinum noted. This may be from slight rotation and AP projection. PA and lateral chest x-ray suggested for further evaluation. Hilar structures normal. Heart size normal. Low lung volumes with mild bibasilar atelectasis. No pleural effusion or pneumothorax. No acute bony abnormality. IMPRESSION: Mild prominence of superior mediastinum noted. This may be from slight rotation and AP projection. PA lateral chest x-ray suggested for further  evaluation. 2.  Low lung volumes with mild bibasilar atelectasis. Electronically Signed   By: Maisie Fus  Register   On: 10/28/2017 10:22    Cardiac Studies     Patient Profile     47 y.o. male with some atypical chest pain  Assessment & Plan    Plan for cardiac CT.  Metoprolol to keep HR slower.   RF modification including lipid control, healthy diet and avoiding tobacco.     For questions or updates, please contact [facility] Please consult [email or web address] for contact info under Cardiology/STEMI.      Signed, Lance Muss, MD  10/30/2017, 10:08 AM

## 2017-10-30 NOTE — Progress Notes (Signed)
Pt states he has been NPO since midnight per MD   No orders reflecting NPO  Pt not on schedule for procedure at this time  Paged NP regarding NP stated pt needs to be NPO for 4hours prior to CT and to give all morning medications

## 2017-10-31 ENCOUNTER — Emergency Department (HOSPITAL_COMMUNITY)
Admission: EM | Admit: 2017-10-31 | Discharge: 2017-10-31 | Disposition: A | Discharge status: Home / Self Care | Payer: Self-pay | Attending: Emergency Medicine | Admitting: Emergency Medicine

## 2017-10-31 ENCOUNTER — Emergency Department (HOSPITAL_COMMUNITY): Payer: Self-pay

## 2017-10-31 ENCOUNTER — Other Ambulatory Visit: Payer: Self-pay

## 2017-10-31 ENCOUNTER — Emergency Department (HOSPITAL_COMMUNITY)
Admission: EM | Admit: 2017-10-31 | Discharge: 2017-11-01 | Disposition: A | Discharge status: Home / Self Care | Payer: Self-pay | Attending: Emergency Medicine | Admitting: Emergency Medicine

## 2017-10-31 ENCOUNTER — Encounter (HOSPITAL_COMMUNITY): Payer: Self-pay | Admitting: Emergency Medicine

## 2017-10-31 DIAGNOSIS — R1084 Generalized abdominal pain: Secondary | ICD-10-CM

## 2017-10-31 DIAGNOSIS — R45851 Suicidal ideations: Secondary | ICD-10-CM

## 2017-10-31 DIAGNOSIS — I129 Hypertensive chronic kidney disease with stage 1 through stage 4 chronic kidney disease, or unspecified chronic kidney disease: Secondary | ICD-10-CM | POA: Insufficient documentation

## 2017-10-31 DIAGNOSIS — E1022 Type 1 diabetes mellitus with diabetic chronic kidney disease: Secondary | ICD-10-CM | POA: Insufficient documentation

## 2017-10-31 DIAGNOSIS — F4323 Adjustment disorder with mixed anxiety and depressed mood: Secondary | ICD-10-CM | POA: Diagnosis present

## 2017-10-31 DIAGNOSIS — J45909 Unspecified asthma, uncomplicated: Secondary | ICD-10-CM | POA: Insufficient documentation

## 2017-10-31 DIAGNOSIS — Z79899 Other long term (current) drug therapy: Secondary | ICD-10-CM | POA: Insufficient documentation

## 2017-10-31 DIAGNOSIS — I252 Old myocardial infarction: Secondary | ICD-10-CM | POA: Insufficient documentation

## 2017-10-31 DIAGNOSIS — Z87891 Personal history of nicotine dependence: Secondary | ICD-10-CM | POA: Insufficient documentation

## 2017-10-31 DIAGNOSIS — Z8673 Personal history of transient ischemic attack (TIA), and cerebral infarction without residual deficits: Secondary | ICD-10-CM | POA: Insufficient documentation

## 2017-10-31 DIAGNOSIS — Z905 Acquired absence of kidney: Secondary | ICD-10-CM | POA: Insufficient documentation

## 2017-10-31 DIAGNOSIS — F332 Major depressive disorder, recurrent severe without psychotic features: Secondary | ICD-10-CM | POA: Insufficient documentation

## 2017-10-31 DIAGNOSIS — N189 Chronic kidney disease, unspecified: Secondary | ICD-10-CM | POA: Insufficient documentation

## 2017-10-31 DIAGNOSIS — R079 Chest pain, unspecified: Secondary | ICD-10-CM

## 2017-10-31 DIAGNOSIS — R1032 Left lower quadrant pain: Secondary | ICD-10-CM | POA: Insufficient documentation

## 2017-10-31 LAB — LIPASE, BLOOD: LIPASE: 37 U/L (ref 11–51)

## 2017-10-31 LAB — URINALYSIS, ROUTINE W REFLEX MICROSCOPIC
Bilirubin Urine: NEGATIVE
Glucose, UA: NEGATIVE mg/dL
Hgb urine dipstick: NEGATIVE
KETONES UR: NEGATIVE mg/dL
LEUKOCYTES UA: NEGATIVE
NITRITE: NEGATIVE
PROTEIN: NEGATIVE mg/dL
Specific Gravity, Urine: 1.035 — ABNORMAL HIGH (ref 1.005–1.030)
pH: 5 (ref 5.0–8.0)

## 2017-10-31 LAB — RAPID URINE DRUG SCREEN, HOSP PERFORMED
Amphetamines: NOT DETECTED
BENZODIAZEPINES: NOT DETECTED
Barbiturates: POSITIVE — AB
Cocaine: NOT DETECTED
Opiates: NOT DETECTED
Tetrahydrocannabinol: NOT DETECTED

## 2017-10-31 LAB — COMPREHENSIVE METABOLIC PANEL
ALT: 29 U/L (ref 0–44)
AST: 30 U/L (ref 15–41)
Albumin: 3.6 g/dL (ref 3.5–5.0)
Alkaline Phosphatase: 57 U/L (ref 38–126)
Anion gap: 12 (ref 5–15)
BILIRUBIN TOTAL: 0.6 mg/dL (ref 0.3–1.2)
BUN: 18 mg/dL (ref 6–20)
CALCIUM: 9.2 mg/dL (ref 8.9–10.3)
CO2: 23 mmol/L (ref 22–32)
CREATININE: 1.39 mg/dL — AB (ref 0.61–1.24)
Chloride: 108 mmol/L (ref 98–111)
Glucose, Bld: 205 mg/dL — ABNORMAL HIGH (ref 70–99)
Potassium: 4 mmol/L (ref 3.5–5.1)
Sodium: 143 mmol/L (ref 135–145)
TOTAL PROTEIN: 6.3 g/dL — AB (ref 6.5–8.1)

## 2017-10-31 LAB — CBC
HCT: 44 % (ref 39.0–52.0)
Hemoglobin: 14.3 g/dL (ref 13.0–17.0)
MCH: 29.5 pg (ref 26.0–34.0)
MCHC: 32.5 g/dL (ref 30.0–36.0)
MCV: 90.7 fL (ref 78.0–100.0)
PLATELETS: 319 10*3/uL (ref 150–400)
RBC: 4.85 MIL/uL (ref 4.22–5.81)
RDW: 13.4 % (ref 11.5–15.5)
WBC: 7.5 10*3/uL (ref 4.0–10.5)

## 2017-10-31 LAB — CBG MONITORING, ED: GLUCOSE-CAPILLARY: 149 mg/dL — AB (ref 70–99)

## 2017-10-31 LAB — SALICYLATE LEVEL: Salicylate Lvl: <7 mg/dL (ref 2.8–30.0)

## 2017-10-31 LAB — ACETAMINOPHEN LEVEL

## 2017-10-31 LAB — ETHANOL

## 2017-10-31 MED ORDER — IOHEXOL 300 MG/ML  SOLN
100.0000 mL | Freq: Once | INTRAMUSCULAR | Status: AC | PRN
  Administered 2017-10-31: 100 mL via INTRAVENOUS

## 2017-10-31 MED ORDER — SODIUM CHLORIDE 0.9 % IV BOLUS
1000.0000 mL | Freq: Once | INTRAVENOUS | Status: AC
  Administered 2017-11-01: 1000 mL via INTRAVENOUS

## 2017-10-31 MED ORDER — SODIUM CHLORIDE 0.9 % IV BOLUS
1000.0000 mL | Freq: Once | INTRAVENOUS | Status: AC
  Administered 2017-10-31: 1000 mL via INTRAVENOUS

## 2017-10-31 NOTE — ED Notes (Signed)
Pt returned to room from CT scan

## 2017-10-31 NOTE — ED Triage Notes (Signed)
Pt c/o LUQ pain and constipation x 3 days.

## 2017-10-31 NOTE — Discharge Instructions (Addendum)
Return here as needed. Follow up with your doctor. °

## 2017-10-31 NOTE — ED Triage Notes (Signed)
Pt is homeless brought to ED by [facility] for having "nervous break down"

## 2017-10-31 NOTE — ED Provider Notes (Addendum)
[facility]-EMERGENCY DEPT Provider Note   CSN: [id number] Arrival date & time: 10/31/17  2032     History   Chief Complaint Chief Complaint  Patient presents with  . Medical Clearance    HPI Barry Daugherty is a 47 y.o. male with a hx of homelessness, polysubstance abuse, asthma, anxiety, CKD, depression, seizures, HTN, MI, CVA, NIDDM, migraine headache presents to the Emergency Department complaining of gradual, persistent, progressively worsening suicidal ideation onset several days ago with a plan to overdose on pills.  Pt also c/o chest pain, abd pain and dysuria constant for the last 5 days.  Pt reports he has been off all his medications for the last 5 days because he is "forgetting to take them."  Pt reports that 1 mo ago he traveled to [place] to reconnect but this was unsuccessful and he has been homeless since that time.  Pt reports OD 1 mo ago with several suicide attempts in the past.  Pt denies nausea, vomiting, diaphoresis.  Pt reports healthcare in [place], [place] and [place].    Record review shows pt was evaluated this morning for generalized abd pain.  He had a negative CT scan, normal labs and was discharged home.  Pt was discharged on 10/30/17 after overnight obs for chest pain without evidence of ACS. CT coronary at this visit was normal without evidence of CAD.  He was evaluated by cardiology during this hospitalization.  Prior to that he was hospitalized at [facility] for suicidal ideation and no documented OD at this visit, discharged on 7/16.  NO mention of SI on the last 2 ED visits.  Notes from 09/11/17 when pt was hospitalized in [place] for SI do not mention OD.     The history is provided by the patient and medical records. No language interpreter was used.    Past Medical History:  Diagnosis Date  . Anxiety   . Asthma   . Bipolar 1 disorder (HCC)   . Brain injury (HCC)   . Chronic kidney disease    nephrectomy 1975  . Depression   . Grand mal seizure  [facility])    "controlled w/RX" (10/28/2017)  . Headache    "couple/week" (10/28/2017)  . High cholesterol   . Hypertension   . Migraine    "couple/year" (10/28/2017)  . Myocardial infarction (HCC) 04/2017  . Sleep apnea    "suppose to have CPAP; I don't have one" (10/28/2017)  . Stroke [facility]) 03/2017   "left side is weaker since" (10/28/2017)  . Type I diabetes mellitus (HCC)    "dx'd 2005; can't afford insulin so I use pills" (10/28/2017)    Patient Active Problem List   Diagnosis Date Noted  . Chest pain 10/28/2017  . MDD (major depressive disorder), recurrent severe, without psychosis (HCC) 10/15/2017  . MDD (major depressive disorder) 04/12/2015  . Substance induced mood disorder (HCC) 04/04/2015  . Severe episode of recurrent major depressive disorder, without psychotic features (HCC)   . GAD (generalized anxiety disorder)     Past Surgical History:  Procedure Laterality Date  . CHOLECYSTECTOMY OPEN    . NEPHRECTOMY Left 1975        Home Medications    Prior to Admission medications   Medication Sig Start Date End Date Taking? Authorizing Provider  acetaminophen (TYLENOL) 325 MG tablet Take 2 tablets (650 mg total) by mouth every 4 (four) hours as needed for headache or mild pain. 10/30/17  Yes Regalado, Belkys A, MD  albuterol (PROVENTIL HFA;VENTOLIN  HFA) 108 (90 Base) MCG/ACT inhaler Inhale 1-2 puffs into the lungs every 6 (six) hours as needed for wheezing or shortness of breath. 10/30/17  Yes Regalado, Belkys A, MD  amLODipine (NORVASC) 10 MG tablet Take 1 tablet (10 mg total) by mouth daily. For high blood pressure 10/30/17  Yes Regalado, Belkys A, MD  atenolol (TENORMIN) 50 MG tablet Take 1 tablet (50 mg total) by mouth daily. For high blood pressure 10/30/17  Yes Regalado, Belkys A, MD  busPIRone (BUSPAR) 15 MG tablet Take 1 tablet (15 mg total) by mouth 2 (two) times daily. For anxiety 10/27/17  Yes Nwoko, Nicole Kindred I, NP  cyclobenzaprine (FLEXERIL) 5 MG tablet Take 1 tablet (5  mg total) by mouth 3 (three) times daily as needed for muscle spasms. 10/27/17  Yes Armandina Stammer I, NP  divalproex (DEPAKOTE ER) 500 MG 24 hr tablet Take 2 tablets (1,000 mg total) by mouth at bedtime. For mood stabilization 10/27/17  Yes Nwoko, Nicole Kindred I, NP  FLUoxetine (PROZAC) 40 MG capsule Take 1 capsule (40 mg total) by mouth daily. For depression 10/28/17  Yes Armandina Stammer I, NP  gabapentin (NEURONTIN) 300 MG capsule Take 1 capsule (300 mg total) by mouth 4 (four) times daily -  with meals and at bedtime. For agitation 10/27/17  Yes Armandina Stammer I, NP  hydrOXYzine (ATARAX/VISTARIL) 25 MG tablet Take 1 tablet (25 mg total) by mouth 3 (three) times daily as needed for anxiety. 10/27/17  Yes Armandina Stammer I, NP  lamoTRIgine (LAMICTAL) 25 MG tablet Take 1 tablet (25 mg total) by mouth 2 (two) times daily. For mood stabilization 10/27/17  Yes Nwoko, Nicole Kindred I, NP  METFORMIN HCL PO Take 1 tablet by mouth daily.   Yes [provider]  mirtazapine (REMERON) 15 MG tablet Take 1 tablet (15 mg total) by mouth at bedtime. For depression/sleep 10/27/17  Yes Armandina Stammer I, NP  pantoprazole (PROTONIX) 40 MG tablet Take 1 tablet (40 mg total) by mouth 2 (two) times daily. 10/30/17  Yes Regalado, Prentiss Bells, MD    Family History Family History  Problem Relation Age of Onset  . Healthy Mother   . Heart attack Father 59  . Pancreatic cancer Father   . Healthy Sister   . Healthy Brother     Social History Social History   Tobacco Use  . Smoking status: Former Smoker    Packs/day: 0.50    Years: 29.00    Pack years: 14.50    Types: Cigarettes    Last attempt to quit: 04/15/2017    Years since quitting: 0.5  . Smokeless tobacco: Never Used  Substance Use Topics  . Alcohol use: No    Comment: 10/28/2017 "nothing to drink since 2005; I'm at [facility] now"  . Drug use: Yes    Types: Cocaine, Marijuana, Methamphetamines    Comment: 10/28/2017 "clean for 6 months now"     Allergies   Ceftin  [cefuroxime axetil]; Reglan [metoclopramide]; and Penicillins   Review of Systems Review of Systems  Constitutional: Negative for appetite change, diaphoresis, fatigue, fever and unexpected weight change.  HENT: Negative for mouth sores.   Eyes: Negative for visual disturbance.  Respiratory: Positive for shortness of breath. Negative for cough, chest tightness and wheezing.   Cardiovascular: Positive for chest pain.  Gastrointestinal: Positive for abdominal pain. Negative for constipation, diarrhea, nausea and vomiting.  Endocrine: Negative for polydipsia, polyphagia and polyuria.  Genitourinary: Positive for dysuria. Negative for frequency, hematuria and urgency.  Musculoskeletal:  Negative for back pain and neck stiffness.  Skin: Negative for rash.  Allergic/Immunologic: Negative for immunocompromised state.  Neurological: Negative for syncope, light-headedness and headaches.  Hematological: Does not bruise/bleed easily.  Psychiatric/Behavioral: Positive for suicidal ideas. Negative for sleep disturbance. The patient is nervous/anxious.      Physical Exam Updated Vital Signs BP (!) 148/89 (BP Location: Right Arm)   Pulse (!) 108   Temp 98.2 F (36.8 C) (Oral)   Resp 16   SpO2 95%   Physical Exam  Constitutional: He appears well-developed and well-nourished. No distress.  Awake, alert, nontoxic appearance  HENT:  Head: Normocephalic and atraumatic.  Mouth/Throat: Oropharynx is clear and moist. No oropharyngeal exudate.  Eyes: Conjunctivae are normal. No scleral icterus.  Neck: Normal range of motion. Neck supple.  Cardiovascular: Regular rhythm and intact distal pulses. Tachycardia present.  Pulses:      Radial pulses are 2+ on the right side, and 2+ on the left side.  Pulmonary/Chest: Effort normal and breath sounds normal. No respiratory distress. He has no wheezes. He exhibits tenderness (anterior chest).  Equal chest expansion  Abdominal: Soft. Bowel sounds are normal.  He exhibits no mass. There is generalized tenderness. There is no rigidity, no rebound, no guarding and no CVA tenderness.  Musculoskeletal: Normal range of motion. He exhibits no edema.  Neurological: He is alert.  Speech is clear and goal oriented Moves extremities without ataxia  Skin: Skin is warm and dry. He is not diaphoretic.  Psychiatric: His mood appears anxious. He expresses suicidal ideation. He expresses no homicidal ideation. He expresses suicidal plans. He expresses no homicidal plans.  Nursing note and vitals reviewed.    ED Treatments / Results  Labs (all labs ordered are listed, but only abnormal results are displayed) Labs Reviewed  ACETAMINOPHEN LEVEL - Abnormal; Notable for the following components:      Result Value   Acetaminophen (Tylenol), Serum <10 (*)    All other components within normal limits  RAPID URINE DRUG SCREEN, HOSP PERFORMED - Abnormal; Notable for the following components:   Barbiturates POSITIVE (*)    All other components within normal limits  COMPREHENSIVE METABOLIC PANEL - Abnormal; Notable for the following components:   Sodium 146 (*)    Glucose, Bld 137 (*)    Total Protein 6.4 (*)    All other components within normal limits  URINALYSIS, ROUTINE W REFLEX MICROSCOPIC - Abnormal; Notable for the following components:   Specific Gravity, Urine 1.034 (*)    All other components within normal limits  CBG MONITORING, ED - Abnormal; Notable for the following components:   Glucose-Capillary 149 (*)    All other components within normal limits  ETHANOL  SALICYLATE LEVEL  CBC WITH DIFFERENTIAL/PLATELET  LIPASE, BLOOD  I-STAT TROPONIN, ED    ED ECG REPORT   Date: 11/01/2017  Rate: 99  Rhythm: normal sinus rhythm  QRS Axis: normal  Intervals: normal  ST/T Wave abnormalities: normal  Conduction Disutrbances:none  Narrative Interpretation:  No ST changes  Old EKG Reviewed: unchanged  I have personally reviewed the EKG tracing and  disagree with the computerized printout as noted.         Radiology Ct Abdomen Pelvis W Contrast  Result Date: 10/31/2017 CLINICAL DATA:  47 year old male with left upper quadrant abdominal pain for 3 days. EXAM: CT ABDOMEN AND PELVIS WITH CONTRAST TECHNIQUE: Multidetector CT imaging of the abdomen and pelvis was performed using the standard protocol following bolus administration of intravenous contrast. CONTRAST:  OMNIPAQUE IOHEXOL 300 MG/ML  SOLN COMPARISON:  Coronary CTA 10/30/2017. High [facility] CT Abdomen and Pelvis 06/08/2014. FINDINGS: Lower chest: Negative; mild dependent atelectasis. No pericardial or pleural effusion. Hepatobiliary: Surgically absent gallbladder. Chronic decreased density throughout the liver in keeping with a degree of steatosis. Otherwise normal liver enhancement. Pancreas: Negative. Spleen: Normal spleen. Adrenals/Urinary Tract: Normal adrenal glands. Surgically absent left kidney with multiple left renal space surgical clips. There is a small associated 15 millimeter soft tissue nodule situated among clips adjacent to the left psoas muscle (series 3, image 42 and coronal image 60). This is unchanged since 2016. Normal right renal enhancement and contrast excretion on delayed images. Negative right ureter. Excreted IV contrast within the urinary bladder which otherwise appears normal. Stomach/Bowel: Negative rectum. Redundant but otherwise negative sigmoid colon. Negative descending colon and splenic flexure. The hepatic flexure is redundant, otherwise negative transverse colon. Negative right colon and appendix (series 3, image 68). Negative terminal ileum. Intermittent fluid-filled but nondilated small bowel loops. Decompressed stomach and duodenum. No abdominal free air, free fluid. Vascular/Lymphatic: Reduced IV contrast dosed utilized due to CTA contrast yesterday and solitary kidney. The major arterial structures appear patent. Portal venous  system is patent. No lymphadenopathy. Reproductive: Negative. Other: No pelvic free fluid. Musculoskeletal: No acute osseous abnormality identified. IMPRESSION: 1. No acute or inflammatory process identified in the abdomen or pelvis. 2. Stable CT appearance of the abdomen since 2016: Chronic hepatic steatosis. Stable sequelae of left nephrectomy. Electronically Signed   By: Odessa Fleming M.D.   On: 10/31/2017 08:02   Ct Coronary Morph W/cta Cor W/score W/ca W/cm &/or Wo/cm  Addendum Date: 10/30/2017   ADDENDUM REPORT: 10/30/2017 14:12 CLINICAL DATA:  47 year old male with atypical chest pain and h/o etoh abuse. EXAM: Cardiac/Coronary  CT TECHNIQUE: The patient was scanned on a [employer]. FINDINGS: A 120 kV prospective scan was triggered in the descending thoracic aorta at 111 HU's. Axial non-contrast 3 mm slices were carried out through the heart. The data set was analyzed on a dedicated work station and scored using the Agatson method. Gantry rotation speed was 250 msecs and collimation was .6 mm. No beta blockade and 0.8 mg of sl NTG was given. The 3D data set was reconstructed in 5% intervals of the 67-82 % of the R-R cycle. Diastolic phases were analyzed on a dedicated work station using MPR, MIP and VRT modes. The patient received 80 cc of contrast. Aorta:  Normal size.  No calcifications.  No dissection. Aortic Valve:  Trileaflet.  No calcifications. Coronary Arteries:  Normal coronary origin.  Right dominance. RCA is a large dominant artery that gives rise to PDA and PLA. There is no plaque. Left main is a large artery that gives rise to LAD, ramus intermedius and LCX arteries. LAD is a large vessel that has no plaque. RI is a large artery that has no plaque. LCX is a small non-dominant artery that has no plaque. Other findings: Normal pulmonary vein drainage into the left atrium. Normal let atrial appendage without a thrombus. Normal size of the pulmonary artery. IMPRESSION: 1. Coronary calcium  score of 0. This was 0 percentile for age and sex matched control. 2. Normal coronary origin with right dominance. 3. No evidence of CAD.  Consider non-cardiac causes of chest pain. Electronically Signed   By: Tobias Alexander   On: 10/30/2017 14:12   Result Date: 10/30/2017 EXAM: OVER-READ INTERPRETATION  CT CHEST The following report is an over-read performed  by radiologist Dr. Trudie Reed of [facility] on 10/30/2017. This over-read does not include interpretation of cardiac or coronary anatomy or pathology. The coronary calcium score/coronary CTA interpretation by the cardiologist is attached. COMPARISON:  None. FINDINGS: Within the visualized portions of the thorax there are no suspicious appearing pulmonary nodules or masses, there is no acute consolidative airspace disease, no pleural effusions, no pneumothorax and no lymphadenopathy. Visualized portions of the upper abdomen demonstrates mild diffuse low attenuation throughout the visualized hepatic parenchyma, indicative of hepatic steatosis. There are no aggressive appearing lytic or blastic lesions noted in the visualized portions of the skeleton. IMPRESSION: 1. Hepatic steatosis. Electronically Signed: By: Trudie Reed M.D. On: 10/30/2017 11:04    Procedures Procedures (including critical care time)  Medications Ordered in ED Medications  LORazepam (ATIVAN) injection 0-4 mg (0 mg Intravenous Not Given 11/01/17 0250)    Or  LORazepam (ATIVAN) tablet 0-4 mg ( Oral See Alternative 11/01/17 0250)  LORazepam (ATIVAN) injection 0-4 mg (has no administration in time range)    Or  LORazepam (ATIVAN) tablet 0-4 mg (has no administration in time range)  thiamine (VITAMIN B-1) tablet 100 mg (has no administration in time range)    Or  thiamine (B-1) injection 100 mg (has no administration in time range)  acetaminophen (TYLENOL) tablet 650 mg (has no administration in time range)  ondansetron (ZOFRAN) tablet 4 mg (has no  administration in time range)  alum & mag hydroxide-simeth (MAALOX/MYLANTA) 200-200-20 MG/5ML suspension 30 mL (has no administration in time range)  sodium chloride 0.9 % bolus 1,000 mL (0 mLs Intravenous Stopped 11/01/17 0220)     Initial Impression / Assessment and Plan / ED Course  I have reviewed the triage vital signs and the nursing notes.  Pertinent labs & imaging results that were available during my care of the patient were reviewed by me and considered in my medical decision making (see chart for details).  Clinical Course as of Nov 02 310  Sat Nov 01, 2017  0312 TTS recommends overnight obs   [HM]    Clinical Course User Index [HM] Jacalynn Buzzell, Dahlia Client, [place]    Patient presents with numerous complaints including ideation.  Patient has chest pain.  EKG is without acute abnormality.  Troponin is negative.  Patient had admission for chest pain work-up 2 days ago with normal CT perfusion scan.    Also with abdominal pain.  He was evaluated this morning for the same pain with normal labs and normal CT scan.  Repeat labs tonight.  Mild dehydration but no evidence of sirs or sepsis.  No Leukocytosis.  No Hyperglycemia.    With mild tachycardia here in the emergency department.  Improved after fluids.  BP (!) 121/54 (BP Location: Left Arm)   Pulse 87   Temp 98.2 F (36.8 C) (Oral)   Resp 18   SpO2 96%   At this time there is no emergent medical condition which needs acute intervention.  He will be evaluated by TTS.  Final Clinical Impressions(s) / ED Diagnoses   Final diagnoses:  Suicidal ideation  Generalized abdominal pain  Central chest pain    ED Discharge Orders    None       Jovaun Levene, Boyd Kerbs 11/01/17 0228    Laurieann Friddle, Dahlia Client, PA-C 11/01/17 [id number]    Maia Plan, MD 11/01/17 [id number]

## 2017-10-31 NOTE — ED Notes (Signed)
Patient transported to CT 

## 2017-10-31 NOTE — ED Provider Notes (Signed)
[facility] EMERGENCY DEPARTMENT Provider Note   CSN: [id number] Arrival date & time: 10/31/17  0008     History   Chief Complaint Chief Complaint  Patient presents with  . Abdominal Pain    HPI Barry Daugherty is a 47 y.o. male.  HPI Patient presents to the emergency department with diffuse abdominal pain charted yesterday.  The patient states that he was recently admitted to the hospital for chest pain.  The patient states that this is been an issue but the abdominal pain has increased over the last 24 hours.  The patient states that nothing seems make the condition better or worse.  Patient did not take any medications prior to arrival.  Patient is due to be placed in an inpatient facility for psychiatric issues.  The patient states that otherwise he is feeling no symptoms.  The patient denies chest pain, shortness of breath, headache,blurred vision, neck pain, fever, cough, weakness, numbness, dizziness, anorexia, edema,  nausea, vomiting, diarrhea, rash, back pain, dysuria, hematemesis, bloody stool, near syncope, or syncope. Past Medical History:  Diagnosis Date  . Anxiety   . Asthma   . Bipolar 1 disorder (HCC)   . Brain injury (HCC)   . Chronic kidney disease    nephrectomy 1975  . Depression   . Grand mal seizure [facility])    "controlled w/RX" (10/28/2017)  . Headache    "couple/week" (10/28/2017)  . High cholesterol   . Hypertension   . Migraine    "couple/year" (10/28/2017)  . Myocardial infarction (HCC) 04/2017  . Sleep apnea    "suppose to have CPAP; I don't have one" (10/28/2017)  . Stroke [facility]) 03/2017   "left side is weaker since" (10/28/2017)  . Type I diabetes mellitus (HCC)    "dx'd 2005; can't afford insulin so I use pills" (10/28/2017)    Patient Active Problem List   Diagnosis Date Noted  . Chest pain 10/28/2017  . MDD (major depressive disorder), recurrent severe, without psychosis (HCC) 10/15/2017  . MDD (major depressive disorder)  04/12/2015  . Substance induced mood disorder (HCC) 04/04/2015  . Severe episode of recurrent major depressive disorder, without psychotic features (HCC)   . GAD (generalized anxiety disorder)     Past Surgical History:  Procedure Laterality Date  . CHOLECYSTECTOMY OPEN    . NEPHRECTOMY Left 1975        Home Medications    Prior to Admission medications   Medication Sig Start Date End Date Taking? Authorizing Provider  acetaminophen (TYLENOL) 325 MG tablet Take 2 tablets (650 mg total) by mouth every 4 (four) hours as needed for headache or mild pain. 10/30/17  Yes Regalado, Belkys A, MD  albuterol (PROVENTIL HFA;VENTOLIN HFA) 108 (90 Base) MCG/ACT inhaler Inhale 1-2 puffs into the lungs every 6 (six) hours as needed for wheezing or shortness of breath. 10/30/17  Yes Regalado, Belkys A, MD  amLODipine (NORVASC) 10 MG tablet Take 1 tablet (10 mg total) by mouth daily. For high blood pressure 10/30/17  Yes Regalado, Belkys A, MD  atenolol (TENORMIN) 50 MG tablet Take 1 tablet (50 mg total) by mouth daily. For high blood pressure 10/30/17  Yes Regalado, Belkys A, MD  busPIRone (BUSPAR) 15 MG tablet Take 1 tablet (15 mg total) by mouth 2 (two) times daily. For anxiety 10/27/17  Yes Nwoko, Nicole Kindred I, NP  cyclobenzaprine (FLEXERIL) 5 MG tablet Take 1 tablet (5 mg total) by mouth 3 (three) times daily as needed for muscle spasms. 10/27/17  Yes Armandina Stammer I, NP  divalproex (DEPAKOTE ER) 500 MG 24 hr tablet Take 2 tablets (1,000 mg total) by mouth at bedtime. For mood stabilization 10/27/17  Yes Nwoko, Nicole Kindred I, NP  FLUoxetine (PROZAC) 40 MG capsule Take 1 capsule (40 mg total) by mouth daily. For depression 10/28/17  Yes Armandina Stammer I, NP  gabapentin (NEURONTIN) 300 MG capsule Take 1 capsule (300 mg total) by mouth 4 (four) times daily -  with meals and at bedtime. For agitation 10/27/17  Yes Armandina Stammer I, NP  hydrOXYzine (ATARAX/VISTARIL) 25 MG tablet Take 1 tablet (25 mg total) by mouth 3 (three)  times daily as needed for anxiety. 10/27/17  Yes Armandina Stammer I, NP  lamoTRIgine (LAMICTAL) 25 MG tablet Take 1 tablet (25 mg total) by mouth 2 (two) times daily. For mood stabilization 10/27/17  Yes Nwoko, Nicole Kindred I, NP  METFORMIN HCL PO Take 1 tablet by mouth daily.   Yes [provider]  mirtazapine (REMERON) 15 MG tablet Take 1 tablet (15 mg total) by mouth at bedtime. For depression/sleep 10/27/17  Yes Armandina Stammer I, NP  pantoprazole (PROTONIX) 40 MG tablet Take 1 tablet (40 mg total) by mouth 2 (two) times daily. 10/30/17  Yes Regalado, Prentiss Bells, MD    Family History Family History  Problem Relation Age of Onset  . Healthy Mother   . Heart attack Father 53  . Pancreatic cancer Father   . Healthy Sister   . Healthy Brother     Social History Social History   Tobacco Use  . Smoking status: Former Smoker    Packs/day: 0.50    Years: 29.00    Pack years: 14.50    Types: Cigarettes    Last attempt to quit: 04/15/2017    Years since quitting: 0.5  . Smokeless tobacco: Never Used  Substance Use Topics  . Alcohol use: No    Comment: 10/28/2017 "nothing to drink since 2005; I'm at [facility] now"  . Drug use: Yes    Types: Cocaine, Marijuana, Methamphetamines    Comment: 10/28/2017 "clean for 6 months now"     Allergies   Ceftin [cefuroxime axetil]; Reglan [metoclopramide]; and Penicillins   Review of Systems Review of Systems All other systems negative except as documented in the HPI. All pertinent positives and negatives as reviewed in the HPI.  Physical Exam Updated Vital Signs BP 128/73   Pulse 91   Temp 98.4 F (36.9 C) (Oral)   Resp 17   Ht 5\' 9"  (1.753 m)   Wt 103 kg (227 lb)   SpO2 96%   BMI 33.52 kg/m   Physical Exam  Constitutional: He is oriented to person, place, and time. He appears well-developed and well-nourished. No distress.  HENT:  Head: Normocephalic and atraumatic.  Mouth/Throat: Oropharynx is clear and moist.  Eyes: Pupils are  equal, round, and reactive to light.  Neck: Normal range of motion. Neck supple.  Cardiovascular: Normal rate, regular rhythm and normal heart sounds. Exam reveals no gallop and no friction rub.  No murmur heard. Pulmonary/Chest: Effort normal and breath sounds normal. No respiratory distress. He has no wheezes.  Abdominal: Soft. Bowel sounds are normal. He exhibits no distension. There is generalized tenderness. There is no rigidity, no rebound, no guarding, no CVA tenderness, no tenderness at McBurney's point and negative Murphy's sign. No hernia.  Neurological: He is alert and oriented to person, place, and time. He exhibits normal muscle tone. Coordination normal.  Skin: Skin is  warm and dry. Capillary refill takes less than 2 seconds. No rash noted. No erythema.  Psychiatric: He has a normal mood and affect. His behavior is normal.  Nursing note and vitals reviewed.    ED Treatments / Results  Labs (all labs ordered are listed, but only abnormal results are displayed) Labs Reviewed  COMPREHENSIVE METABOLIC PANEL - Abnormal; Notable for the following components:      Result Value   Glucose, Bld 205 (*)    Creatinine, Ser 1.39 (*)    Total Protein 6.3 (*)    All other components within normal limits  URINALYSIS, ROUTINE W REFLEX MICROSCOPIC - Abnormal; Notable for the following components:   Specific Gravity, Urine 1.035 (*)    All other components within normal limits  LIPASE, BLOOD  CBC    EKG None  Radiology Ct Abdomen Pelvis W Contrast  Result Date: 10/31/2017 CLINICAL DATA:  47 year old male with left upper quadrant abdominal pain for 3 days. EXAM: CT ABDOMEN AND PELVIS WITH CONTRAST TECHNIQUE: Multidetector CT imaging of the abdomen and pelvis was performed using the standard protocol following bolus administration of intravenous contrast. CONTRAST:  OMNIPAQUE IOHEXOL 300 MG/ML  SOLN COMPARISON:  Coronary CTA 10/30/2017. [facility]  CT Abdomen and  Pelvis 06/08/2014. FINDINGS: Lower chest: Negative; mild dependent atelectasis. No pericardial or pleural effusion. Hepatobiliary: Surgically absent gallbladder. Chronic decreased density throughout the liver in keeping with a degree of steatosis. Otherwise normal liver enhancement. Pancreas: Negative. Spleen: Normal spleen. Adrenals/Urinary Tract: Normal adrenal glands. Surgically absent left kidney with multiple left renal space surgical clips. There is a small associated 15 millimeter soft tissue nodule situated among clips adjacent to the left psoas muscle (series 3, image 42 and coronal image 60). This is unchanged since 2016. Normal right renal enhancement and contrast excretion on delayed images. Negative right ureter. Excreted IV contrast within the urinary bladder which otherwise appears normal. Stomach/Bowel: Negative rectum. Redundant but otherwise negative sigmoid colon. Negative descending colon and splenic flexure. The hepatic flexure is redundant, otherwise negative transverse colon. Negative right colon and appendix (series 3, image 68). Negative terminal ileum. Intermittent fluid-filled but nondilated small bowel loops. Decompressed stomach and duodenum. No abdominal free air, free fluid. Vascular/Lymphatic: Reduced IV contrast dosed utilized due to CTA contrast yesterday and solitary kidney. The major arterial structures appear patent. Portal venous system is patent. No lymphadenopathy. Reproductive: Negative. Other: No pelvic free fluid. Musculoskeletal: No acute osseous abnormality identified. IMPRESSION: 1. No acute or inflammatory process identified in the abdomen or pelvis. 2. Stable CT appearance of the abdomen since 2016: Chronic hepatic steatosis. Stable sequelae of left nephrectomy. Electronically Signed   By: Odessa Fleming M.D.   On: 10/31/2017 08:02   Ct Coronary Morph W/cta Cor W/score W/ca W/cm &/or Wo/cm  Addendum Date: 10/30/2017   ADDENDUM REPORT: 10/30/2017 14:12 CLINICAL DATA:   47 year old male with atypical chest pain and h/o etoh abuse. EXAM: Cardiac/Coronary  CT TECHNIQUE: The patient was scanned on a [employer]. FINDINGS: A 120 kV prospective scan was triggered in the descending thoracic aorta at 111 HU's. Axial non-contrast 3 mm slices were carried out through the heart. The data set was analyzed on a dedicated work station and scored using the Agatson method. Gantry rotation speed was 250 msecs and collimation was .6 mm. No beta blockade and 0.8 mg of sl NTG was given. The 3D data set was reconstructed in 5% intervals of the 67-82 % of the R-R cycle. Diastolic phases  were analyzed on a dedicated work station using MPR, MIP and VRT modes. The patient received 80 cc of contrast. Aorta:  Normal size.  No calcifications.  No dissection. Aortic Valve:  Trileaflet.  No calcifications. Coronary Arteries:  Normal coronary origin.  Right dominance. RCA is a large dominant artery that gives rise to PDA and PLA. There is no plaque. Left main is a large artery that gives rise to LAD, ramus intermedius and LCX arteries. LAD is a large vessel that has no plaque. RI is a large artery that has no plaque. LCX is a small non-dominant artery that has no plaque. Other findings: Normal pulmonary vein drainage into the left atrium. Normal let atrial appendage without a thrombus. Normal size of the pulmonary artery. IMPRESSION: 1. Coronary calcium score of 0. This was 0 percentile for age and sex matched control. 2. Normal coronary origin with right dominance. 3. No evidence of CAD.  Consider non-cardiac causes of chest pain. Electronically Signed   By: Tobias Alexander   On: 10/30/2017 14:12   Result Date: 10/30/2017 EXAM: OVER-READ INTERPRETATION  CT CHEST The following report is an over-read performed by radiologist Dr. Trudie Reed of [facility] on 10/30/2017. This over-read does not include interpretation of cardiac or coronary anatomy or pathology. The coronary calcium  score/coronary CTA interpretation by the cardiologist is attached. COMPARISON:  None. FINDINGS: Within the visualized portions of the thorax there are no suspicious appearing pulmonary nodules or masses, there is no acute consolidative airspace disease, no pleural effusions, no pneumothorax and no lymphadenopathy. Visualized portions of the upper abdomen demonstrates mild diffuse low attenuation throughout the visualized hepatic parenchyma, indicative of hepatic steatosis. There are no aggressive appearing lytic or blastic lesions noted in the visualized portions of the skeleton. IMPRESSION: 1. Hepatic steatosis. Electronically Signed: By: Trudie Reed M.D. On: 10/30/2017 11:04    Procedures Procedures (including critical care time)  Medications Ordered in ED Medications  sodium chloride 0.9 % bolus 1,000 mL (1,000 mLs Intravenous New Bag/Given 10/31/17 0703)  iohexol (OMNIPAQUE) 300 MG/ML solution 100 mL (100 mLs Intravenous Contrast Given 10/31/17 0730)     Initial Impression / Assessment and Plan / ED Course  I have reviewed the triage vital signs and the nursing notes.  Pertinent labs & imaging results that were available during my care of the patient were reviewed by me and considered in my medical decision making (see chart for details).     Patient has a negative CT scan.  The patient will be discharged home.  He did eat a meal and had some fluids by mouth without difficulty.  Patient is advised to return here as needed.  Final Clinical Impressions(s) / ED Diagnoses   Final diagnoses:  None    ED Discharge Orders    None       Charlestine Night, PA-C 10/31/17 1558    Little, Ambrose Finland, MD 11/01/17 1550

## 2017-10-31 NOTE — ED Notes (Signed)
ED Provider at bedside. 

## 2017-10-31 NOTE — Progress Notes (Addendum)
1:42pm- CSW spoke with June Arnold from [facility] and was informed that they can not take pt back until Monday. CSW expressed that pt expressed that pt has already started the assessment process at [facility] June expressed that they are still not able to help pt today. CSW has updated RN, PA, and pt of this at this time. CSW has given pt clothing, shelter list, and bus passes to get to next destination. There are no further CSW needs at this time. CSW will sign off.   12:22pm- CSW informed that pt was supposed to go to [facility] on yesterday, however pt did not make it there. CSW reached out to [facility] to get address to send pt. CSW elft VM for June Arnold as directed by secretary as they only do intakes for pt's in the morning. CSW awaits call back at this time.  CSW consulted for transportation to [facility] for pt. CSW provided pt's RN with taxi vouchure to [facility] at this time.    Barry Daugherty Barry Daugherty, MSW, LCSW-A Emergency Department Clinical Social Worker [phone]

## 2017-10-31 NOTE — ED Triage Notes (Signed)
Pt reports he is suicidal and wants to OD

## 2017-10-31 NOTE — Medical Student Note (Signed)
[facility]-EMERGENCY DEPT Provider Student Note For educational purposes for Medical, PA and NP students only and not part of the legal medical record.   CSN: [id number] Arrival date & time: 10/31/17  0008     History   Chief Complaint Chief Complaint  Patient presents with  . Abdominal Pain    HPI Barry Daugherty is a 47 y.o. male. He presents today for abdominal pain for the past 3-4 days. It is worsened when moving and having a bowel movement. He states that he has never had anything like this before. He reports 9/10 "burning" pain in the LUQ radiating to the LLQ. His most recent bowel movement was this morning and he reports that it was hard and dark brown. He states that he was in the hospital in [place] last year and was told that he had stool "backed all the way up" at that time. He endorses nausea, hard stools, red blood per rectum and bloating. He denies vomiting, fever and black stools. He states that his father was diagnosed with colon cancer at age 8 and has since passed away due to colon cancer. He has never had a colonoscopy or stool blood test. He denies any history of diverticulitis or irritable bowel disease. He still has an appendix but has had his gallbladder removed. He was seen in the E.D. 4 days ago with chest pain that was attributed to severe GERD and given pantoprazole and pepcid. He states that he hasn't had the pepcid filled yet and so hasn't taken it but he believes that he has taken the pantoprazole.  HPI  Past Medical History:  Diagnosis Date  . Anxiety   . Asthma   . Bipolar 1 disorder (HCC)   . Brain injury (HCC)   . Chronic kidney disease    nephrectomy 1975  . Depression   . Grand mal seizure [facility])    "controlled w/RX" (10/28/2017)  . Headache    "couple/week" (10/28/2017)  . High cholesterol   . Hypertension   . Migraine    "couple/year" (10/28/2017)  . Myocardial infarction (HCC) 04/2017  . Sleep apnea    "suppose to have CPAP; I don't have one"  (10/28/2017)  . Stroke [facility]) 03/2017   "left side is weaker since" (10/28/2017)  . Type I diabetes mellitus (HCC)    "dx'd 2005; can't afford insulin so I use pills" (10/28/2017)    Patient Active Problem List   Diagnosis Date Noted  . Chest pain 10/28/2017  . MDD (major depressive disorder), recurrent severe, without psychosis (HCC) 10/15/2017  . MDD (major depressive disorder) 04/12/2015  . Substance induced mood disorder (HCC) 04/04/2015  . Severe episode of recurrent major depressive disorder, without psychotic features (HCC)   . GAD (generalized anxiety disorder)     Past Surgical History:  Procedure Laterality Date  . CHOLECYSTECTOMY OPEN    . NEPHRECTOMY Left 1975       Home Medications    Prior to Admission medications   Medication Sig Start Date End Date Taking? Authorizing Provider  acetaminophen (TYLENOL) 325 MG tablet Take 2 tablets (650 mg total) by mouth every 4 (four) hours as needed for headache or mild pain. 10/30/17   Regalado, Belkys A, MD  albuterol (PROVENTIL HFA;VENTOLIN HFA) 108 (90 Base) MCG/ACT inhaler Inhale 1-2 puffs into the lungs every 6 (six) hours as needed for wheezing or shortness of breath. 10/30/17   Regalado, Belkys A, MD  amLODipine (NORVASC) 10 MG tablet Take 1 tablet (10 mg total)  by mouth daily. For high blood pressure 10/30/17   Regalado, Belkys A, MD  atenolol (TENORMIN) 50 MG tablet Take 1 tablet (50 mg total) by mouth daily. For high blood pressure 10/30/17   Regalado, Belkys A, MD  busPIRone (BUSPAR) 15 MG tablet Take 1 tablet (15 mg total) by mouth 2 (two) times daily. For anxiety 10/27/17   Armandina Stammer I, NP  cyclobenzaprine (FLEXERIL) 5 MG tablet Take 1 tablet (5 mg total) by mouth 3 (three) times daily as needed for muscle spasms. 10/27/17   Armandina Stammer I, NP  divalproex (DEPAKOTE ER) 500 MG 24 hr tablet Take 2 tablets (1,000 mg total) by mouth at bedtime. For mood stabilization 10/27/17   Armandina Stammer I, NP  FLUoxetine (PROZAC) 40 MG  capsule Take 1 capsule (40 mg total) by mouth daily. For depression 10/28/17   Armandina Stammer I, NP  gabapentin (NEURONTIN) 300 MG capsule Take 1 capsule (300 mg total) by mouth 4 (four) times daily -  with meals and at bedtime. For agitation 10/27/17   Armandina Stammer I, NP  hydrOXYzine (ATARAX/VISTARIL) 25 MG tablet Take 1 tablet (25 mg total) by mouth 3 (three) times daily as needed for anxiety. 10/27/17   Armandina Stammer I, NP  lamoTRIgine (LAMICTAL) 25 MG tablet Take 1 tablet (25 mg total) by mouth 2 (two) times daily. For mood stabilization 10/27/17   Armandina Stammer I, NP  METFORMIN HCL PO Take 1 tablet by mouth daily.    [provider]  mirtazapine (REMERON) 15 MG tablet Take 1 tablet (15 mg total) by mouth at bedtime. For depression/sleep 10/27/17   Armandina Stammer I, NP  pantoprazole (PROTONIX) 40 MG tablet Take 1 tablet (40 mg total) by mouth 2 (two) times daily. 10/30/17   Regalado, Prentiss Bells, MD    Family History Family History  Problem Relation Age of Onset  . Healthy Mother   . Heart attack Father 71  . Pancreatic cancer Father   . Healthy Sister   . Healthy Brother     Social History Social History   Tobacco Use  . Smoking status: Former Smoker    Packs/day: 0.50    Years: 29.00    Pack years: 14.50    Types: Cigarettes    Last attempt to quit: 04/15/2017    Years since quitting: 0.5  . Smokeless tobacco: Never Used  Substance Use Topics  . Alcohol use: No    Comment: 10/28/2017 "nothing to drink since 2005; I'm at [facility] now"  . Drug use: Yes    Types: Cocaine, Marijuana, Methamphetamines    Comment: 10/28/2017 "clean for 6 months now"     Allergies   Ceftin [cefuroxime axetil]; Reglan [metoclopramide]; and Penicillins   Review of Systems Review of Systems   Physical Exam Updated Vital Signs BP 140/87   Pulse 94   Temp 98.4 F (36.9 C) (Oral)   Resp 18   Ht 5\' 9"  (1.753 m)   Wt 103 kg   SpO2 94%   BMI 33.52 kg/m   Physical Exam  Constitutional:  He is well-developed, well-nourished, and in no distress.  He is visibly uncomfortable  HENT:  Head: Normocephalic and atraumatic.  Eyes: No scleral icterus.  Cardiovascular: Normal rate and normal heart sounds. Exam reveals no gallop and no friction rub.  No murmur heard. Pulmonary/Chest: Effort normal and breath sounds normal.  Abdominal: Soft. Bowel sounds are normal. He exhibits no distension. There is tenderness. There is no rebound and no  guarding.  Generalized abdominal pain in all quadrants, reports worse upon palpation and worse in LLQ. Positive Rosvings, McMurray's and Psoas signs  Lymphadenopathy:    He has no cervical adenopathy.  Skin: Diaphoretic: visibly uncomfortable.     ED Treatments / Results  Labs (all labs ordered are listed, but only abnormal results are displayed) Labs Reviewed  COMPREHENSIVE METABOLIC PANEL - Abnormal; Notable for the following components:      Result Value   Glucose, Bld 205 (*)    Creatinine, Ser 1.39 (*)    Total Protein 6.3 (*)    All other components within normal limits  URINALYSIS, ROUTINE W REFLEX MICROSCOPIC - Abnormal; Notable for the following components:   Specific Gravity, Urine 1.035 (*)    All other components within normal limits  LIPASE, BLOOD  CBC    EKG None Radiology Ct Coronary Morph W/cta Cor W/score W/ca W/cm &/or Wo/cm  Addendum Date: 10/30/2017   ADDENDUM REPORT: 10/30/2017 14:12 CLINICAL DATA:  47 year old male with atypical chest pain and h/o etoh abuse. EXAM: Cardiac/Coronary  CT TECHNIQUE: The patient was scanned on a [employer]. FINDINGS: A 120 kV prospective scan was triggered in the descending thoracic aorta at 111 HU's. Axial non-contrast 3 mm slices were carried out through the heart. The data set was analyzed on a dedicated work station and scored using the Agatson method. Gantry rotation speed was 250 msecs and collimation was .6 mm. No beta blockade and 0.8 mg of sl NTG was given. The 3D  data set was reconstructed in 5% intervals of the 67-82 % of the R-R cycle. Diastolic phases were analyzed on a dedicated work station using MPR, MIP and VRT modes. The patient received 80 cc of contrast. Aorta:  Normal size.  No calcifications.  No dissection. Aortic Valve:  Trileaflet.  No calcifications. Coronary Arteries:  Normal coronary origin.  Right dominance. RCA is a large dominant artery that gives rise to PDA and PLA. There is no plaque. Left main is a large artery that gives rise to LAD, ramus intermedius and LCX arteries. LAD is a large vessel that has no plaque. RI is a large artery that has no plaque. LCX is a small non-dominant artery that has no plaque. Other findings: Normal pulmonary vein drainage into the left atrium. Normal let atrial appendage without a thrombus. Normal size of the pulmonary artery. IMPRESSION: 1. Coronary calcium score of 0. This was 0 percentile for age and sex matched control. 2. Normal coronary origin with right dominance. 3. No evidence of CAD.  Consider non-cardiac causes of chest pain. Electronically Signed   By: Tobias Alexander   On: 10/30/2017 14:12   Result Date: 10/30/2017 EXAM: OVER-READ INTERPRETATION  CT CHEST The following report is an over-read performed by radiologist Dr. Trudie Reed of [facility] on 10/30/2017. This over-read does not include interpretation of cardiac or coronary anatomy or pathology. The coronary calcium score/coronary CTA interpretation by the cardiologist is attached. COMPARISON:  None. FINDINGS: Within the visualized portions of the thorax there are no suspicious appearing pulmonary nodules or masses, there is no acute consolidative airspace disease, no pleural effusions, no pneumothorax and no lymphadenopathy. Visualized portions of the upper abdomen demonstrates mild diffuse low attenuation throughout the visualized hepatic parenchyma, indicative of hepatic steatosis. There are no aggressive appearing lytic or  blastic lesions noted in the visualized portions of the skeleton. IMPRESSION: 1. Hepatic steatosis. Electronically Signed: By: Trudie Reed M.D. On: 10/30/2017 11:04  Procedures Procedures (including critical care time)  Medications Ordered in ED Medications - No data to display   Initial Impression / Assessment and Plan / ED Course  I have reviewed the triage vital signs and the nursing notes.  Pertinent labs & imaging results that were available during my care of the patient were reviewed by me and considered in my medical decision making (see chart for details).     CT scan negative for acute issue such as diverticulitis, appendicitis or bowel obstruction. Pain and bleeding are likely due to constipation from hard stools. Increase fiber intake, utilize stool softener, such as MiraLax, to ease stool passage. Bleeding is likely from hemorrhoids and should decrease with eased stool passage. Try not to strain when having a bowel movement. Stay well hydrated with water daily.  Return to the E.D. For worsening constipation, severe pain, vomiting, fever, black-tarry stool or weakness. See your primary care doctor if current symptoms do not resolve with Miralax, hydration and fiber.  Final Clinical Impressions(s) / ED Diagnoses   Final diagnoses:  None    New Prescriptions New Prescriptions   No medications on file

## 2017-10-31 NOTE — ED Notes (Signed)
Breakfast tray ordered for patient.

## 2017-11-01 DIAGNOSIS — F4323 Adjustment disorder with mixed anxiety and depressed mood: Secondary | ICD-10-CM | POA: Diagnosis present

## 2017-11-01 LAB — COMPREHENSIVE METABOLIC PANEL
ALT: 40 U/L (ref 0–44)
ANION GAP: 8 (ref 5–15)
AST: 35 U/L (ref 15–41)
Albumin: 3.5 g/dL (ref 3.5–5.0)
Alkaline Phosphatase: 48 U/L (ref 38–126)
BILIRUBIN TOTAL: 0.4 mg/dL (ref 0.3–1.2)
BUN: 16 mg/dL (ref 6–20)
CO2: 28 mmol/L (ref 22–32)
Calcium: 9.2 mg/dL (ref 8.9–10.3)
Chloride: 110 mmol/L (ref 98–111)
Creatinine, Ser: 1.16 mg/dL (ref 0.61–1.24)
GFR calc Af Amer: >60 mL/min (ref >60)
Glucose, Bld: 137 mg/dL — ABNORMAL HIGH (ref 70–99)
POTASSIUM: 3.8 mmol/L (ref 3.5–5.1)
Sodium: 146 mmol/L — ABNORMAL HIGH (ref 135–145)
TOTAL PROTEIN: 6.4 g/dL — AB (ref 6.5–8.1)

## 2017-11-01 LAB — CBC WITH DIFFERENTIAL/PLATELET
BASOS PCT: 1 %
Basophils Absolute: 0 10*3/uL (ref 0.0–0.1)
EOS PCT: 3 %
Eosinophils Absolute: 0.2 10*3/uL (ref 0.0–0.7)
HEMATOCRIT: 39.1 % (ref 39.0–52.0)
Hemoglobin: 13.2 g/dL (ref 13.0–17.0)
LYMPHS PCT: 35 %
Lymphs Abs: 2.5 10*3/uL (ref 0.7–4.0)
MCH: 30.6 pg (ref 26.0–34.0)
MCHC: 33.8 g/dL (ref 30.0–36.0)
MCV: 90.5 fL (ref 78.0–100.0)
MONO ABS: 0.7 10*3/uL (ref 0.1–1.0)
MONOS PCT: 10 %
NEUTROS ABS: 3.6 10*3/uL (ref 1.7–7.7)
Neutrophils Relative %: 51 %
PLATELETS: 306 10*3/uL (ref 150–400)
RBC: 4.32 MIL/uL (ref 4.22–5.81)
RDW: 13.6 % (ref 11.5–15.5)
WBC: 7.1 10*3/uL (ref 4.0–10.5)

## 2017-11-01 LAB — URINALYSIS, ROUTINE W REFLEX MICROSCOPIC
Bilirubin Urine: NEGATIVE
Glucose, UA: NEGATIVE mg/dL
HGB URINE DIPSTICK: NEGATIVE
KETONES UR: NEGATIVE mg/dL
Leukocytes, UA: NEGATIVE
Nitrite: NEGATIVE
PROTEIN: NEGATIVE mg/dL
Specific Gravity, Urine: 1.034 — ABNORMAL HIGH (ref 1.005–1.030)
pH: 6 (ref 5.0–8.0)

## 2017-11-01 LAB — LIPASE, BLOOD: LIPASE: 33 U/L (ref 11–51)

## 2017-11-01 LAB — I-STAT TROPONIN, ED: TROPONIN I, POC: 0 ng/mL (ref 0.00–0.08)

## 2017-11-01 MED ORDER — ATENOLOL 50 MG PO TABS
50.0000 mg | ORAL_TABLET | Freq: Every day | ORAL | Status: DC
  Administered 2017-11-01: 50 mg via ORAL
  Filled 2017-11-01: qty 1

## 2017-11-01 MED ORDER — AMLODIPINE BESYLATE 5 MG PO TABS
10.0000 mg | ORAL_TABLET | Freq: Every day | ORAL | Status: DC
  Administered 2017-11-01: 10 mg via ORAL
  Filled 2017-11-01: qty 2

## 2017-11-01 MED ORDER — LORAZEPAM 1 MG PO TABS: 0.0000 mg | ORAL_TABLET | Freq: Two times a day (BID) | ORAL | Status: DC

## 2017-11-01 MED ORDER — THIAMINE HCL 100 MG/ML IJ SOLN: 100.0000 mg | Freq: Every day | INTRAMUSCULAR | Status: DC

## 2017-11-01 MED ORDER — ONDANSETRON HCL 4 MG PO TABS: 4.0000 mg | ORAL_TABLET | Freq: Three times a day (TID) | ORAL | Status: DC | PRN

## 2017-11-01 MED ORDER — CYCLOBENZAPRINE HCL 10 MG PO TABS: 5.0000 mg | ORAL_TABLET | Freq: Three times a day (TID) | ORAL | Status: DC | PRN

## 2017-11-01 MED ORDER — METFORMIN HCL 500 MG PO TABS
500.0000 mg | ORAL_TABLET | Freq: Every day | ORAL | Status: DC
  Administered 2017-11-01: 500 mg via ORAL
  Filled 2017-11-01: qty 1

## 2017-11-01 MED ORDER — GABAPENTIN 300 MG PO CAPS
300.0000 mg | ORAL_CAPSULE | Freq: Three times a day (TID) | ORAL | Status: DC
  Administered 2017-11-01: 300 mg via ORAL
  Filled 2017-11-01: qty 1

## 2017-11-01 MED ORDER — BUSPIRONE HCL 10 MG PO TABS
15.0000 mg | ORAL_TABLET | Freq: Two times a day (BID) | ORAL | Status: DC
  Administered 2017-11-01: 15 mg via ORAL
  Filled 2017-11-01: qty 2

## 2017-11-01 MED ORDER — PANTOPRAZOLE SODIUM 40 MG PO TBEC
40.0000 mg | DELAYED_RELEASE_TABLET | Freq: Two times a day (BID) | ORAL | Status: DC
  Administered 2017-11-01: 40 mg via ORAL
  Filled 2017-11-01: qty 1

## 2017-11-01 MED ORDER — DIVALPROEX SODIUM ER 500 MG PO TB24: 1000.0000 mg | ORAL_TABLET | Freq: Every day | ORAL | Status: DC

## 2017-11-01 MED ORDER — ACETAMINOPHEN 325 MG PO TABS: 650.0000 mg | ORAL_TABLET | ORAL | Status: DC | PRN

## 2017-11-01 MED ORDER — LORAZEPAM 2 MG/ML IJ SOLN: 0.0000 mg | Freq: Four times a day (QID) | INTRAMUSCULAR | Status: DC

## 2017-11-01 MED ORDER — LORAZEPAM 2 MG/ML IJ SOLN: 0.0000 mg | Freq: Two times a day (BID) | INTRAMUSCULAR | Status: DC

## 2017-11-01 MED ORDER — ALUM & MAG HYDROXIDE-SIMETH 200-200-20 MG/5ML PO SUSP: 30.0000 mL | Freq: Four times a day (QID) | ORAL | Status: DC | PRN

## 2017-11-01 MED ORDER — LORAZEPAM 1 MG PO TABS: 0.0000 mg | ORAL_TABLET | Freq: Four times a day (QID) | ORAL | Status: DC

## 2017-11-01 MED ORDER — LAMOTRIGINE 25 MG PO TABS
25.0000 mg | ORAL_TABLET | Freq: Two times a day (BID) | ORAL | Status: DC
  Administered 2017-11-01: 25 mg via ORAL
  Filled 2017-11-01: qty 1

## 2017-11-01 MED ORDER — FLUOXETINE HCL 20 MG PO CAPS
40.0000 mg | ORAL_CAPSULE | Freq: Every day | ORAL | Status: DC
  Administered 2017-11-01: 40 mg via ORAL
  Filled 2017-11-01: qty 2

## 2017-11-01 MED ORDER — ALBUTEROL SULFATE HFA 108 (90 BASE) MCG/ACT IN AERS: 1.0000 | INHALATION_SPRAY | Freq: Four times a day (QID) | RESPIRATORY_TRACT | Status: DC | PRN

## 2017-11-01 MED ORDER — MIRTAZAPINE 30 MG PO TABS: 15.0000 mg | ORAL_TABLET | Freq: Every day | ORAL | Status: DC

## 2017-11-01 MED ORDER — HYDROXYZINE HCL 25 MG PO TABS: 25.0000 mg | ORAL_TABLET | Freq: Three times a day (TID) | ORAL | Status: DC | PRN

## 2017-11-01 MED ORDER — VITAMIN B-1 100 MG PO TABS
100.0000 mg | ORAL_TABLET | Freq: Every day | ORAL | Status: DC
  Administered 2017-11-01: 100 mg via ORAL
  Filled 2017-11-01: qty 1
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
## 2017-11-01 NOTE — Discharge Instructions (Signed)
Please follow up with [facility] ° °[address] °[place], [place] [address] [phone] ° ° °

## 2017-11-01 NOTE — ED Notes (Signed)
URINE REQUESTED

## 2017-11-01 NOTE — Patient Outreach (Signed)
CPSS met with the patient to provide substance use recovery support and help with recovery resources. CPSS met with the patient on 10/15/17 in the [facility] before he went to [facility] and was discharged from [facility] on 10/27/17. Patient plans to go to [facility] on Monday 11/03/17 for assessment for an admission to their residential substance use treatment program. CPSS provided resources including [place] bus passes, [place] bus passes, information for [employer], [employer], homeless shelter resources, and CPSS contact information. CPSS talked to the patient about how the Part bus can help patient with transportation in order to get to [facility] on Monday for [facility] admission. CPSS encouraged the patient to get connected with a shelter for the time being until Monday admission. CPSS strongly encouraged the patient contact CPSS for further help with recovery resources after discharge from the hospital.

## 2017-11-01 NOTE — ED Notes (Signed)
Patient has been changed out into burgundy scrubs and personal belongings have been bagged with green tag on top of bags. Once report is given Security will be called to escort back to TCU.

## 2017-11-01 NOTE — [facility] Suicide Risk Assessment (Cosign Needed Addendum)
Suicide Risk Assessment  Discharge Assessment   [facility] Discharge Suicide Risk Assessment   Principal Problem: Adjustment disorder with mixed anxiety and depressed mood Discharge Diagnoses:  Patient Active Problem List   Diagnosis Date Noted  . Adjustment disorder with mixed anxiety and depressed mood [F43.23] 11/01/2017  . Chest pain [R07.9] 10/28/2017  . MDD (major depressive disorder), recurrent severe, without psychosis (HCC) [F33.2] 10/15/2017  . MDD (major depressive disorder) [F32.9] 04/12/2015  . 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 Severe episode of recurrent major depressive disorder, without psychotic features (HCC) [F33.2]   . GAD (generalized anxiety disorder) [F41.1]    Pt was seen and chart reviewed with treatment team and Dr Jannifer Franklin.  Pt denies suicidal/homicidal ideation, denies auditory/visual hallucinations and does not appear to be responding to internal stimuli. Pt is homeless and has been for 20 plus years. Pt stated he went to [place] to reunite with family but they didn't want him so he returned to [facility]. He stated he lost his disability and is trying to get it back so he can have housing. Pt's UDS positive for Fiorocet and BAL negative. Pt is stable and psychiatrically clear for discharge.  Total Time spent with patient: 30 minutes  Musculoskeletal: Strength & Muscle Tone: within normal limits Gait & Station: normal Patient leans: N/A  Psychiatric Specialty Exam:   Blood pressure 124/66, pulse 81, temperature 98.2 F (36.8 C), temperature source Oral, resp. rate (!) 22, SpO2 93 %.There is no height or weight on file to calculate BMI.  General Appearance: Casual  Eye Contact::  Good  Speech:  Clear and Coherent and Normal Rate409  Volume:  Normal  Mood:  Depressed  Affect:  Congruent and Depressed  Thought Process:  Coherent, Goal Directed and Linear  Orientation:  Full (Time, Place, and Person)  Thought Content:  Logical  Suicidal Thoughts:   No  Homicidal Thoughts:  No  Memory:  Immediate;   Good Recent;   Good Remote;   Fair  Judgement:  Fair  Insight:  Lacking  Psychomotor Activity:  Normal  Concentration:  Good  Recall:  Good  Fund of Knowledge:Good  Language: Good  Akathisia:  No  Handed:  Right  AIMS (if indicated):     Assets:  Communication Skills Social Support  Sleep:     Cognition: WNL  ADL's:  Intact   Mental Status Per Nursing Assessment::   On Admission:     Demographic Factors:  Male, Caucasian, Low socioeconomic status and Unemployed  Loss Factors: Financial problems/change in socioeconomic status  Historical Factors: Impulsivity  Risk Reduction Factors:   Sense of responsibility to family  Continued Clinical Symptoms:  Severe Anxiety and/or Agitation Depression:   Impulsivity Alcohol/Substance Abuse/Dependencies  Cognitive Features That Contribute To Risk:  Closed-mindedness    Suicide Risk:  Minimal: No identifiable suicidal ideation.  Patients presenting with no risk factors but with morbid ruminations; may be classified as minimal risk based on the severity of the depressive symptoms   Plan Of Care/Follow-up recommendations:  Activity:  as tolerated Diet:  Heart healthy  Laveda Abbe, NP 11/01/2017, 12:40 PM

## 2017-11-09 ENCOUNTER — Encounter (HOSPITAL_COMMUNITY): Payer: Self-pay

## 2017-11-09 ENCOUNTER — Other Ambulatory Visit: Payer: Self-pay

## 2017-11-09 ENCOUNTER — Emergency Department (HOSPITAL_COMMUNITY)
Admission: EM | Admit: 2017-11-09 | Discharge: 2017-11-09 | Disposition: A | Discharge status: Home / Self Care | Payer: Self-pay | Attending: Emergency Medicine | Admitting: Emergency Medicine

## 2017-11-09 ENCOUNTER — Emergency Department (HOSPITAL_COMMUNITY)
Admission: EM | Admit: 2017-11-09 | Discharge: 2017-11-11 | Disposition: A | Discharge status: Home / Self Care | Payer: Self-pay | Attending: Emergency Medicine | Admitting: Emergency Medicine

## 2017-11-09 DIAGNOSIS — Z87891 Personal history of nicotine dependence: Secondary | ICD-10-CM | POA: Insufficient documentation

## 2017-11-09 DIAGNOSIS — R1010 Upper abdominal pain, unspecified: Secondary | ICD-10-CM | POA: Insufficient documentation

## 2017-11-09 DIAGNOSIS — F332 Major depressive disorder, recurrent severe without psychotic features: Secondary | ICD-10-CM

## 2017-11-09 DIAGNOSIS — F329 Major depressive disorder, single episode, unspecified: Secondary | ICD-10-CM | POA: Insufficient documentation

## 2017-11-09 DIAGNOSIS — R45851 Suicidal ideations: Secondary | ICD-10-CM | POA: Insufficient documentation

## 2017-11-09 DIAGNOSIS — N189 Chronic kidney disease, unspecified: Secondary | ICD-10-CM | POA: Insufficient documentation

## 2017-11-09 DIAGNOSIS — F39 Unspecified mood [affective] disorder: Secondary | ICD-10-CM | POA: Insufficient documentation

## 2017-11-09 DIAGNOSIS — Z79899 Other long term (current) drug therapy: Secondary | ICD-10-CM | POA: Insufficient documentation

## 2017-11-09 DIAGNOSIS — I129 Hypertensive chronic kidney disease with stage 1 through stage 4 chronic kidney disease, or unspecified chronic kidney disease: Secondary | ICD-10-CM | POA: Insufficient documentation

## 2017-11-09 DIAGNOSIS — F333 Major depressive disorder, recurrent, severe with psychotic symptoms: Secondary | ICD-10-CM | POA: Insufficient documentation

## 2017-11-09 DIAGNOSIS — J45909 Unspecified asthma, uncomplicated: Secondary | ICD-10-CM | POA: Insufficient documentation

## 2017-11-09 DIAGNOSIS — E109 Type 1 diabetes mellitus without complications: Secondary | ICD-10-CM | POA: Insufficient documentation

## 2017-11-09 DIAGNOSIS — Z905 Acquired absence of kidney: Secondary | ICD-10-CM | POA: Insufficient documentation

## 2017-11-09 DIAGNOSIS — Z8673 Personal history of transient ischemic attack (TIA), and cerebral infarction without residual deficits: Secondary | ICD-10-CM | POA: Insufficient documentation

## 2017-11-09 DIAGNOSIS — I10 Essential (primary) hypertension: Secondary | ICD-10-CM | POA: Insufficient documentation

## 2017-11-09 DIAGNOSIS — Z7984 Long term (current) use of oral hypoglycemic drugs: Secondary | ICD-10-CM | POA: Insufficient documentation

## 2017-11-09 LAB — RAPID URINE DRUG SCREEN, HOSP PERFORMED
Amphetamines: NOT DETECTED
BARBITURATES: NOT DETECTED
Benzodiazepines: NOT DETECTED
Cocaine: NOT DETECTED
OPIATES: NOT DETECTED
TETRAHYDROCANNABINOL: NOT DETECTED

## 2017-11-09 LAB — COMPREHENSIVE METABOLIC PANEL
ALBUMIN: 3.8 g/dL (ref 3.5–5.0)
ALK PHOS: 61 U/L (ref 38–126)
ALT: 32 U/L (ref 0–44)
AST: 19 U/L (ref 15–41)
Anion gap: 8 (ref 5–15)
BUN: 17 mg/dL (ref 6–20)
CHLORIDE: 109 mmol/L (ref 98–111)
CO2: 22 mmol/L (ref 22–32)
CREATININE: 1.14 mg/dL (ref 0.61–1.24)
Calcium: 9.5 mg/dL (ref 8.9–10.3)
GFR calc Af Amer: >60 mL/min (ref >60)
GFR calc non Af Amer: >60 mL/min (ref >60)
GLUCOSE: 104 mg/dL — AB (ref 70–99)
Potassium: 4.3 mmol/L (ref 3.5–5.1)
SODIUM: 139 mmol/L (ref 135–145)
Total Bilirubin: 0.3 mg/dL (ref 0.3–1.2)
Total Protein: 7 g/dL (ref 6.5–8.1)

## 2017-11-09 LAB — CBC
HCT: 41 % (ref 39.0–52.0)
HEMOGLOBIN: 14.3 g/dL (ref 13.0–17.0)
MCH: 30.2 pg (ref 26.0–34.0)
MCHC: 34.9 g/dL (ref 30.0–36.0)
MCV: 86.7 fL (ref 78.0–100.0)
PLATELETS: 379 10*3/uL (ref 150–400)
RBC: 4.73 MIL/uL (ref 4.22–5.81)
RDW: 13.1 % (ref 11.5–15.5)
WBC: 7.6 10*3/uL (ref 4.0–10.5)

## 2017-11-09 LAB — LIPASE, BLOOD: Lipase: 45 U/L (ref 11–51)

## 2017-11-09 MED ORDER — QUETIAPINE FUMARATE 25 MG PO TABS
50.00 | ORAL_TABLET | ORAL | Status: DC
Start: 2017-11-09 — End: 2017-11-09

## 2017-11-09 MED ORDER — DICYCLOMINE HCL 20 MG PO TABS
20.00 | ORAL_TABLET | ORAL | Status: DC
Start: 2017-11-09 — End: 2017-11-09

## 2017-11-09 MED ORDER — FLUOXETINE HCL 20 MG PO CAPS
40.00 | ORAL_CAPSULE | ORAL | Status: DC
Start: 2017-11-10 — End: 2017-11-09

## 2017-11-09 MED ORDER — IBUPROFEN 400 MG PO TABS
400.00 | ORAL_TABLET | ORAL | Status: DC
Start: ? — End: 2017-11-09

## 2017-11-09 MED ORDER — HYDROXYZINE HCL 25 MG PO TABS
25.00 | ORAL_TABLET | ORAL | Status: DC
Start: ? — End: 2017-11-09

## 2017-11-09 MED ORDER — DIVALPROEX SODIUM 500 MG PO DR TAB
1500.00 | DELAYED_RELEASE_TABLET | ORAL | Status: DC
Start: 2017-11-09 — End: 2017-11-09

## 2017-11-09 MED ORDER — GABAPENTIN 400 MG PO CAPS
400.00 | ORAL_CAPSULE | ORAL | Status: DC
Start: 2017-11-09 — End: 2017-11-09

## 2017-11-09 MED ORDER — NITROGLYCERIN 0.4 MG SL SUBL
0.40 | SUBLINGUAL_TABLET | SUBLINGUAL | Status: DC
Start: ? — End: 2017-11-09

## 2017-11-09 NOTE — Discharge Instructions (Signed)
It was our pleasure to provide your ER care today - we hope that you feel better.  Your lab tests looks good/normal.   If gi symptoms, you may try taking pepcid and/or maalox as need. Avoid alcohol use.   For mental health issues and/or crisis, go directly to [facility].  Return to ER if worse, new symptoms, fevers, persistent vomiting, new or severe pain, other concern.

## 2017-11-09 NOTE — ED Triage Notes (Signed)
He tells me he is currently a pt. At [facility]. He further states "I have to be there by 8:30 in the morning, but I can't seem to get there on time".

## 2017-11-09 NOTE — ED Triage Notes (Signed)
Pt BIB EMS c/o sudden generalized abd pain x1 hr. No N/V/D. EMS reports patient attempted to check into [facility] this morning and was instructed to come back tomorrow at 8am, as they would not take him today., Reports "still being suicidal." Hs recent SI ideations + substance abuse.

## 2017-11-09 NOTE — ED Notes (Signed)
Patient belongings placed in locker #26. 

## 2017-11-09 NOTE — ED Provider Notes (Signed)
[facility]-EMERGENCY DEPT Provider Note   CSN: [id number] Arrival date & time: 11/09/17  1323     History   Chief Complaint Chief Complaint  Patient presents with  . Suicidal  . Abdominal Pain    HPI Barry Daugherty is a 47 y.o. male.  Patient c/o hx anxiety, bipolar, etoh abuse, presents c/o upper abd/epigastric pain. Onset in past day, gradual, moderate, persistent, non radiating, without specific exacerbating or alleviating factors. No associated vomiting or diarrhea. Having normal bms. No abd distension. No fever or chills. No dysuria or gu c/o. Notes hx pancreatitis. Denies hx pud. Hx cholecystectomy. Denies fall or abd trauma.   The history is provided by the patient and the EMS personnel.  Abdominal Pain   Pertinent negatives include fever, diarrhea, vomiting, dysuria and headaches.    Past Medical History:  Diagnosis Date  . Anxiety   . Asthma   . Bipolar 1 disorder (HCC)   . Brain injury (HCC)   . Chronic kidney disease    nephrectomy 1975  . Depression   . Grand mal seizure [facility])    "controlled w/RX" (10/28/2017)  . Headache    "couple/week" (10/28/2017)  . High cholesterol   . Hypertension   . Migraine    "couple/year" (10/28/2017)  . Myocardial infarction (HCC) 04/2017  . Sleep apnea    "suppose to have CPAP; I don't have one" (10/28/2017)  . Stroke [facility]) 03/2017   "left side is weaker since" (10/28/2017)  . Type I diabetes mellitus (HCC)    "dx'd 2005; can't afford insulin so I use pills" (10/28/2017)    Patient Active Problem List   Diagnosis Date Noted  . Adjustment disorder with mixed anxiety and depressed mood 11/01/2017  . Chest pain 10/28/2017  . MDD (major depressive disorder), recurrent severe, without psychosis (HCC) 10/15/2017  . MDD (major depressive disorder) 04/12/2015  . Substance induced mood disorder (HCC) 04/04/2015  . Severe episode of recurrent major depressive disorder, without psychotic features (HCC)   . GAD  (generalized anxiety disorder)     Past Surgical History:  Procedure Laterality Date  . CHOLECYSTECTOMY OPEN    . NEPHRECTOMY Left 1975        Home Medications    Prior to Admission medications   Medication Sig Start Date End Date Taking? Authorizing Provider  amLODipine (NORVASC) 10 MG tablet Take 1 tablet (10 mg total) by mouth daily. For high blood pressure 10/30/17  Yes Regalado, Belkys A, MD  atenolol (TENORMIN) 50 MG tablet Take 1 tablet (50 mg total) by mouth daily. For high blood pressure 10/30/17  Yes Regalado, Belkys A, MD  busPIRone (BUSPAR) 15 MG tablet Take 1 tablet (15 mg total) by mouth 2 (two) times daily. For anxiety 10/27/17  Yes Armandina Stammer I, NP  divalproex (DEPAKOTE ER) 500 MG 24 hr tablet Take 2 tablets (1,000 mg total) by mouth at bedtime. For mood stabilization 10/27/17  Yes Nwoko, Nicole Kindred I, NP  FLUoxetine (PROZAC) 40 MG capsule Take 1 capsule (40 mg total) by mouth daily. For depression 10/28/17  Yes Armandina Stammer I, NP  gabapentin (NEURONTIN) 300 MG capsule Take 1 capsule (300 mg total) by mouth 4 (four) times daily -  with meals and at bedtime. For agitation 10/27/17  Yes Armandina Stammer I, NP  hydrOXYzine (ATARAX/VISTARIL) 25 MG tablet Take 1 tablet (25 mg total) by mouth 3 (three) times daily as needed for anxiety. 10/27/17  Yes Armandina Stammer I, NP  lamoTRIgine (LAMICTAL) 25 MG  tablet Take 1 tablet (25 mg total) by mouth 2 (two) times daily. For mood stabilization 10/27/17  Yes Nwoko, Nicole Kindred I, NP  metFORMIN (GLUCOPHAGE) 500 MG tablet Take 500 mg by mouth daily with breakfast.   Yes [provider]  mirtazapine (REMERON) 15 MG tablet Take 1 tablet (15 mg total) by mouth at bedtime. For depression/sleep 10/27/17  Yes Armandina Stammer I, NP  pantoprazole (PROTONIX) 40 MG tablet Take 1 tablet (40 mg total) by mouth 2 (two) times daily. 10/30/17  Yes Regalado, Belkys A, MD  acetaminophen (TYLENOL) 325 MG tablet Take 2 tablets (650 mg total) by mouth every 4 (four) hours as  needed for headache or mild pain. 10/30/17   Regalado, Belkys A, MD  albuterol (PROVENTIL HFA;VENTOLIN HFA) 108 (90 Base) MCG/ACT inhaler Inhale 1-2 puffs into the lungs every 6 (six) hours as needed for wheezing or shortness of breath. 10/30/17   Regalado, Belkys A, MD  cyclobenzaprine (FLEXERIL) 5 MG tablet Take 1 tablet (5 mg total) by mouth 3 (three) times daily as needed for muscle spasms. 10/27/17   Sanjuana Kava, NP    Family History Family History  Problem Relation Age of Onset  . Healthy Mother   . Heart attack Father 61  . Pancreatic cancer Father   . Healthy Sister   . Healthy Brother     Social History Social History   Tobacco Use  . Smoking status: Former Smoker    Packs/day: 0.50    Years: 29.00    Pack years: 14.50    Types: Cigarettes    Last attempt to quit: 04/15/2017    Years since quitting: 0.5  . Smokeless tobacco: Never Used  Substance Use Topics  . Alcohol use: No    Comment: 10/28/2017 "nothing to drink since 2005; I'm at [facility] now"  . Drug use: Yes    Types: Cocaine, Marijuana, Methamphetamines    Comment: 10/28/2017 "clean for 6 months now"     Allergies   Ceftin [cefuroxime axetil]; Reglan [metoclopramide]; and Penicillins   Review of Systems Review of Systems  Constitutional: Negative for fever.  HENT: Negative for sore throat.   Eyes: Negative for redness.  Respiratory: Negative for shortness of breath.   Cardiovascular: Negative for chest pain and leg swelling.  Gastrointestinal: Positive for abdominal pain. Negative for diarrhea and vomiting.  Genitourinary: Negative for dysuria and flank pain.  Musculoskeletal: Negative for back pain and neck pain.  Skin: Negative for rash.  Neurological: Negative for headaches.  Hematological: Does not bruise/bleed easily.  Psychiatric/Behavioral: Negative for suicidal ideas. The patient is nervous/anxious.        Notes hx depression, intermittent feelings of same. Denies thoughts of wanting to  harm self or others.      Physical Exam Updated Vital Signs There were no vitals taken for this visit.  Physical Exam  Constitutional: He appears well-developed and well-nourished.  HENT:  Mouth/Throat: Oropharynx is clear and moist.  Eyes: Conjunctivae are normal. No scleral icterus.  Neck: Neck supple. No tracheal deviation present.  Cardiovascular: Normal rate, regular rhythm, normal heart sounds and intact distal pulses. Exam reveals no gallop and no friction rub.  No murmur heard. Pulmonary/Chest: Effort normal and breath sounds normal. No accessory muscle usage. No respiratory distress.  Abdominal: Soft. Bowel sounds are normal. He exhibits no distension and no mass. There is no tenderness. There is no rebound and no guarding. No hernia.  Genitourinary:  Genitourinary Comments: No cva tenderness  Musculoskeletal: He  exhibits no edema.  Neurological: He is alert.  Speech clear/fluent. Ambulates w steady gait.   Skin: Skin is warm and dry. No rash noted. He is not diaphoretic.  Psychiatric: He has a normal mood and affect.  Nursing note and vitals reviewed.    ED Treatments / Results  Labs (all labs ordered are listed, but only abnormal results are displayed) Results for orders placed or performed during the hospital encounter of 11/09/17  CBC  Result Value Ref Range   WBC 7.6 4.0 - 10.5 K/uL   RBC 4.73 4.22 - 5.81 MIL/uL   Hemoglobin 14.3 13.0 - 17.0 g/dL   HCT 18.8 41.6 - 60.6 %   MCV 86.7 78.0 - 100.0 fL   MCH 30.2 26.0 - 34.0 pg   MCHC 34.9 30.0 - 36.0 g/dL   RDW 30.1 60.1 - 09.3 %   Platelets 379 150 - 400 K/uL  Comprehensive metabolic panel  Result Value Ref Range   Sodium 139 135 - 145 mmol/L   Potassium 4.3 3.5 - 5.1 mmol/L   Chloride 109 98 - 111 mmol/L   CO2 22 22 - 32 mmol/L   Glucose, Bld 104 (H) 70 - 99 mg/dL   BUN 17 6 - 20 mg/dL   Creatinine, Ser 2.35 0.61 - 1.24 mg/dL   Calcium 9.5 8.9 - 57.3 mg/dL   Total Protein 7.0 6.5 - 8.1 g/dL   Albumin  3.8 3.5 - 5.0 g/dL   AST 19 15 - 41 U/L   ALT 32 0 - 44 U/L   Alkaline Phosphatase 61 38 - 126 U/L   Total Bilirubin 0.3 0.3 - 1.2 mg/dL   GFR calc non Af Amer >60 >60 mL/min   GFR calc Af Amer >60 >60 mL/min   Anion gap 8 5 - 15  Lipase, blood  Result Value Ref Range   Lipase 45 11 - 51 U/L  Rapid urine drug screen (hospital performed)  Result Value Ref Range   Opiates NONE DETECTED NONE DETECTED   Cocaine NONE DETECTED NONE DETECTED   Benzodiazepines NONE DETECTED NONE DETECTED   Amphetamines NONE DETECTED NONE DETECTED   Tetrahydrocannabinol NONE DETECTED NONE DETECTED   Barbiturates NONE DETECTED NONE DETECTED   Dg Chest 2 View  Result Date: 10/28/2017 CLINICAL DATA:  Chest pain. EXAM: CHEST - 2 VIEW COMPARISON:  10/22/2017. FINDINGS: Mild prominence of superior mediastinum noted. This may be from slight rotation and AP projection. PA and lateral chest x-ray suggested for further evaluation. Hilar structures normal. Heart size normal. Low lung volumes with mild bibasilar atelectasis. No pleural effusion or pneumothorax. No acute bony abnormality. IMPRESSION: Mild prominence of superior mediastinum noted. This may be from slight rotation and AP projection. PA lateral chest x-ray suggested for further evaluation. 2.  Low lung volumes with mild bibasilar atelectasis. Electronically Signed   By: Maisie Fus  Register   On: 10/28/2017 10:22   Dg Chest 2 View  Result Date: 10/22/2017 CLINICAL DATA:  Chest pain, shortness of breath. EXAM: CHEST - 2 VIEW COMPARISON:  None. FINDINGS: The heart size and mediastinal contours are within normal limits. Both lungs are clear. No pneumothorax or pleural effusion is noted. The visualized skeletal structures are unremarkable. IMPRESSION: No active cardiopulmonary disease. Electronically Signed   By: Lupita Raider, M.D.   On: 10/22/2017 20:14   Ct Abdomen Pelvis W Contrast  Result Date: 10/31/2017 CLINICAL DATA:  47 year old male with left upper quadrant  abdominal pain for 3 days. EXAM: CT ABDOMEN  AND PELVIS WITH CONTRAST TECHNIQUE: Multidetector CT imaging of the abdomen and pelvis was performed using the standard protocol following bolus administration of intravenous contrast. CONTRAST:  OMNIPAQUE IOHEXOL 300 MG/ML  SOLN COMPARISON:  Coronary CTA 10/30/2017. [facility] Abdomen and Pelvis 06/08/2014. FINDINGS: Lower chest: Negative; mild dependent atelectasis. No pericardial or pleural effusion. Hepatobiliary: Surgically absent gallbladder. Chronic decreased density throughout the liver in keeping with a degree of steatosis. Otherwise normal liver enhancement. Pancreas: Negative. Spleen: Normal spleen. Adrenals/Urinary Tract: Normal adrenal glands. Surgically absent left kidney with multiple left renal space surgical clips. There is a small associated 15 millimeter soft tissue nodule situated among clips adjacent to the left psoas muscle (series 3, image 42 and coronal image 60). This is unchanged since 2016. Normal right renal enhancement and contrast excretion on delayed images. Negative right ureter. Excreted IV contrast within the urinary bladder which otherwise appears normal. Stomach/Bowel: Negative rectum. Redundant but otherwise negative sigmoid colon. Negative descending colon and splenic flexure. The hepatic flexure is redundant, otherwise negative transverse colon. Negative right colon and appendix (series 3, image 68). Negative terminal ileum. Intermittent fluid-filled but nondilated small bowel loops. Decompressed stomach and duodenum. No abdominal free air, free fluid. Vascular/Lymphatic: Reduced IV contrast dosed utilized due to CTA contrast yesterday and solitary kidney. The major arterial structures appear patent. Portal venous system is patent. No lymphadenopathy. Reproductive: Negative. Other: No pelvic free fluid. Musculoskeletal: No acute osseous abnormality identified. IMPRESSION: 1. No acute or inflammatory process  identified in the abdomen or pelvis. 2. Stable CT appearance of the abdomen since 2016: Chronic hepatic steatosis. Stable sequelae of left nephrectomy. Electronically Signed   By: Odessa Fleming M.D.   On: 10/31/2017 08:02   Ct Coronary Morph W/cta Cor W/score W/ca W/cm &/or Wo/cm  Addendum Date: 10/30/2017   ADDENDUM REPORT: 10/30/2017 14:12 CLINICAL DATA:  47 year old male with atypical chest pain and h/o etoh abuse. EXAM: Cardiac/Coronary  CT TECHNIQUE: The patient was scanned on a [employer]. FINDINGS: A 120 kV prospective scan was triggered in the descending thoracic aorta at 111 HU's. Axial non-contrast 3 mm slices were carried out through the heart. The data set was analyzed on a dedicated work station and scored using the Agatson method. Gantry rotation speed was 250 msecs and collimation was .6 mm. No beta blockade and 0.8 mg of sl NTG was given. The 3D data set was reconstructed in 5% intervals of the 67-82 % of the R-R cycle. Diastolic phases were analyzed on a dedicated work station using MPR, MIP and VRT modes. The patient received 80 cc of contrast. Aorta:  Normal size.  No calcifications.  No dissection. Aortic Valve:  Trileaflet.  No calcifications. Coronary Arteries:  Normal coronary origin.  Right dominance. RCA is a large dominant artery that gives rise to PDA and PLA. There is no plaque. Left main is a large artery that gives rise to LAD, ramus intermedius and LCX arteries. LAD is a large vessel that has no plaque. RI is a large artery that has no plaque. LCX is a small non-dominant artery that has no plaque. Other findings: Normal pulmonary vein drainage into the left atrium. Normal let atrial appendage without a thrombus. Normal size of the pulmonary artery. IMPRESSION: 1. Coronary calcium score of 0. This was 0 percentile for age and sex matched control. 2. Normal coronary origin with right dominance. 3. No evidence of CAD.  Consider non-cardiac causes of chest pain. Electronically  Signed  By: Tobias Alexander   On: 10/30/2017 14:12   Result Date: 10/30/2017 EXAM: OVER-READ INTERPRETATION  CT CHEST The following report is an over-read performed by radiologist Dr. Trudie Reed of [facility] on 10/30/2017. This over-read does not include interpretation of cardiac or coronary anatomy or pathology. The coronary calcium score/coronary CTA interpretation by the cardiologist is attached. COMPARISON:  None. FINDINGS: Within the visualized portions of the thorax there are no suspicious appearing pulmonary nodules or masses, there is no acute consolidative airspace disease, no pleural effusions, no pneumothorax and no lymphadenopathy. Visualized portions of the upper abdomen demonstrates mild diffuse low attenuation throughout the visualized hepatic parenchyma, indicative of hepatic steatosis. There are no aggressive appearing lytic or blastic lesions noted in the visualized portions of the skeleton. IMPRESSION: 1. Hepatic steatosis. Electronically Signed: By: Trudie Reed M.D. On: 10/30/2017 11:04    EKG None  Radiology No results found.  Procedures Procedures (including critical care time)  Medications Ordered in ED Medications - No data to display   Initial Impression / Assessment and Plan / ED Course  I have reviewed the triage vital signs and the nursing notes.  Pertinent labs & imaging results that were available during my care of the patient were reviewed by me and considered in my medical decision making (see chart for details).  Labs sent.  On arrival, despite c/o of abd pain, pt is asking for food and drink, and had eaten sandwich and 2 cokes prior to my evaluation.  Reviewed nursing notes and prior charts for additional history.   Pt exhibits normal mood and affect, he does not voice any thoughts of harm to self or others. No SI.   Abdominal exam is benign, soft nt.   Labs reviewed - lipase/chem normal.  Pt currently appears stable for  d/c.     Final Clinical Impressions(s) / ED Diagnoses   Final diagnoses:  None    ED Discharge Orders    None       Cathren Laine, MD 11/09/17 1740

## 2017-11-09 NOTE — ED Notes (Signed)
Bed: [facility]3 Expected date:  Expected time:  Means of arrival:  Comments: 

## 2017-11-10 ENCOUNTER — Other Ambulatory Visit: Payer: Self-pay

## 2017-11-10 ENCOUNTER — Encounter (HOSPITAL_COMMUNITY): Payer: Self-pay | Admitting: Emergency Medicine

## 2017-11-10 DIAGNOSIS — F332 Major depressive disorder, recurrent severe without psychotic features: Secondary | ICD-10-CM

## 2017-11-10 LAB — CBG MONITORING, ED
GLUCOSE-CAPILLARY: 96 mg/dL (ref 70–99)
GLUCOSE-CAPILLARY: 130 mg/dL — AB (ref 70–99)

## 2017-11-10 LAB — COMPREHENSIVE METABOLIC PANEL
ALT: 31 U/L (ref 0–44)
AST: 22 U/L (ref 15–41)
Albumin: 3.6 g/dL (ref 3.5–5.0)
Alkaline Phosphatase: 65 U/L (ref 38–126)
Anion gap: 11 (ref 5–15)
BILIRUBIN TOTAL: 0.3 mg/dL (ref 0.3–1.2)
BUN: 18 mg/dL (ref 6–20)
CO2: 21 mmol/L — ABNORMAL LOW (ref 22–32)
CREATININE: 1.27 mg/dL — AB (ref 0.61–1.24)
Calcium: 9.2 mg/dL (ref 8.9–10.3)
Chloride: 105 mmol/L (ref 98–111)
Glucose, Bld: 133 mg/dL — ABNORMAL HIGH (ref 70–99)
POTASSIUM: 3.8 mmol/L (ref 3.5–5.1)
Sodium: 137 mmol/L (ref 135–145)
TOTAL PROTEIN: 6.6 g/dL (ref 6.5–8.1)

## 2017-11-10 LAB — CBC
HCT: 42.4 % (ref 39.0–52.0)
Hemoglobin: 14.1 g/dL (ref 13.0–17.0)
MCH: 29.7 pg (ref 26.0–34.0)
MCHC: 33.3 g/dL (ref 30.0–36.0)
MCV: 89.3 fL (ref 78.0–100.0)
PLATELETS: 397 10*3/uL (ref 150–400)
RBC: 4.75 MIL/uL (ref 4.22–5.81)
RDW: 12.7 % (ref 11.5–15.5)
WBC: 9.4 10*3/uL (ref 4.0–10.5)

## 2017-11-10 LAB — RAPID URINE DRUG SCREEN, HOSP PERFORMED
AMPHETAMINES: NOT DETECTED
BENZODIAZEPINES: NOT DETECTED
Barbiturates: NOT DETECTED
Cocaine: NOT DETECTED
OPIATES: NOT DETECTED
Tetrahydrocannabinol: NOT DETECTED

## 2017-11-10 LAB — SALICYLATE LEVEL: Salicylate Lvl: <7 mg/dL (ref 2.8–30.0)

## 2017-11-10 LAB — ACETAMINOPHEN LEVEL: Acetaminophen (Tylenol), Serum: <10 ug/mL — ABNORMAL LOW (ref 10–30)

## 2017-11-10 LAB — ETHANOL

## 2017-11-10 MED ORDER — DIVALPROEX SODIUM ER 500 MG PO TB24
1000.0000 mg | ORAL_TABLET | Freq: Every day | ORAL | Status: DC
  Administered 2017-11-10: 1000 mg via ORAL
  Filled 2017-11-10: qty 2

## 2017-11-10 MED ORDER — FLUOXETINE HCL 20 MG PO CAPS
40.0000 mg | ORAL_CAPSULE | Freq: Every day | ORAL | Status: DC
  Administered 2017-11-10 – 2017-11-11 (×2): 40 mg via ORAL
  Filled 2017-11-10 (×2): qty 2

## 2017-11-10 MED ORDER — ACETAMINOPHEN 325 MG PO TABS: 650.0000 mg | ORAL_TABLET | ORAL | Status: DC | PRN

## 2017-11-10 MED ORDER — ATENOLOL 25 MG PO TABS
50.0000 mg | ORAL_TABLET | Freq: Every day | ORAL | Status: DC
  Administered 2017-11-10 – 2017-11-11 (×2): 50 mg via ORAL
  Filled 2017-11-10: qty 1
  Filled 2017-11-10: qty 2

## 2017-11-10 MED ORDER — AMLODIPINE BESYLATE 5 MG PO TABS
10.0000 mg | ORAL_TABLET | Freq: Every day | ORAL | Status: DC
  Administered 2017-11-10 – 2017-11-11 (×2): 10 mg via ORAL
  Filled 2017-11-10 (×2): qty 2

## 2017-11-10 MED ORDER — LAMOTRIGINE 25 MG PO TABS
25.0000 mg | ORAL_TABLET | Freq: Two times a day (BID) | ORAL | Status: DC
  Administered 2017-11-10 – 2017-11-11 (×3): 25 mg via ORAL
  Filled 2017-11-10 (×3): qty 1

## 2017-11-10 MED ORDER — PANTOPRAZOLE SODIUM 40 MG PO TBEC
40.0000 mg | DELAYED_RELEASE_TABLET | Freq: Two times a day (BID) | ORAL | Status: DC
  Administered 2017-11-10 – 2017-11-11 (×3): 40 mg via ORAL
  Filled 2017-11-10 (×3): qty 1

## 2017-11-10 MED ORDER — BUSPIRONE HCL 10 MG PO TABS
15.0000 mg | ORAL_TABLET | Freq: Two times a day (BID) | ORAL | Status: DC
  Administered 2017-11-10 – 2017-11-11 (×3): 15 mg via ORAL
  Filled 2017-11-10 (×3): qty 2

## 2017-11-10 MED ORDER — METFORMIN HCL 500 MG PO TABS
500.0000 mg | ORAL_TABLET | Freq: Every day | ORAL | Status: DC
  Administered 2017-11-10 – 2017-11-11 (×2): 500 mg via ORAL
  Filled 2017-11-10 (×2): qty 1

## 2017-11-10 MED ORDER — ALBUTEROL SULFATE HFA 108 (90 BASE) MCG/ACT IN AERS: 1.0000 | INHALATION_SPRAY | Freq: Four times a day (QID) | RESPIRATORY_TRACT | Status: DC | PRN

## 2017-11-10 MED ORDER — GABAPENTIN 300 MG PO CAPS
300.0000 mg | ORAL_CAPSULE | Freq: Three times a day (TID) | ORAL | Status: DC
  Administered 2017-11-10 – 2017-11-11 (×4): 300 mg via ORAL
  Filled 2017-11-10 (×5): qty 1

## 2017-11-10 MED ORDER — HYDROXYZINE HCL 25 MG PO TABS: 25.0000 mg | ORAL_TABLET | Freq: Three times a day (TID) | ORAL | Status: DC | PRN

## 2017-11-10 MED ORDER — MIRTAZAPINE 15 MG PO TABS
15.0000 mg | ORAL_TABLET | Freq: Every day | ORAL | Status: DC
  Administered 2017-11-10: 15 mg via ORAL
  Filled 2017-11-10: qty 1

## 2017-11-10 NOTE — Progress Notes (Addendum)
Pt. meets criteria for inpatient treatment per [name], PMHNP.  Referred out to the following hospitals: [facility]  [facility]  [facility]  [facility]  [facility]  [facility]  [facility]  [facility]  [facility]  [facility]      Disposition CSW will continue to follow for placement.  Timmothy Euler. Kaylyn Lim, MSW, LCSWA Disposition Clinical Social Work [phone] (cell) [phone] (office)

## 2017-11-10 NOTE — ED Notes (Signed)
Lunch Tray Ordered @ 1105-per RN-call by Marylene Land

## 2017-11-10 NOTE — ED Notes (Signed)
TTS done 
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
## 2017-11-10 NOTE — ED Notes (Signed)
Plan of care per TTS is to re-evaluate in the morning.

## 2017-11-10 NOTE — ED Provider Notes (Signed)
[facility] EMERGENCY DEPARTMENT Provider Note   CSN: [id number] Arrival date & time: 11/09/17  2352     History   Chief Complaint Chief Complaint  Patient presents with  . Suicidal    HPI Barry Daugherty is a 47 y.o. male.   47 year old male with a history of dyslipidemia, hypertension, CVA, diabetes, seizures presents to the emergency department for suicidal ideations.  [place]  He was awoken by security and told to leave, when he began complaining of suicidal thoughts.  He states that he has been having thoughts for 1 week.  He has been largely noncompliant with his medications, though he states he started taking them again 4 days ago.  He is currently homeless.  He does not express any suicidal plan during my encounter with him.  No homicidal ideations or reported substance use.  Patient has not followed up with an outpatient psychiatric provider for at least 1 month.     Past Medical History:  Diagnosis Date  . Anxiety   . Asthma   . Bipolar 1 disorder (HCC)   . Brain injury (HCC)   . Chronic kidney disease    nephrectomy 1975  . Depression   . Grand mal seizure [facility])    "controlled w/RX" (10/28/2017)  . Headache    "couple/week" (10/28/2017)  . High cholesterol   . Hypertension   . Migraine    "couple/year" (10/28/2017)  . Myocardial infarction (HCC) 04/2017  . Sleep apnea    "suppose to have CPAP; I don't have one" (10/28/2017)  . Stroke [facility]) 03/2017   "left side is weaker since" (10/28/2017)  . Type I diabetes mellitus (HCC)    "dx'd 2005; can't afford insulin so I use pills" (10/28/2017)    Patient Active Problem List   Diagnosis Date Noted  . Adjustment disorder with mixed anxiety and depressed mood 11/01/2017  . Chest pain 10/28/2017  . MDD (major depressive disorder), recurrent severe, without psychosis (HCC) 10/15/2017  . MDD (major depressive disorder) 04/12/2015  . Substance induced mood disorder (HCC)  04/04/2015  . Severe episode of recurrent major depressive disorder, without psychotic features (HCC)   . GAD (generalized anxiety disorder)     Past Surgical History:  Procedure Laterality Date  . CHOLECYSTECTOMY OPEN    . NEPHRECTOMY Left 1975        Home Medications    Prior to Admission medications   Medication Sig Start Date End Date Taking? Authorizing Provider  acetaminophen (TYLENOL) 325 MG tablet Take 2 tablets (650 mg total) by mouth every 4 (four) hours as needed for headache or mild pain. 10/30/17  Yes Regalado, Belkys A, MD  albuterol (PROVENTIL HFA;VENTOLIN HFA) 108 (90 Base) MCG/ACT inhaler Inhale 1-2 puffs into the lungs every 6 (six) hours as needed for wheezing or shortness of breath. 10/30/17  Yes Regalado, Belkys A, MD  amLODipine (NORVASC) 10 MG tablet Take 1 tablet (10 mg total) by mouth daily. For high blood pressure 10/30/17  Yes Regalado, Belkys A, MD  atenolol (TENORMIN) 50 MG tablet Take 1 tablet (50 mg total) by mouth daily. For high blood pressure 10/30/17  Yes Regalado, Belkys A, MD  busPIRone (BUSPAR) 15 MG tablet Take 1 tablet (15 mg total) by mouth 2 (two) times daily. For anxiety 10/27/17  Yes Nwoko, Nicole Kindred I, NP  cyclobenzaprine (FLEXERIL) 5 MG tablet Take 1 tablet (5 mg total) by mouth 3 (three) times daily as needed for muscle spasms. 10/27/17  Yes Armandina Stammer I, NP  divalproex (DEPAKOTE ER) 500 MG 24 hr tablet Take 2 tablets (1,000 mg total) by mouth at bedtime. For mood stabilization 10/27/17  Yes Nwoko, Nicole Kindred I, NP  FLUoxetine (PROZAC) 40 MG capsule Take 1 capsule (40 mg total) by mouth daily. For depression 10/28/17  Yes Armandina Stammer I, NP  gabapentin (NEURONTIN) 300 MG capsule Take 1 capsule (300 mg total) by mouth 4 (four) times daily -  with meals and at bedtime. For agitation 10/27/17  Yes Armandina Stammer I, NP  hydrOXYzine (ATARAX/VISTARIL) 25 MG tablet Take 1 tablet (25 mg total) by mouth 3 (three) times daily as needed for anxiety. 10/27/17  Yes Armandina Stammer I, NP  lamoTRIgine (LAMICTAL) 25 MG tablet Take 1 tablet (25 mg total) by mouth 2 (two) times daily. For mood stabilization 10/27/17  Yes Nwoko, Nicole Kindred I, NP  metFORMIN (GLUCOPHAGE) 500 MG tablet Take 500 mg by mouth daily with breakfast.   Yes [provider]  mirtazapine (REMERON) 15 MG tablet Take 1 tablet (15 mg total) by mouth at bedtime. For depression/sleep 10/27/17  Yes Armandina Stammer I, NP  pantoprazole (PROTONIX) 40 MG tablet Take 1 tablet (40 mg total) by mouth 2 (two) times daily. 10/30/17  Yes Regalado, Prentiss Bells, MD    Family History Family History  Problem Relation Age of Onset  . Healthy Mother   . Heart attack Father 24  . Pancreatic cancer Father   . Healthy Sister   . Healthy Brother     Social History Social History   Tobacco Use  . Smoking status: Former Smoker    Packs/day: 0.50    Years: 29.00    Pack years: 14.50    Types: Cigarettes    Last attempt to quit: 04/15/2017    Years since quitting: 0.5  . Smokeless tobacco: Never Used  Substance Use Topics  . Alcohol use: No    Comment: 10/28/2017 "nothing to drink since 2005; I'm at [facility] now"  . Drug use: Yes    Types: Cocaine, Marijuana, Methamphetamines     Allergies   Ceftin [cefuroxime axetil]; Reglan [metoclopramide]; and Penicillins   Review of Systems Review of Systems Ten systems reviewed and are negative for acute change, except as noted in the HPI.    Physical Exam Updated Vital Signs BP (!) 143/97 (BP Location: Right Arm)   Pulse 94   Temp 98.4 F (36.9 C) (Oral)   Resp 17   SpO2 95%   Physical Exam  Constitutional: He is oriented to person, place, and time. He appears well-developed and well-nourished. No distress.  Disheveled, in NAD  HENT:  Head: Normocephalic and atraumatic.  Eyes: Conjunctivae and EOM are normal. No scleral icterus.  Neck: Normal range of motion.  Cardiovascular: Normal rate, regular rhythm and intact distal pulses.  Pulmonary/Chest:  Effort normal. No respiratory distress.  Respirations even and unlabored  Musculoskeletal: Normal range of motion.  Neurological: He is alert and oriented to person, place, and time. He exhibits normal muscle tone. Coordination normal.  Skin: Skin is warm and dry. No rash noted. He is not diaphoretic. No erythema. No pallor.  Psychiatric: He has a normal mood and affect. His behavior is normal.  Nursing note and vitals reviewed.    ED Treatments / Results  Labs (all labs ordered are listed, but only abnormal results are displayed) Labs Reviewed  COMPREHENSIVE METABOLIC PANEL - Abnormal; Notable for the following components:      Result Value  CO2 21 (*)    Glucose, Bld 133 (*)    Creatinine, Ser 1.27 (*)    All other components within normal limits  ACETAMINOPHEN LEVEL - Abnormal; Notable for the following components:   Acetaminophen (Tylenol), Serum <10 (*)    All other components within normal limits  ETHANOL  SALICYLATE LEVEL  CBC  RAPID URINE DRUG SCREEN, HOSP PERFORMED    EKG None  Radiology No results found.  Procedures Procedures (including critical care time)  Medications Ordered in ED Medications - No data to display   Initial Impression / Assessment and Plan / ED Course  I have reviewed the triage vital signs and the nursing notes.  Pertinent labs & imaging results that were available during my care of the patient were reviewed by me and considered in my medical decision making (see chart for details).     47 year old male presents to the emergency department for worsening depression.  He expresses suicidal ideation without plan.  The patient has been evaluated by TTS.  They recommend observation with psychiatric evaluation in the morning.  He has been medically cleared.  Disposition to be determined by oncoming ED provider.   Final Clinical Impressions(s) / ED Diagnoses   Final diagnoses:  Severe episode of recurrent major depressive disorder,  without psychotic features [facility])    ED Discharge Orders    None       Antony Madura, PA-C 11/10/17 [id number]    Dione Booze, MD 11/10/17 [phone]

## 2017-11-10 NOTE — ED Notes (Signed)
Report taken from off going RN - care assumed at this time; resting quietly on stretcher with sitter at bedside; awake, alert, oriented; appropriate in conversation; reports continued SI; eating breakfast at this time

## 2017-11-10 NOTE — ED Notes (Signed)
TTS at bedside. 

## 2017-11-10 NOTE — ED Notes (Addendum)
Pt changed into paper scrubs, belongings placed in [address] by Gerilyn Pilgrim, EMT.  Security wanded pt.  Staffing notified of [facility] sitter need.

## 2017-11-10 NOTE — ED Triage Notes (Signed)
[place] and told he had to leave by police.  Pt reports he then started having suicidal thoughts with a plan to overdose on pills.  States he last attempted suicide 5 days ago.
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
## 2017-11-10 NOTE — ED Notes (Signed)
Dinner tray order per [facility]

## 2017-11-11 ENCOUNTER — Encounter (HOSPITAL_COMMUNITY): Payer: Self-pay | Admitting: Registered Nurse

## 2017-11-11 ENCOUNTER — Encounter (HOSPITAL_COMMUNITY): Payer: Self-pay | Admitting: Emergency Medicine

## 2017-11-11 ENCOUNTER — Emergency Department (HOSPITAL_COMMUNITY)
Admission: EM | Admit: 2017-11-11 | Discharge: 2017-11-11 | Disposition: A | Discharge status: Home / Self Care | Payer: Self-pay | Attending: Emergency Medicine | Admitting: Emergency Medicine

## 2017-11-11 ENCOUNTER — Other Ambulatory Visit: Payer: Self-pay

## 2017-11-11 ENCOUNTER — Emergency Department (HOSPITAL_COMMUNITY): Payer: Self-pay

## 2017-11-11 DIAGNOSIS — Z87891 Personal history of nicotine dependence: Secondary | ICD-10-CM | POA: Insufficient documentation

## 2017-11-11 DIAGNOSIS — N189 Chronic kidney disease, unspecified: Secondary | ICD-10-CM | POA: Insufficient documentation

## 2017-11-11 DIAGNOSIS — I129 Hypertensive chronic kidney disease with stage 1 through stage 4 chronic kidney disease, or unspecified chronic kidney disease: Secondary | ICD-10-CM | POA: Insufficient documentation

## 2017-11-11 DIAGNOSIS — Z79899 Other long term (current) drug therapy: Secondary | ICD-10-CM | POA: Insufficient documentation

## 2017-11-11 DIAGNOSIS — J45909 Unspecified asthma, uncomplicated: Secondary | ICD-10-CM | POA: Insufficient documentation

## 2017-11-11 DIAGNOSIS — R072 Precordial pain: Secondary | ICD-10-CM | POA: Insufficient documentation

## 2017-11-11 DIAGNOSIS — E109 Type 1 diabetes mellitus without complications: Secondary | ICD-10-CM | POA: Insufficient documentation

## 2017-11-11 LAB — BASIC METABOLIC PANEL
Anion gap: 8 (ref 5–15)
BUN: 15 mg/dL (ref 6–20)
CALCIUM: 9.1 mg/dL (ref 8.9–10.3)
CO2: 22 mmol/L (ref 22–32)
CREATININE: 1.41 mg/dL — AB (ref 0.61–1.24)
Chloride: 111 mmol/L (ref 98–111)
GFR calc non Af Amer: 59 mL/min — ABNORMAL LOW (ref >60)
Glucose, Bld: 123 mg/dL — ABNORMAL HIGH (ref 70–99)
Potassium: 4 mmol/L (ref 3.5–5.1)
SODIUM: 141 mmol/L (ref 135–145)

## 2017-11-11 LAB — CBC
HCT: 41.6 % (ref 39.0–52.0)
Hemoglobin: 13.6 g/dL (ref 13.0–17.0)
MCH: 29.4 pg (ref 26.0–34.0)
MCHC: 32.7 g/dL (ref 30.0–36.0)
MCV: 90 fL (ref 78.0–100.0)
PLATELETS: 350 10*3/uL (ref 150–400)
RBC: 4.62 MIL/uL (ref 4.22–5.81)
RDW: 12.9 % (ref 11.5–15.5)
WBC: 8.8 10*3/uL (ref 4.0–10.5)

## 2017-11-11 LAB — I-STAT TROPONIN, ED: TROPONIN I, POC: 0 ng/mL (ref 0.00–0.08)

## 2017-11-11 LAB — CBG MONITORING, ED: Glucose-Capillary: 95 mg/dL (ref 70–99)

## 2017-11-11 NOTE — ED Notes (Signed)
Breakfast tray ordered 

## 2017-11-11 NOTE — Discharge Planning (Signed)
[facility] consulted to assist pt with obtaining ID.  EDCM confirmed with [facility]) that [facility] can assist with getting legal documents 8-3 pm Monday through Friday.  Information relayed to EDP and bedside RN.  No further EDCM needs identified at this time.
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
## 2017-11-11 NOTE — ED Notes (Signed)
Patient transported to X-ray 

## 2017-11-11 NOTE — ED Notes (Addendum)
Pt stat he has chest pain c/o radiating to left shoulder. Denies shob. Aching, 5/10 Lungs bilateral clear  Peripheral pulses positive x 4.

## 2017-11-11 NOTE — ED Notes (Signed)
Carb Modified Diet was ordered for Lunch. 

## 2017-11-11 NOTE — ED Notes (Signed)
TTS at [facility]ie  

## 2017-11-11 NOTE — Discharge Instructions (Signed)
Your work-up was reassuring.  The behavioral health team felt you are safe for discharge home from a psychiatric standpoint.  Please follow-up with them as they recommend.  Please follow-up with your primary care physician for further management of your intermittent chest pain and anxiety.  If any symptoms change or worsen, please return to the nearest emergency department.

## 2017-11-11 NOTE — ED Notes (Signed)
Dr. Julieanne Manson at [facility]

## 2017-11-11 NOTE — ED Provider Notes (Signed)
[facility] team called to report that after patient was seen today, he is now psychiatrically cleared to follow-up as an outpatient with [facility].  When I assessed the patient, he reports he was having a little bit of chest pain today.  He reports is a sharp and dull pain in his left chest that does not radiate.  No other associated symptoms.    Patient with EKG.  Chart review shows that patient has had multiple work-ups for chest pain that were reassuring at this and other facility.  Note from no font at 11/07/2017 reported that he had a low risk nuclear stress test with no abnormality within the last 2 months.  1:54 PM Patient will have repeat EKG.  EKG is similar to prior with no concerning findings, anticipate patient will be stable for discharge home to continue with outpatient psychiatric management.  She was reassessed and he reports his chest pain has improved.  He thinks is related to his anxiety of being discharged.  He reports that he will follow-up with a PCP for further management of intermittent chest pain and anxiety.  He reports he will follow-up with psychiatry team for suicidal ideation.    We had a shared decision making conversation about getting a troponin and patient says he would rather go home.  Patient understood return precautions and was discharged in good condition with a plan of care  Clinical Impression: 1. Severe episode of recurrent major depressive disorder, without psychotic features (HCC)   2. Suicidal ideation     Disposition: Discharge  Condition: Good  I have discussed the results, Dx and Tx plan with the pt(& family if present). He/she/they expressed understanding and agree(s) with the plan. Discharge instructions discussed at great length. Strict return precautions discussed and pt &/or family have verbalized understanding of the instructions. No further questions at time of discharge.    New Prescriptions   No medications on file    Follow  Up: [facility] [address] [place] [address] [phone] Schedule an appointment as soon as possible for a visit    [facility] EMERGENCY DEPARTMENT [address] [id number] [address] [place] [address] [phone]        Sahian Kerney, Canary Brim, MD 11/11/17 1520

## 2017-11-11 NOTE — ED Triage Notes (Signed)
Pt via [facility] with c/o of chest pain 8/10 that began around 1900 while on the bus. Pt recently discharged for SI but denies SI now. Pt states hx of panic attacks states this feels similar to those. Received 2 nitros in route with no relief, 8/10 pain on arrival.

## 2017-11-11 NOTE — ED Provider Notes (Signed)
[facility] EMERGENCY DEPARTMENT Provider Note   CSN: [id number] Arrival date & time: 11/11/17  1924     History   Chief Complaint Chief Complaint  Patient presents with  . Chest Pain    HPI Barry Daugherty is a 47 y.o. male.  HPI 47 year old male presents the emergency department with several hours of persistent left-sided chest tightness without pleuritic pain or shortness of breath.  No nausea.  He has had multiple evaluations for chest pain including a CT coronary which was performed in mid July 2019 which demonstrated a calcium score of 0 normal coronary arteries.  No history of gastroesophageal reflux disease.  Reports some mild right-sided abdominal pain which is been persistent for several weeks despite negative CT of his abdomen pelvis on 10/30/2017.  No urinary complaints.  No shortness of breath or productive cough.  No fevers or chills.  Symptoms are mild to moderate in severity.  This is the patient's eighth visit to the emergency department in the past 6 months.  He was discharged from the psychiatric component of this emergency department at 1 PM today.  He is homeless and prefers to live on the street.  He reports he is no longer using crack cocaine.   Past Medical History:  Diagnosis Date  . Anxiety   . Asthma   . Bipolar 1 disorder (HCC)   . Brain injury (HCC)   . Chronic kidney disease    nephrectomy 1975  . Depression   . Grand mal seizure [facility])    "controlled w/RX" (10/28/2017)  . Headache    "couple/week" (10/28/2017)  . High cholesterol   . Hypertension   . Migraine    "couple/year" (10/28/2017)  . Myocardial infarction (HCC) 04/2017  . Sleep apnea    "suppose to have CPAP; I don't have one" (10/28/2017)  . Stroke [facility]) 03/2017   "left side is weaker since" (10/28/2017)  . Type I diabetes mellitus (HCC)    "dx'd 2005; can't afford insulin so I use pills" (10/28/2017)    Patient Active Problem List   Diagnosis Date Noted  . Adjustment  disorder with mixed anxiety and depressed mood 11/01/2017  . Chest pain 10/28/2017  . MDD (major depressive disorder), recurrent severe, without psychosis (HCC) 10/15/2017  . MDD (major depressive disorder) 04/12/2015  . Substance induced mood disorder (HCC) 04/04/2015  . Severe episode of recurrent major depressive disorder, without psychotic features (HCC)   . GAD (generalized anxiety disorder)     Past Surgical History:  Procedure Laterality Date  . CHOLECYSTECTOMY OPEN    . NEPHRECTOMY Left 1975        Home Medications    Prior to Admission medications   Medication Sig Start Date End Date Taking? Authorizing Provider  acetaminophen (TYLENOL) 325 MG tablet Take 2 tablets (650 mg total) by mouth every 4 (four) hours as needed for headache or mild pain. 10/30/17   Regalado, Belkys A, MD  albuterol (PROVENTIL HFA;VENTOLIN HFA) 108 (90 Base) MCG/ACT inhaler Inhale 1-2 puffs into the lungs every 6 (six) hours as needed for wheezing or shortness of breath. 10/30/17   Regalado, Belkys A, MD  amLODipine (NORVASC) 10 MG tablet Take 1 tablet (10 mg total) by mouth daily. For high blood pressure 10/30/17   Regalado, Belkys A, MD  atenolol (TENORMIN) 50 MG tablet Take 1 tablet (50 mg total) by mouth daily. For high blood pressure 10/30/17   Regalado, Belkys A, MD  busPIRone (BUSPAR) 15 MG tablet Take 1  tablet (15 mg total) by mouth 2 (two) times daily. For anxiety 10/27/17   Armandina Stammer I, NP  cyclobenzaprine (FLEXERIL) 5 MG tablet Take 1 tablet (5 mg total) by mouth 3 (three) times daily as needed for muscle spasms. 10/27/17   Armandina Stammer I, NP  divalproex (DEPAKOTE ER) 500 MG 24 hr tablet Take 2 tablets (1,000 mg total) by mouth at bedtime. For mood stabilization 10/27/17   Armandina Stammer I, NP  FLUoxetine (PROZAC) 40 MG capsule Take 1 capsule (40 mg total) by mouth daily. For depression 10/28/17   Armandina Stammer I, NP  gabapentin (NEURONTIN) 300 MG capsule Take 1 capsule (300 mg total) by mouth 4  (four) times daily -  with meals and at bedtime. For agitation 10/27/17   Armandina Stammer I, NP  hydrOXYzine (ATARAX/VISTARIL) 25 MG tablet Take 1 tablet (25 mg total) by mouth 3 (three) times daily as needed for anxiety. 10/27/17   Armandina Stammer I, NP  lamoTRIgine (LAMICTAL) 25 MG tablet Take 1 tablet (25 mg total) by mouth 2 (two) times daily. For mood stabilization 10/27/17   Armandina Stammer I, NP  metFORMIN (GLUCOPHAGE) 500 MG tablet Take 500 mg by mouth daily with breakfast.    [provider]  mirtazapine (REMERON) 15 MG tablet Take 1 tablet (15 mg total) by mouth at bedtime. For depression/sleep 10/27/17   Armandina Stammer I, NP  pantoprazole (PROTONIX) 40 MG tablet Take 1 tablet (40 mg total) by mouth 2 (two) times daily. 10/30/17   Regalado, Prentiss Bells, MD    Family History Family History  Problem Relation Age of Onset  . Healthy Mother   . Heart attack Father 44  . Pancreatic cancer Father   . Healthy Sister   . Healthy Brother     Social History Social History   Tobacco Use  . Smoking status: Former Smoker    Packs/day: 0.50    Years: 29.00    Pack years: 14.50    Types: Cigarettes    Last attempt to quit: 04/15/2017    Years since quitting: 0.5  . Smokeless tobacco: Never Used  Substance Use Topics  . Alcohol use: No    Comment: 10/28/2017 "nothing to drink since 2005; I'm at [facility] now"  . Drug use: Yes    Types: Cocaine, Marijuana, Methamphetamines     Allergies   Ceftin [cefuroxime axetil]; Reglan [metoclopramide]; and Penicillins   Review of Systems Review of Systems  All other systems reviewed and are negative.    Physical Exam Updated Vital Signs BP 116/72 (BP Location: Right Arm)   Pulse 75   Temp 98 F (36.7 C) (Oral)   Resp 20   Ht 5\' 9"  (1.753 m)   Wt 99.8 kg (220 lb)   SpO2 97%   BMI 32.49 kg/m   Physical Exam  Constitutional: He is oriented to person, place, and time. He appears well-developed and well-nourished.  HENT:  Head:  Normocephalic and atraumatic.  Eyes: EOM are normal.  Neck: Normal range of motion.  Cardiovascular: Normal rate, regular rhythm, normal heart sounds and intact distal pulses.  Pulmonary/Chest: Effort normal and breath sounds normal. No respiratory distress.  Abdominal: Soft. He exhibits no distension. There is no tenderness.  Musculoskeletal: Normal range of motion.  Neurological: He is alert and oriented to person, place, and time.  Skin: Skin is warm and dry.  Psychiatric: He has a normal mood and affect. Judgment normal.  Nursing note and vitals reviewed.  ED Treatments / Results  Labs (all labs ordered are listed, but only abnormal results are displayed) Labs Reviewed  BASIC METABOLIC PANEL - Abnormal; Notable for the following components:      Result Value   Glucose, Bld 123 (*)    Creatinine, Ser 1.41 (*)    GFR calc non Af Amer 59 (*)    All other components within normal limits  CBC  I-STAT TROPONIN, ED    EKG EKG Interpretation  Date/Time:  Tuesday November 11 2017 19:28:38 EDT Ventricular Rate:  75 PR Interval:    QRS Duration: 78 QT Interval:  385 QTC Calculation: 430 R Axis:   73 Text Interpretation:  Sinus rhythm similar to prior today Confirmed by Meridee Score [phone]) on 11/11/2017 7:36:03 PM Also confirmed by Azalia Bilis ([id number])  on 11/11/2017 9:36:38 PM   Radiology Dg Chest 2 View  Result Date: 11/11/2017 CLINICAL DATA:  Chest pain on left side beginning today. EXAM: CHEST - 2 VIEW COMPARISON:  11/06/2017 FINDINGS: The heart size and mediastinal contours are within normal limits. Both lungs are clear. The visualized skeletal structures are unremarkable. IMPRESSION: No active cardiopulmonary disease. Electronically Signed   By: Paulina Fusi M.D.   On: 11/11/2017 20:25    Procedures Procedures (including critical care time)  Medications Ordered in ED Medications - No data to display   Initial Impression / Assessment and Plan / ED Course  I have  reviewed the triage vital signs and the nursing notes.  Pertinent labs & imaging results that were available during my care of the patient were reviewed by me and considered in my medical decision making (see chart for details).     Significant abdominal tenderness.  Work-up in the emergency department is without significant abnormality.  CT coronary performed several weeks ago is very reassuring.  Doubt ACS.  Doubt PE.  Doubt dissection.  Primary care follow-up.  Patient understands return the emergency department for new or worsening symptoms.  Final Clinical Impressions(s) / ED Diagnoses   Final diagnoses:  Precordial chest pain    ED Discharge Orders    None       Azalia Bilis, MD 11/11/17 2144

## 2017-11-17 ENCOUNTER — Other Ambulatory Visit: Payer: Self-pay

## 2017-11-17 ENCOUNTER — Emergency Department (HOSPITAL_COMMUNITY)
Admission: EM | Admit: 2017-11-17 | Discharge: 2017-11-17 | Disposition: A | Discharge status: Home / Self Care | Payer: Self-pay | Attending: Emergency Medicine | Admitting: Emergency Medicine

## 2017-11-17 ENCOUNTER — Encounter (HOSPITAL_COMMUNITY): Payer: Self-pay

## 2017-11-17 ENCOUNTER — Emergency Department (HOSPITAL_COMMUNITY): Payer: Self-pay

## 2017-11-17 DIAGNOSIS — Z590 Homelessness: Secondary | ICD-10-CM | POA: Insufficient documentation

## 2017-11-17 DIAGNOSIS — E109 Type 1 diabetes mellitus without complications: Secondary | ICD-10-CM | POA: Insufficient documentation

## 2017-11-17 DIAGNOSIS — I252 Old myocardial infarction: Secondary | ICD-10-CM | POA: Insufficient documentation

## 2017-11-17 DIAGNOSIS — Z87891 Personal history of nicotine dependence: Secondary | ICD-10-CM | POA: Insufficient documentation

## 2017-11-17 DIAGNOSIS — Z8673 Personal history of transient ischemic attack (TIA), and cerebral infarction without residual deficits: Secondary | ICD-10-CM | POA: Insufficient documentation

## 2017-11-17 DIAGNOSIS — R0789 Other chest pain: Secondary | ICD-10-CM | POA: Insufficient documentation

## 2017-11-17 DIAGNOSIS — J45909 Unspecified asthma, uncomplicated: Secondary | ICD-10-CM | POA: Insufficient documentation

## 2017-11-17 DIAGNOSIS — I10 Essential (primary) hypertension: Secondary | ICD-10-CM | POA: Insufficient documentation

## 2017-11-17 DIAGNOSIS — R079 Chest pain, unspecified: Secondary | ICD-10-CM

## 2017-11-17 LAB — CBC
HCT: 43.5 % (ref 39.0–52.0)
Hemoglobin: 14.3 g/dL (ref 13.0–17.0)
MCH: 29.5 pg (ref 26.0–34.0)
MCHC: 32.9 g/dL (ref 30.0–36.0)
MCV: 89.9 fL (ref 78.0–100.0)
Platelets: 253 10*3/uL (ref 150–400)
RBC: 4.84 MIL/uL (ref 4.22–5.81)
RDW: 12.7 % (ref 11.5–15.5)
WBC: 6.2 10*3/uL (ref 4.0–10.5)

## 2017-11-17 LAB — BASIC METABOLIC PANEL
Anion gap: 10 (ref 5–15)
BUN: 12 mg/dL (ref 6–20)
CHLORIDE: 103 mmol/L (ref 98–111)
CO2: 26 mmol/L (ref 22–32)
Calcium: 9.3 mg/dL (ref 8.9–10.3)
Creatinine, Ser: 1.13 mg/dL (ref 0.61–1.24)
GFR calc Af Amer: >60 mL/min (ref >60)
GFR calc non Af Amer: >60 mL/min (ref >60)
GLUCOSE: 119 mg/dL — AB (ref 70–99)
Potassium: 4 mmol/L (ref 3.5–5.1)
Sodium: 139 mmol/L (ref 135–145)

## 2017-11-17 LAB — I-STAT TROPONIN, ED: Troponin i, poc: 0 ng/mL (ref 0.00–0.08)

## 2017-11-17 MED ORDER — SUCRALFATE 1 G PO TABS: 1.0000 g | ORAL_TABLET | Freq: Four times a day (QID) | ORAL | 0 refills | Status: DC

## 2017-11-17 MED ORDER — OXYCODONE HCL 5 MG PO TABS
5.0000 mg | ORAL_TABLET | Freq: Once | ORAL | Status: AC
  Administered 2017-11-17: 5 mg via ORAL
  Filled 2017-11-17: qty 1

## 2017-11-17 MED ORDER — GI COCKTAIL ~~LOC~~
30.0000 mL | Freq: Once | ORAL | Status: AC
  Administered 2017-11-17: 30 mL via ORAL
  Filled 2017-11-17: qty 30

## 2017-11-17 NOTE — ED Triage Notes (Signed)
Pt homeless[place] ems for central chest pain that started 2 days ago. The pain radiates to bilateral shoulders, denies SOB, N/V. Pt also c.o RLQ pain that is tender upon palpation. Pt took 1 Nitro priot to EMS arrival without relief of pain, EMS gave 324 ASA en route. 12 lead showed NSR, VSS, nad, pt a.o

## 2017-11-17 NOTE — Discharge Instructions (Addendum)
You were evaluated in the Emergency Department and after careful evaluation, we did not find any emergent condition requiring admission or further testing in the hospital.  Please use the medicine provided as needed for pain and follow-up with your regular doctors.  Please return to the Emergency Department if you experience any worsening of your condition.  We encourage you to follow up with a primary care provider.  Thank you for allowing [place] to be a part of your care.

## 2017-11-17 NOTE — ED Provider Notes (Signed)
[facility] Emergency Department Provider Note MRN:  [id number]  Arrival date & time: 11/17/17     Chief Complaint   Chest Pain   History of Present Illness   Barry Daugherty is a 47 y.o. year-old male with a history of hypertension, presenting to the ED with chief complaint of chest pain.  Pain is located in the left side of the chest, nonradiating, constant for the past 2 days.  Described as a dull pain, mild to moderate in severity.  Patient explains that he is homeless and has been caring all of his possessions and bags pretty frequently lately.  Denies fever cough, no dizziness or sweatiness, no shortness of breath.  Also endorsing soreness to the right side of his lateral chest.  Review of Systems  A complete 10 system review of systems was obtained and all systems are negative except as noted in the HPI and PMH.   Patient's Health History    Past Medical History:  Diagnosis Date  . Anxiety   . Asthma   . Bipolar 1 disorder (HCC)   . Brain injury (HCC)   . Chronic kidney disease    nephrectomy 1975  . Depression   . Grand mal seizure [facility])    "controlled w/RX" (10/28/2017)  . Headache    "couple/week" (10/28/2017)  . High cholesterol   . Hypertension   . Migraine    "couple/year" (10/28/2017)  . Myocardial infarction (HCC) 04/2017  . Sleep apnea    "suppose to have CPAP; I don't have one" (10/28/2017)  . Stroke [facility]) 03/2017   "left side is weaker since" (10/28/2017)  . Type I diabetes mellitus (HCC)    "dx'd 2005; can't afford insulin so I use pills" (10/28/2017)    Past Surgical History:  Procedure Laterality Date  . CHOLECYSTECTOMY OPEN    . NEPHRECTOMY Left 1975    Family History  Problem Relation Age of Onset  . Healthy Mother   . Heart attack Father 83  . Pancreatic cancer Father   . Healthy Sister   . Healthy Brother     Social History   Socioeconomic History  . Marital status: Single    Spouse name: Not on file  . Number of  children: Not on file  . Years of education: Not on file  . Highest education level: Not on file  Occupational History  . Not on file  Social Needs  . Financial resource strain: Not on file  . Food insecurity:    Worry: Not on file    Inability: Not on file  . Transportation needs:    Medical: Not on file    Non-medical: Not on file  Tobacco Use  . Smoking status: Former Smoker    Packs/day: 0.50    Years: 29.00    Pack years: 14.50    Types: Cigarettes    Last attempt to quit: 04/15/2017    Years since quitting: 0.5  . Smokeless tobacco: Never Used  Substance and Sexual Activity  . Alcohol use: No    Comment: 10/28/2017 "nothing to drink since 2005; I'm at [facility] now"  . Drug use: Yes    Types: Cocaine, Marijuana, Methamphetamines  . Sexual activity: Not Currently  Lifestyle  . Physical activity:    Days per week: Not on file    Minutes per session: Not on file  . Stress: Not on file  Relationships  . Social connections:    Talks on phone: Not on file  Gets together: Not on file    Attends religious service: Not on file    Active member of club or organization: Not on file    Attends meetings of clubs or organizations: Not on file    Relationship status: Not on file  . Intimate partner violence:    Fear of current or ex partner: Not on file    Emotionally abused: Not on file    Physically abused: Not on file    Forced sexual activity: Not on file  Other Topics Concern  . Not on file  Social History Narrative  . Not on file     Physical Exam  Vital Signs and Nursing Notes reviewed Vitals:   11/17/17 1121 11/17/17 1311  BP:  127/82  Pulse: 87 80  Resp: 14 14  Temp: 98.3 F (36.8 C)   SpO2: 95% 98%    CONSTITUTIONAL: Well-appearing, NAD NEURO:  Alert and oriented x 3, no focal deficits EYES:  eyes equal and reactive ENT/NECK:  no LAD, no JVD CARDIO: Regular rate, well-perfused, normal S1 and S2 PULM:  CTAB no wheezing or rhonchi GI/GU:  normal  bowel sounds, non-distended, non-tender MSK/SPINE:  No gross deformities, no edema SKIN:  no rash, atraumatic PSYCH:  Appropriate speech and behavior  Diagnostic and Interventional Summary    EKG Interpretation  Date/Time:    Ventricular Rate:    PR Interval:    QRS Duration:   QT Interval:    QTC Calculation:   R Axis:     Text Interpretation:        Labs Reviewed  BASIC METABOLIC PANEL - Abnormal; Notable for the following components:      Result Value   Glucose, Bld 119 (*)    All other components within normal limits  CBC  I-STAT TROPONIN, ED    DG Chest 2 View  Final Result      Medications  gi cocktail (Maalox,Lidocaine,Donnatal) (30 mLs Oral Given 11/17/17 1241)  oxyCODONE (Oxy IR/ROXICODONE) immediate release tablet 5 mg (5 mg Oral Given 11/17/17 1241)     Procedures Critical Care  ED Course and Medical Decision Making  I have reviewed the triage vital signs and the nursing notes.  Pertinent labs & imaging results that were available during my care of the patient were reviewed by me and considered in my medical decision making (see below for details). Clinical Course as of Nov 18 2255  Mon Nov 17, 2017  3231 47 year old male here with atypical chest pain, does have a few risk factors for cardiovascular disease, but favoring MSK today given his recent increased activity moving all of his possessions as he is homeless.  EKG with nonspecific findings.  Troponin negative.  Heart score of 3, low risk and will recommend cardiology follow-up for stress testing.   [MB]  1311 Per chart review, recent admission for chest pain rule out.  Had multiple negative troponins, as well as a CT coronary that showed no disease.  Favoring GI etiology, prescription for Carafate.After the discussed management above, the patient was determined to be safe for discharge.  The patient was in agreement with this plan and all questions regarding their care were answered.  ED return precautions  were discussed and the patient will return to the ED with any significant worsening of condition.   [MB]    Clinical Course User Index [MB] Pilar Plate Elmer Sow, MD     Elmer Sow. Pilar Plate, MD [facility] Emergency Medicine [facility] [email or web address]  Final Clinical Impressions(s) / ED Diagnoses     ICD-10-CM   1. Chest pain, unspecified type R07.9     ED Discharge Orders        Ordered    sucralfate (CARAFATE) 1 g tablet  4 times daily     11/17/17 1310         Sabas Sous, MD 11/17/17 2257

## 2018-07-29 IMAGING — DX DG CHEST 2V
2 series · 2 of 2 positions shown · non-contrast
Comparison: None.

CLINICAL DATA: Chest pain, shortness of breath.

EXAM:
CHEST - 2 VIEW

[x chest ap]
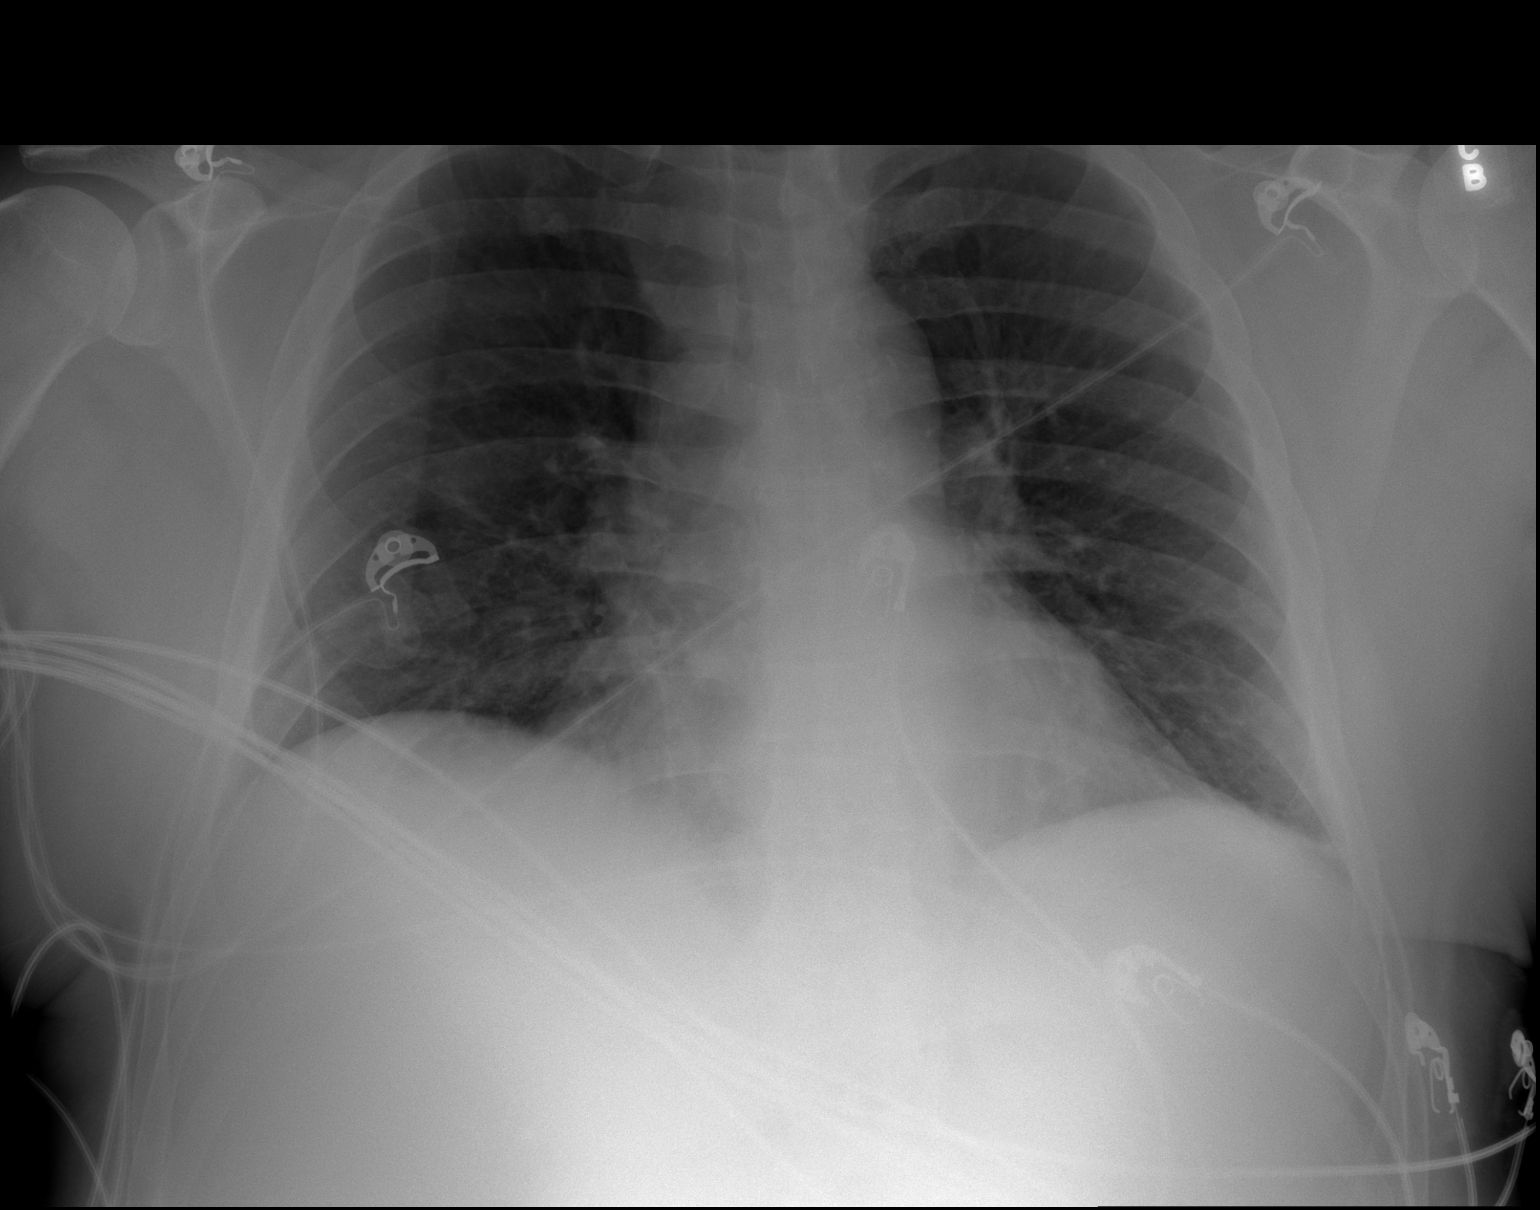

[w chest lat]
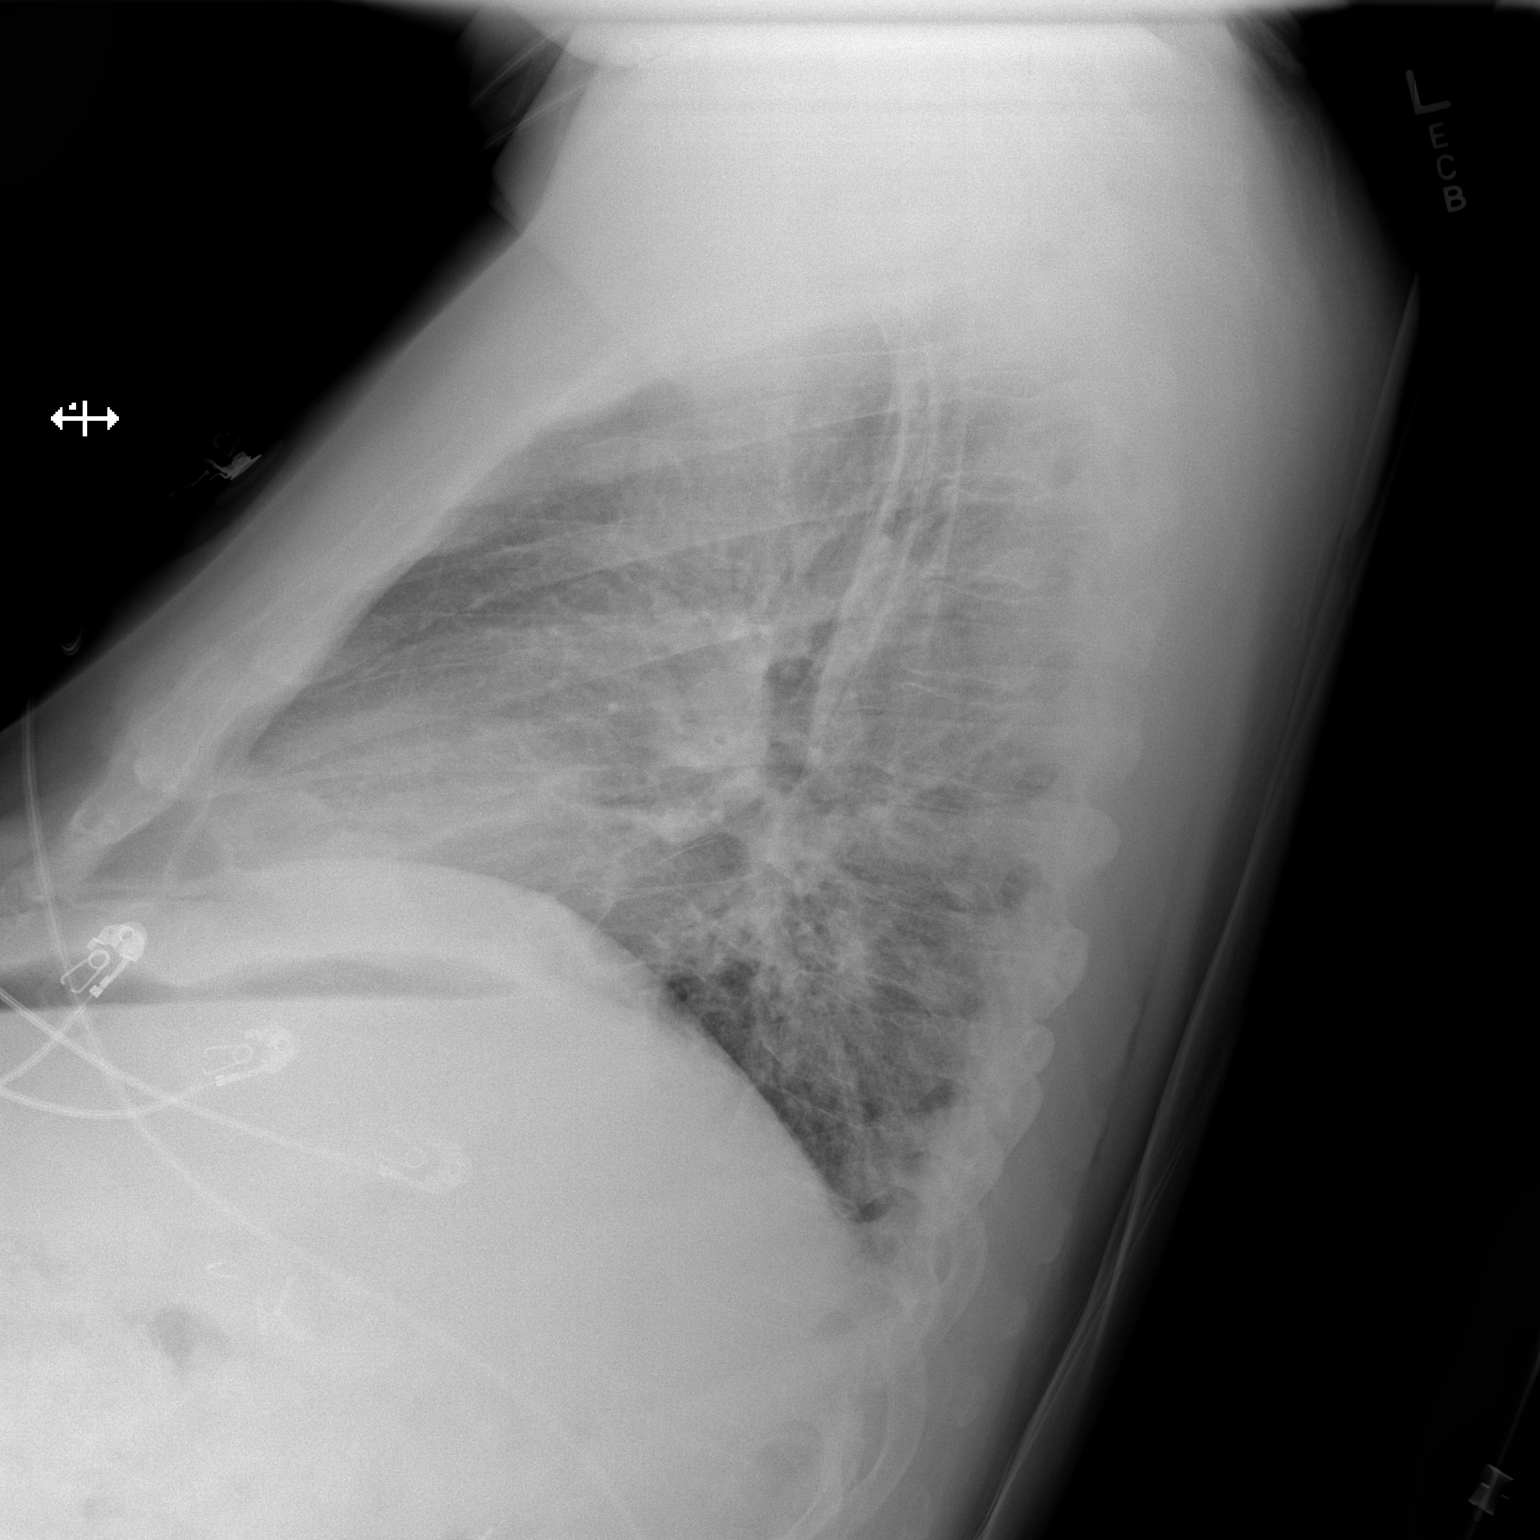

[2 of 2 positions shown; findings below may reference images not displayed]

FINDINGS: The heart size and mediastinal contours are within normal limits.
Both lungs are clear. No pneumothorax or pleural effusion is noted.
The visualized skeletal structures are unremarkable.
IMPRESSION: No active cardiopulmonary disease.

## 2018-08-24 IMAGING — DX DG CHEST 2V
2 series · 2 of 2 positions shown · non-contrast
Comparison: 11/11/2017

CLINICAL DATA: 46-year-old male with a history of chest pain

EXAM:
CHEST - 2 VIEW

[chest pa]
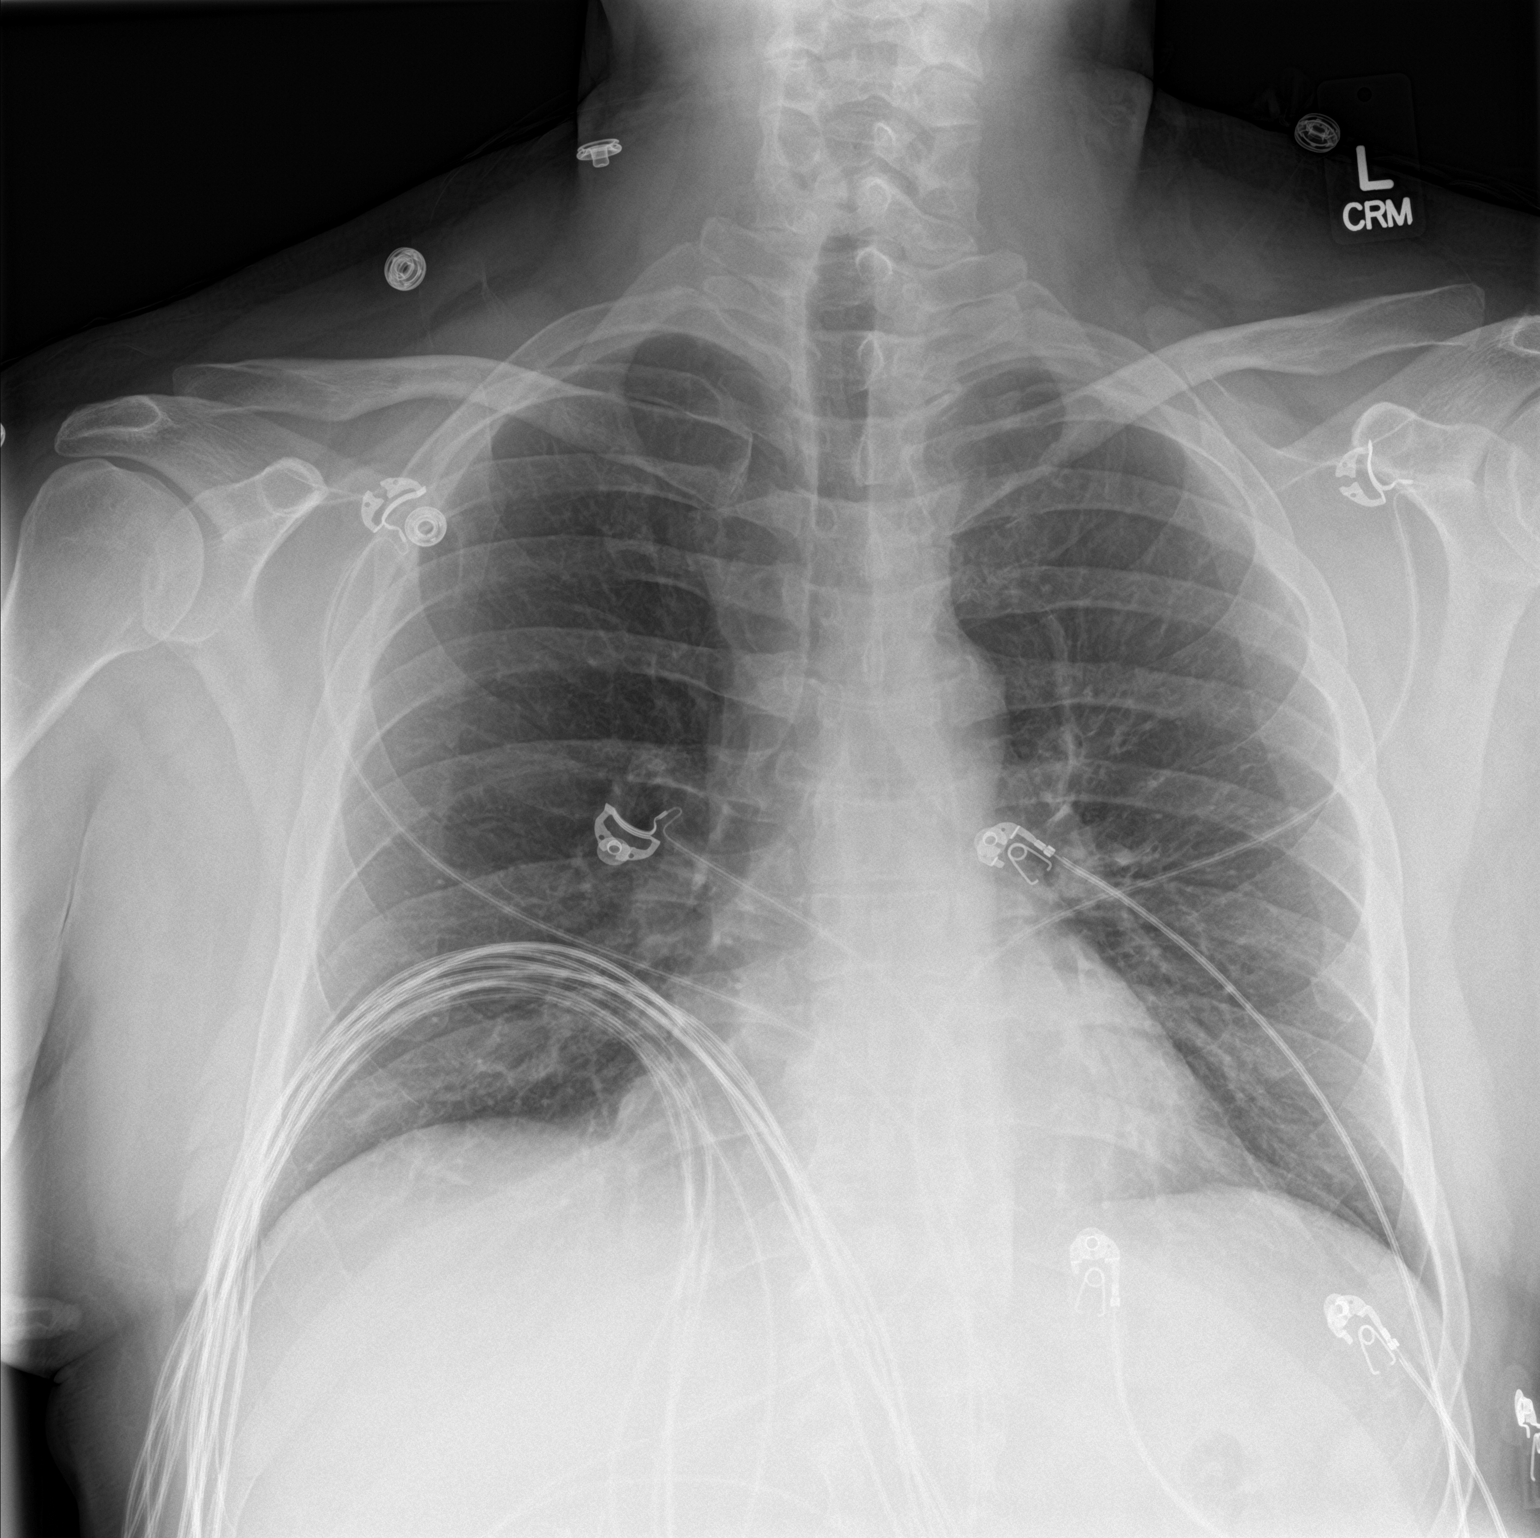

[chest lat]
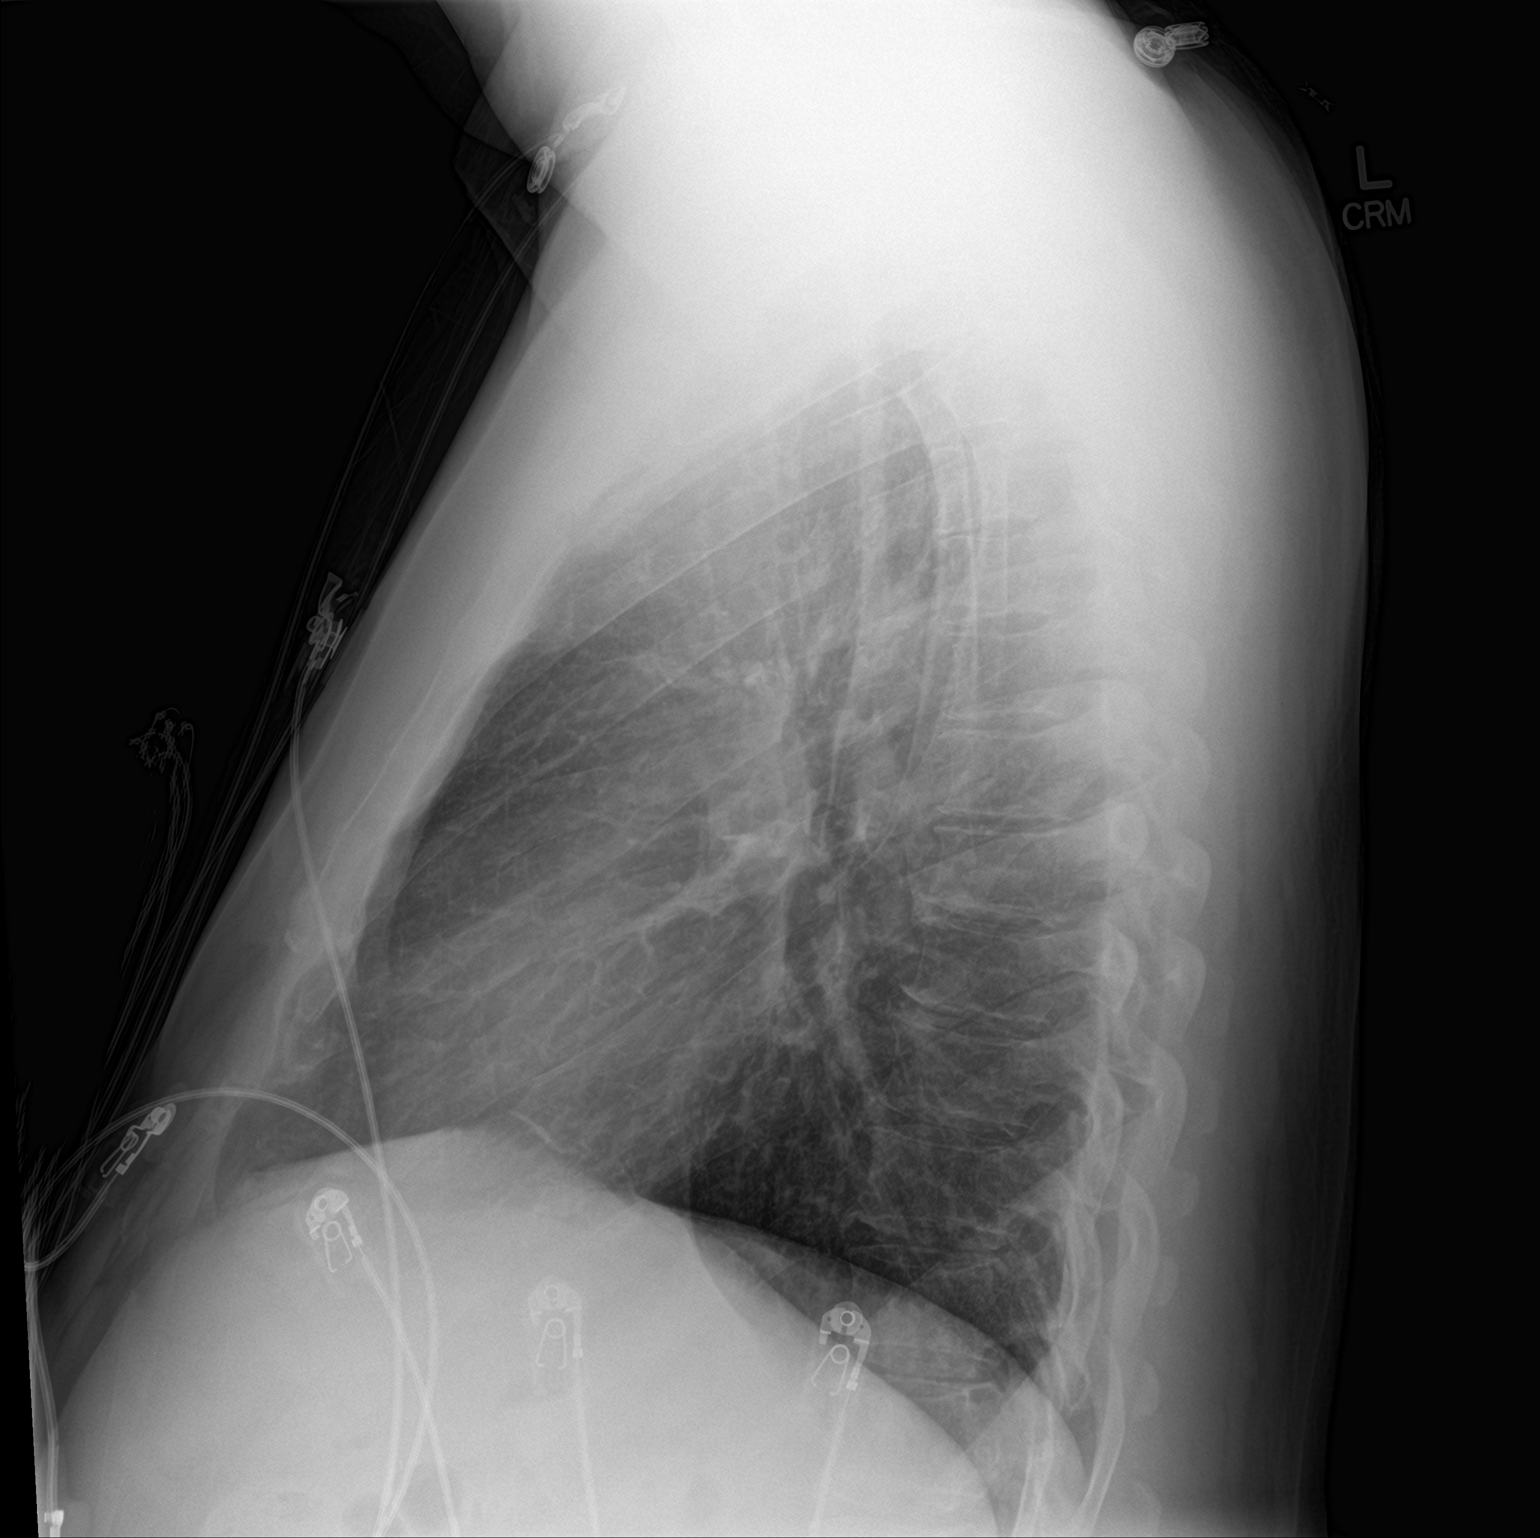

[2 of 2 positions shown; findings below may reference images not displayed]

FINDINGS: The heart size and mediastinal contours are within normal limits.
Both lungs are clear. The visualized skeletal structures are
unremarkable.
IMPRESSION: No radiographic evidence of acute cardiopulmonary disease

## 2019-01-30 LAB — COMPREHENSIVE METABOLIC PANEL
ALT: 48 U/L — ABNORMAL HIGH (ref 0–41)
AST: 50 U/L — ABNORMAL HIGH (ref 0–40)
Albumin/Globulin Ratio: 1.64 mmol/L (ref 1.00–2.00)
Albumin: 4.1 g/dL (ref 3.5–5.2)
Alk Phosphatase: 70 U/L (ref 40–130)
Anion Gap: 13 mmol/L (ref 2–17)
BUN: 12 mg/dL (ref 6–20)
CO2: 23 mmol/L (ref 22–29)
Calcium: 8.8 mg/dL (ref 8.6–10.0)
Chloride: 101 mmol/L (ref 98–107)
Creatinine: 1 mg/dL (ref 0.7–1.3)
GFR African American: 104 mL/min/{1.73_m2} (ref >=90)
GFR Non-African American: 90 mL/min/{1.73_m2} (ref >=90)
Globulin: 2.5 g/dL (ref 1.9–4.4)
Glucose: 118 mg/dL — ABNORMAL HIGH (ref 70–99)
OSMOLALITY CALCULATED: 274 mOsm/kg (ref 270–287)
Potassium: 4.1 mmol/L (ref 3.5–5.3)
Sodium: 137 mmol/L (ref 135–145)
Total Bilirubin: 0.3 mg/dL (ref 0.00–1.20)
Total Protein: 6.6 g/dL (ref 6.4–8.3)

## 2019-01-30 LAB — CBC WITH AUTO DIFFERENTIAL
Absolute Baso #: 0 10*3/uL (ref 0.0–0.2)
Absolute Eos #: 0.1 10*3/uL (ref 0.0–0.5)
Absolute Lymph #: 1.5 10*3/uL (ref 1.0–3.2)
Absolute Mono #: 0.6 10*3/uL (ref 0.3–1.0)
Basophils %: 0.4 % (ref 0.0–2.0)
Eosinophils %: 1.2 % (ref 0.0–7.0)
Hematocrit: 42 % (ref 38.0–52.0)
Hemoglobin: 13.3 g/dL (ref 13.0–17.3)
Immature Grans (Abs): 0.02 10*3/uL (ref 0.00–0.06)
Immature Granulocytes: 0.4 % (ref 0.1–0.6)
Lymphocytes: 29.7 % (ref 15.0–45.0)
MCH: 27.9 pg (ref 27.0–34.5)
MCHC: 31.7 g/dL — ABNORMAL LOW (ref 32.0–36.0)
MCV: 88.2 fL (ref 84.0–100.0)
MPV: 8.9 fL (ref 7.2–13.2)
Monocytes: 11.8 % (ref 4.0–12.0)
NRBC Absolute: 0 10*3/uL (ref 0.000–0.012)
NRBC Automated: 0 % (ref 0.0–0.2)
Neutrophils %: 56.5 % (ref 42.0–74.0)
Neutrophils Absolute: 2.8 10*3/uL (ref 1.6–7.3)
Platelets: 245 10*3/uL (ref 140–440)
RBC: 4.76 x10e6/mcL (ref 4.00–5.60)
RDW: 14.6 % (ref 11.0–16.0)
WBC: 5 10*3/uL (ref 3.8–10.6)

## 2019-01-30 LAB — TROPONIN T: Troponin T: <0.01 ng/mL (ref 0.000–0.010)

## 2019-01-30 NOTE — ED Notes (Signed)
ED Triage Note       ED Triage Adult Entered On:  01/30/2019 18:20 EDT    Performed On:  25/95/6387 56:43 EDT by Rolland Porter, RN, LAURA L               Triage   Chief Complaint :   Generalized mallaise after walking all day.  Reports chest pain upon arrival to ED.  Treated at [facility] last night for same.   Numeric Rating Pain Scale :   7   [place] Mode of Arrival :   Ambulance   Infectious Disease Documentation :   Document assessment   Patient received chemo or biotherapy last 48 hrs? :   No   Temperature Oral :   36.3 degC(Converted to: 97.3 degF)    Heart Rate Monitored :   95 bpm   Respiratory Rate :   19 br/min   Systolic Blood Pressure :   329 mmHg   Diastolic Blood Pressure :   88 mmHg   SpO2 :   95 %   Oxygen Therapy :   Room air   Patient presentation :   None of the above   Chief Complaint or Presentation suggest infection :   No   Dosing Weight Obtained By :   Patient stated   Weight Dosing :   110 kg(Converted to: 242 lb 8 oz)    Height :   175.3 cm(Converted to: 5 ft 9 in)    Body Mass Index Dosing :   36 kg/m2   CERTEZA, RN, LAURA L - 01/30/2019 18:15 EDT   DCP GENERIC CODE   Tracking Acuity :   3   Tracking Group :   ED [place], RN, Barnabas Lister - 01/30/2019 18:15 EDT   ED General Section :   Document assessment   Pregnancy Status :   N/A   ED Allergies Section :   Document assessment   ED Reason for Visit Section :   Document assessment   ED Quick Assessment :   Patient appears awake, alert, oriented to baseline. Skin warm and dry. Moves all extremities. Respiration even and unlabored. Appears in no apparent distress.   Rolland Porter RN, Barnabas Lister - 01/30/2019 18:15 EDT   PTA/Triage Treatments   ED PTA Pre-Arrival Service :   [place], [place], Barnabas Lister - 01/30/2019 18:15 EDT   ID Risk Screen Symptoms   Recent Travel History :   No recent travel   Close Contact with COVID-19 ID :   No   Last 14 days COVID-19 ID :   Yes - Not Detected (negative)   TB Symptom Screen :   No  symptoms   C. diff Symptom/History ID :   Neither of the above   CERTEZA, RN, LAURA L - 01/30/2019 18:15 EDT   Allergies   (As Of: 01/30/2019 18:20:08 EDT)   Allergies (Active)   Ceftin  Estimated Onset Date:   Unspecified ; Reactions:   Hives ; Created By:   Rolland Porter, RN, LAURA L; Reaction Status:   Active ; Category:   Drug ; Substance:   Ceftin ; Type:   Allergy ; Severity:   Moderate ; Updated By:   Rolland Porter RN, Barnabas Lister; Reviewed Date:   01/30/2019 18:18 EDT      penicillin  Estimated Onset Date:   Unspecified ; Reactions:   Hives ; Created By:   Rolland Porter, RN, LAURA L; Reaction Status:  Active ; Category:   Drug ; Substance:   penicillin ; Type:   Allergy ; Severity:   Moderate ; Updated By:   Lyndon Code, RN, Rolin Barry; Reviewed Date:   01/30/2019 18:18 EDT      Reglan  Estimated Onset Date:   Unspecified ; Reactions:   Hives ; Created By:   Lyndon Code, RN, LAURA L; Reaction Status:   Active ; Category:   Drug ; Substance:   Reglan ; Type:   Allergy ; Severity:   Moderate ; Updated By:   Lyndon Code, RN, Rolin Barry; Reviewed Date:   01/30/2019 18:18 EDT      Toradol  Estimated Onset Date:   Unspecified ; Reactions:   Hives ; Created By:   Lyndon Code, RN, LAURA L; Reaction Status:   Active ; Category:   Drug ; Substance:   Toradol ; Type:   Allergy ; Severity:   Moderate ; Updated By:   Faythe Casa; Reviewed Date:   01/30/2019 18:18 EDT        Psycho-Social   Last 3 mo, thoughts killing self/others :   Patient denies   Lyndon Code RNRolin Barry - 01/30/2019 18:15 EDT   ED Reason for Visit   (As Of: 01/30/2019 18:20:08 EDT)   Problems(Active)    CAD (coronary artery disease) (SNOMED CT  :76160737 )  Name of Problem:   CAD (coronary artery disease) ; Recorder:   CERTEZA, RN, LAURA L; Confirmation:   Confirmed ; Classification:   Patient Stated ; Code:   [id number] ; Contributor System:   Dietitian ; Last Updated:   01/30/2019 18:19 EDT ; Life Cycle Date:   01/30/2019 ; Life Cycle Status:   Active ; Vocabulary:   SNOMED CT         Cerebrovascular accident (CVA) (SNOMED CT  :546270350 )  Name of Problem:   Cerebrovascular accident (CVA) ; Recorder:   CERTEZA, RN, LAURA L; Confirmation:   Confirmed ; Classification:   Patient Stated ; Code:   [id number] ; Contributor System:   Dietitian ; Last Updated:   01/30/2019 18:19 EDT ; Life Cycle Date:   01/30/2019 ; Life Cycle Status:   Active ; Vocabulary:   SNOMED CT        Diabetes (SNOMED CT  :371696789 )  Name of Problem:   Diabetes ; Recorder:   CERTEZA, RN, LAURA L; Confirmation:   Confirmed ; Classification:   Patient Stated ; Code:   [id number] ; Contributor System:   PowerChart ; Last Updated:   01/30/2019 18:19 EDT ; Life Cycle Date:   01/30/2019 ; Life Cycle Status:   Active ; Vocabulary:   SNOMED CT        HTN (hypertension) (SNOMED CT  :2585277824 )  Name of Problem:   HTN (hypertension) ; Recorder:   CERTEZA, RN, LAURA L; Confirmation:   Confirmed ; Classification:   Patient Stated ; Code:   [id number] ; Contributor System:   Dietitian ; Last Updated:   01/30/2019 18:18 EDT ; Life Cycle Date:   01/30/2019 ; Life Cycle Status:   Active ; Vocabulary:   SNOMED CT        Seizures (SNOMED CT  :540086761 )  Name of Problem:   Seizures ; Recorder:   CERTEZA, RN, LAURA L; Confirmation:   Confirmed ; Classification:   Patient Stated ; Code:   [id number] ; Contributor System:   Dietitian ; Last Updated:   01/30/2019 18:19 EDT ; Life Cycle Date:  01/30/2019 ; Life Cycle Status:   Active ; Vocabulary:   SNOMED CT          Diagnoses(Active)    Malaise  Date:   01/30/2019 ; Diagnosis Type:   Reason For Visit ; Confirmation:   Complaint of ; Clinical Dx:   Malaise ; Classification:   Medical ; Clinical Service:   Emergency medicine ; Code:   PNED ; Probability:   0 ; Diagnosis Code:   [id number]

## 2019-01-30 NOTE — ED Provider Notes (Signed)
General Medical Problem *ED        Patient:   Dean Rogers, Dean Rogers            MRN: [id number]            FIN: [id number]               Age:   48 years     Sex:  Male     DOB:  1971/01/08   Associated Diagnoses:   Nonspecific chest pain; Suicidal ideation; Homelessness; Diabetes   Author:   Crecencio Mc      Basic Information   Time seen: Provider Seen (ST)   ED Provider/Time:    Crecencio Mc / 01/30/2019 18:32  .   History source: Patient.   Arrival mode: Ambulance.   History limitation: None.   Additional information: Chief Complaint from Nursing Triage Note   Chief Complaint  Chief Complaint: Generalized mallaise after walking all day.  Reports chest pain upon arrival to ED.  Treated at [place] last night for same. (01/30/19 18:15:00).      History of Present Illness   48 year old male presents emergency department complaining of generalized malaise.  He states he has been walking all day and has not had anything to eat or drink.  He also mentions that he has diabetes.  On arrival to the ED he started complaining of chest pain.  Patient states he was treated at [facility] for the same last night.  He has prescriptions for aspirin, Plavix, and Lipitor from his ED visit last night.  He states he was there for chest pain but they discharged him.  Patient is new to [place] and does not have any place to go.  He states he came here to "better his life".      Review of Systems   Constitutional symptoms:  Negative except as documented in HPI.   Skin symptoms:  No rash,    Eye symptoms:  Vision unchanged.   ENMT symptoms:  No sore throat, no nasal congestion.    Respiratory symptoms:  No shortness of breath, no cough.    Cardiovascular symptoms:  Chest pain, No palpitations,    Gastrointestinal symptoms:  Nausea, no abdominal pain, no vomiting, no diarrhea, no rectal bleeding.    Genitourinary symptoms:  No dysuria, no hematuria.    Musculoskeletal symptoms:  No back pain,    Neurologic symptoms:  No headache,        Health Status   Allergies:    Allergic Reactions (Selected)  Moderate  Ceftin- Hives.  Penicillin- Hives.  Reglan- Hives.  Toradol- Hives..      Past Medical/ Family/ Social History   Medical history: Reviewed as documented in chart.   Surgical history: Reviewed as documented in chart.   Family history: Not significant.   Social history: Reviewed as documented in chart.   Problem list:    Active Problems (5)  CAD (coronary artery disease)   Cerebrovascular accident (CVA)   Diabetes   HTN (hypertension)   Seizures   , per nurse's notes.      Physical Examination               Vital Signs   Vital Signs   24/40/1027 25:36 EDT Systolic Blood Pressure 644 mmHg    Diastolic Blood Pressure 64 mmHg    Peripheral Pulse Rate 86 bpm    Heart Rate Monitored 90 bpm    Respiratory Rate 17 br/min  Mean Arterial Pressure, Cuff 83 mmHg    SpO2 92 %   84/13/2440 10:27 EDT Systolic Blood Pressure 253 mmHg    Diastolic Blood Pressure 82 mmHg    Peripheral Pulse Rate 85 bpm    Heart Rate Monitored 85 bpm    Respiratory Rate 19 br/min    Mean Arterial Pressure, Cuff 98 mmHg    SpO2 97 %   66/44/0347 42:59 EDT Systolic Blood Pressure 563 mmHg    Diastolic Blood Pressure 78 mmHg    Peripheral Pulse Rate 83 bpm    Heart Rate Monitored 83 bpm    Respiratory Rate 16 br/min    Mean Arterial Pressure, Cuff 95 mmHg    SpO2 96 %   87/56/4332 95:18 EDT Systolic Blood Pressure 841 mmHg    Diastolic Blood Pressure 82 mmHg    Peripheral Pulse Rate 93 bpm    Heart Rate Monitored 92 bpm    Respiratory Rate 15 br/min    Mean Arterial Pressure, Cuff 95 mmHg    SpO2 97 %   66/09/3014 01:09 EDT Systolic Blood Pressure 323 mmHg  HI    Diastolic Blood Pressure 87 mmHg    Peripheral Pulse Rate 90 bpm    Heart Rate Monitored 97 bpm    Respiratory Rate 17 br/min    Mean Arterial Pressure, Cuff 102 mmHg  HI    SpO2 98 %   55/73/2202 54:27 EDT Systolic Blood Pressure 062 mmHg    Diastolic Blood Pressure 89 mmHg    Peripheral Pulse Rate 100 bpm    Heart Rate  Monitored 97 bpm    Respiratory Rate 13 br/min    Mean Arterial Pressure, Cuff 106 mmHg  HI    SpO2 98 %   37/62/8315 17:61 EDT Systolic Blood Pressure 607 mmHg    Diastolic Blood Pressure 88 mmHg    Temperature Oral 36.3 degC    Heart Rate Monitored 95 bpm    Respiratory Rate 19 br/min    SpO2 95 %   .   Measurements   01/30/2019 18:20 EDT Body Mass Index est meas 35.80 kg/m2   01/30/2019 18:20 EDT Body Mass Index Measured 35.80 kg/m2   01/30/2019 18:15 EDT Height/Length Measured 175.3 cm    Weight Dosing 110 kg   .   Basic Oxygen Information   01/30/2019 22:00 EDT SpO2 92 %    Oxygen Therapy Room air   01/30/2019 21:30 EDT SpO2 97 %    Oxygen Therapy Room air   01/30/2019 21:00 EDT SpO2 96 %    Oxygen Therapy Room air   01/30/2019 20:30 EDT SpO2 97 %    Oxygen Therapy Room air   01/30/2019 20:16 EDT SpO2 98 %    Oxygen Therapy Room air   01/30/2019 19:47 EDT SpO2 98 %    Oxygen Therapy Room air   01/30/2019 18:15 EDT SpO2 95 %    Oxygen Therapy Room air   .   General:  Alert, no acute distress, unkempt.    Skin:  Warm, dry, intact.    Head:  Normocephalic, atraumatic.    Neck:  Supple, trachea midline.    Eye:  Extraocular movements are intact, normal conjunctiva.    Cardiovascular:  Regular rate and rhythm, Normal peripheral perfusion.    Respiratory:  Lungs are clear to auscultation, respirations are non-labored.    Chest wall:  No tenderness.   Back:  Normal range of motion.   Musculoskeletal:  Normal ROM, no deformity.  Gastrointestinal:  Soft, Nontender, Non distended.    Neurological:  Alert and oriented to person, place, time, and situation, No focal neurological deficit observed.       Medical Decision Making   Rationale:  Patient was found to have no significant abnormalities on his work-up in the ED.  EKG is nonischemic.  Negative troponin.  The patient was given this information and said he was still not feeling well.  I offered the patient a COVID test prior to discharge and he accepted.    When the  nurse went to try to discharge the patient he began telling her he was having suicidal thoughts.  I was able to go back and talk to the patient and he states that he has been having suicidal thoughts for the past few days but they worsened after he went to the bathroom here in the ED.  His plan would be to overdose or walk into traffic.  He admits to prior suicide attempts with both methods.    The patient is homeless and the timing of his suicidal thoughts just prior to discharge seems to be more consistent with malingering.  The patient will be held overnight in the ED, but if at any point he states that he is not suicidal and would like to leave I feel he is safe to do so.  The plan will be for the patient to be evaluated by case management and psychiatry in the morning..   Documents reviewed:  Emergency department nurses' notes.   Electrocardiogram:  Emergency Provider interpretation performed by me, See ECG ED Review.    Results review:  Lab results : Lab View   01/30/2019 21:11 EDT SARS Cov2 Ag FIA Negative   01/30/2019 20:25 EDT Appear U POC Clear    Color U POC Yellow    Bili U POC Negative    Blood U POC Negative    Glucose U POC Negative mg/dL    Ketones U POC Negative mg/dL    Leuk Est U POC Negative    Nitrite U POC Negative    pH U POC 6.0    Protein U POC Negative    Spec Grav U POC >=1.030    Urobilin U POC 0.2 EU/dL   01/30/2019 19:40 EDT Estimated Creatinine Clearance 112.85 mL/min   01/30/2019 19:02 EDT WBC 5.0 x10e3/mcL    RBC 4.76 x10e6/mcL    Hgb 13.3 g/dL    HCT 42.0 %    MCV 88.2 fL    MCH 27.9 pg    MCHC 31.7 g/dL  LOW    RDW 14.6 %    Platelet 245 x10e3/mcL    MPV 8.9 fL    Neutro Auto 56.5 %    Neutro Absolute 2.8 x10e3/mcL    Immature Grans Percent 0.4 %    Immature Grans Absolute 0.02 x10e3/mcL    Lymph Auto 29.7 %    Lymph Absolute 1.5 x10e3/mcL    Mono Auto 11.8 %    Mono Absolute 0.6 x10e3/mcL    Eosinophil Percent 1.2 %    Eos Absolute 0.1 x10e3/mcL    Basophil Auto 0.4 %    Baso  Absolute 0.0 x10e3/mcL    NRBC Absolute Auto 0.000 x10e3/mcL    NRBC Percent Auto 0.0 %    Sodium Lvl 137 mmol/L    Potassium Lvl 4.1 mmol/L    Chloride 101 mmol/L    CO2 23 mmol/L    Glucose Random 118 mg/dL  HI  BUN 12 mg/dL    Creatinine Lvl 1.0 mg/dL    AGAP 13 mmol/L    Osmolality Calc 274 mOsm/kg    Calcium Lvl 8.8 mg/dL    Protein Total 6.6 g/dL    Albumin Lvl 4.1 g/dL    Globulin Calc 2.5 g/dL    AG Ratio Calc 1.64 mmol/L    Alk Phos 70 unit/L    AST 50 unit/L  HI    ALT 48 unit/L  HI    eGFR AA 104 mL/min/1.77m   eGFR Non-AA 90 mL/min/1.723m  Bili Total 0.30 mg/dL    Trop T Quant <0.010 ng/mL   .   Chest X-Ray:  FINAL REPORT  CHEST:   AP (or PA),  01/30/2019 7:35 PM    INDICATION: Chest pain      COMPARISON: None    FINDINGS: Cardiac silhouette is not enlarged. Pulmonary vasculature is not  increased. No pulmonary edema or pneumonia is seen. No pleural fluid is  identified.      IMPRESSION:    No acute process identified.    Signature Line  ***** Final *****      Releasing Radiologist:   MEBrandon MelnickReleased Date and Time:  01/30/19 19:41.      Reexamination/ Reevaluation   Time: 01/30/2019 23:00:00 .   Notes: Patient voicing suicidal thoughts.  We will plan to move rooms and placed on suicide precautions..   Time: 01/30/2019 23:45:00 .   Notes: Patient will be placed on a security watch and signed out to the overnight attending..      Impression and Plan   Diagnosis   Nonspecific chest pain (ICD10-CM R07.9, Discharge, Medical)   Suicidal ideation (ICD10-CM R45.851, Discharge, Medical)   Homelessness (ICD10-CM Z59.0, Discharge, Medical)   Diabetes (ICD10-CM E11.9, Discharge, Medical)   Plan   Condition: Stable.    Disposition: Patient care transitioned to: Time: 01/30/2019 23:47:00, MANDEL-DO,  ADAM HOWARD.    Counseled: Patient, Regarding diagnosis, Regarding diagnostic results, Regarding treatment plan, Patient indicated understanding of instructions.    Signature Line     Electronically  Signed on 01/30/2019 11:47 PM EDT   ________________________________________________   YOCrecencio Mc

## 2019-01-30 NOTE — ED Notes (Signed)
ED Pre-Arrival Note        Pre-Arrival Summary    Name:  ccems 48,    Current Date:  01/30/2019 18:12:44 EDT  Gender:  Male  Date of Birth:    Age:  48  Pre-Arrival Type:  EMS  ETA:  01/30/2019 18:29:00 EDT  Primary Care Physician:    Presenting Problem:  gen malaise  Pre-Arrival User:  Jillyn Hidden, RN, Marita Kansas  Referring Source:    Location:  [facility]            PreArrival Communication Form  Emergency Department        Additional Patient Information:        Orders:  [    ] CBC                                            [     ] CT Head no contrast  [    ] BMP                                           [     ] CT Abdomen/Pelvis no contrast  [    ] PT/INR                                       [     ] CT Abdomen/Pelvis IV contrast, w/ oral contrast  [    ] Troponin                                   [     ] CT Abdomen/Pelvis IV contrast, no oral contrast  [    ] BNP                                            [     ] See ordersheet  [    ] CXR                                             [     ] Other:__________________________  [    ] EKG

## 2019-01-30 NOTE — ED Notes (Signed)
ED Triage Note       ED Secondary Triage Entered On:  01/30/2019 19:23 EDT    Performed On:  01/30/2019 19:21 EDT by Katheren ShamsFelske, RN, Dawson BillsJaclynne A               General Information   Barriers to Learning :   None evident   Languages :   English   ED Home Meds Section :   Document assessment   [facility] ED Fall Risk Section :   Document assessment   ED Advance Directives Section :   Document assessment   ED Palliative Screen :   N/A (prefilled for <65yo)   Katheren ShamsFelske, RN, Jaclynne A - 01/30/2019 19:21 EDT   (As Of: 01/30/2019 19:23:08 EDT)   Problems(Active)    CAD (coronary artery disease) (SNOMED CT  :[id number] )  Name of Problem:   CAD (coronary artery disease) ; Recorder:   CERTEZA, RN, LAURA L; Confirmation:   Confirmed ; Classification:   Patient Stated ; Code:   [id number] ; Contributor System:   DietitianowerChart ; Last Updated:   01/30/2019 18:19 EDT ; Life Cycle Date:   01/30/2019 ; Life Cycle Status:   Active ; Vocabulary:   SNOMED CT        Cerebrovascular accident (CVA) (SNOMED CT  :[id number] )  Name of Problem:   Cerebrovascular accident (CVA) ; Recorder:   CERTEZA, RN, LAURA L; Confirmation:   Confirmed ; Classification:   Patient Stated ; Code:   [id number] ; Contributor System:   DietitianowerChart ; Last Updated:   01/30/2019 18:19 EDT ; Life Cycle Date:   01/30/2019 ; Life Cycle Status:   Active ; Vocabulary:   SNOMED CT        Diabetes (SNOMED CT  :696295284121589010 )  Name of Problem:   Diabetes ; Recorder:   CERTEZA, RN, LAURA L; Confirmation:   Confirmed ; Classification:   Patient Stated ; Code:   132440102121589010 ; Contributor System:   PowerChart ; Last Updated:   01/30/2019 18:19 EDT ; Life Cycle Date:   01/30/2019 ; Life Cycle Status:   Active ; Vocabulary:   SNOMED CT        HTN (hypertension) (SNOMED CT  :[phone] )  Name of Problem:   HTN (hypertension) ; Recorder:   CERTEZA, RN, LAURA L; Confirmation:   Confirmed ; Classification:   Patient Stated ; Code:   [phone] ; Contributor System:   DietitianowerChart ; Last Updated:    01/30/2019 18:18 EDT ; Life Cycle Date:   01/30/2019 ; Life Cycle Status:   Active ; Vocabulary:   SNOMED CT        Seizures (SNOMED CT  :756433295151074011 )  Name of Problem:   Seizures ; Recorder:   CERTEZA, RN, LAURA L; Confirmation:   Confirmed ; Classification:   Patient Stated ; Code:   [id number] ; Contributor System:   DietitianowerChart ; Last Updated:   01/30/2019 18:19 EDT ; Life Cycle Date:   01/30/2019 ; Life Cycle Status:   Active ; Vocabulary:   SNOMED CT          Diagnoses(Active)    Malaise  Date:   01/30/2019 ; Diagnosis Type:   Reason For Visit ; Confirmation:   Complaint of ; Clinical Dx:   Malaise ; Classification:   Medical ; Clinical Service:   Emergency medicine ; Code:   PNED ; Probability:   0 ; Diagnosis Code:   [id number]             -  Procedure History   (As Of: 01/30/2019 19:23:08 EDT)     [facility]ch Fall Risk Assessment Tool   Hx of falling last 3 months ED Fall :   No   Patient confused or disoriented ED Fall :   No   Patient intoxicated or sedated ED Fall :   No   Patient impaired gait ED Fall :   No   Use a mobility assistance device ED Fall :   No   Patient altered elimination ED Fall :   No   [facility] ED Fall Score :   0    Katheren Shams, RN, Dawson Bills - 01/30/2019 19:21 EDT   ED Advance Directive   Advance Directive :   No   Katheren Shams, RN, Rockne Coons A - 01/30/2019 19:21 EDT   Med Hx   Medication List   (As Of: 01/30/2019 19:23:08 EDT)   Normal Order    acetaminophen 325 mg Tab  :   acetaminophen 325 mg Tab ; Status:   Completed ; Ordered As Mnemonic:   acetaminophen ; Simple Display Line:   650 mg, 2 tabs, Oral, Once ; Ordering Provider:   YOUNG, DO, KELLI M; Catalog Code:   acetaminophen ; Order Dt/Tm:   01/30/2019 18:52:10 EDT          aspirin 81 mg Chew Tab  :   aspirin 81 mg Chew Tab ; Status:   Completed ; Ordered As Mnemonic:   aspirin ; Simple Display Line:   324 mg, 4 tabs, Chewed, Once ; Ordering Provider:   YOUNG, DO, KELLI M; Catalog Code:   aspirin ; Order Dt/Tm:    01/30/2019 18:52:30 EDT          ondansetron 2 mg/mL Inj Soln 2 mL  :   ondansetron 2 mg/mL Inj Soln 2 mL ; Status:   Completed ; Ordered As Mnemonic:   Zofran ; Simple Display Line:   4 mg, 2 mL, IV Push, Once ; Ordering Provider:   YOUNG, DO, KELLI M; Catalog Code:   ondansetron ; Order Dt/Tm:   01/30/2019 18:52:00 EDT          Sodium Chloride 0.9% intravenous solution Bolus  :   Sodium Chloride 0.9% intravenous solution Bolus ; Status:   Completed ; Ordered As Mnemonic:   Sodium Chloride 0.9% bolus ; Simple Display Line:   1,000 mL, 1000 mL/hr, IV Piggyback, Once ; Ordering Provider:   YOUNG, DO, KELLI M; Catalog Code:   Sodium Chloride 0.9% ; Order Dt/Tm:   01/30/2019 18:52:00 EDT            Home Meds    busPIRone  :   busPIRone ; Status:   Documented ; Ordered As Mnemonic:   busPIRone ; Simple Display Line:   Oral, BID, 0 Refill(s) ; Catalog Code:   busPIRone ; Order Dt/Tm:   01/30/2019 19:22:50 EDT          hydrOXYzine  :   hydrOXYzine ; Status:   Documented ; Ordered As Mnemonic:   Vistaril ; Simple Display Line:   Oral, QID, 0 Refill(s) ; Catalog Code:   hydrOXYzine ; Order Dt/Tm:   01/30/2019 19:22:39 EDT          mirtazapine  :   mirtazapine ; Status:   Documented ; Ordered As Mnemonic:   Remeron ; Simple Display Line:   Oral, Once a Day (at bedtime), 0 Refill(s) ; Catalog Code:   mirtazapine ; Order Dt/Tm:   01/30/2019 19:22:27 EDT  venlafaxine  :   venlafaxine ; Status:   Documented ; Ordered As Mnemonic:   Effexor ; Simple Display Line:   Oral, 0 Refill(s) ; Catalog Code:   venlafaxine ; Order Dt/Tm:   01/30/2019 19:22:45 EDT          amLODIPine  :   amLODIPine ; Status:   Documented ; Ordered As Mnemonic:   amLODIPine ; Simple Display Line:   10 mg, Oral, Daily, 0 Refill(s) ; Catalog Code:   amLODIPine ; Order Dt/Tm:   01/30/2019 19:21:31 EDT          clopidogrel  :   clopidogrel ; Status:   Documented ; Ordered As Mnemonic:   Plavix ; Simple Display Line:   Oral, 0 Refill(s) ; Catalog Code:    clopidogrel ; Order Dt/Tm:   01/30/2019 19:21:46 EDT          divalproex sodium  :   divalproex sodium ; Status:   Documented ; Ordered As Mnemonic:   Depakote ; Simple Display Line:   Oral, TID, 0 Refill(s) ; Catalog Code:   divalproex sodium ; Order Dt/Tm:   01/30/2019 19:21:53 EDT

## 2019-01-31 LAB — POC URINALYSIS, CHEMISTRY
Bilirubin, Urine, POC: NEGATIVE
Blood, UA POC: NEGATIVE
Glucose, UA POC: NEGATIVE mg/dL
Ketones, Urine, POC: NEGATIVE mg/dL
Leukocytes, UA: NEGATIVE
Nitrate, UA POC: NEGATIVE
Protein, Urine, POC: NEGATIVE
Specific Gravity, Urine, POC: >=1.03 — AB (ref 1.003–1.035)
UROBILIN U POC: 0.2 EU/dL
pH, Urine, POC: 6 (ref 4.5–8.0)

## 2019-01-31 LAB — URINE DRUG SCREEN
Amphetamine Screen, Urine: NEGATIVE
Barbiturate Screen, Ur: NEGATIVE
Benzodiazepine Screen, Urine: NEGATIVE
Cannabinoid Scrn, Ur: NEGATIVE
Cocaine Screen Urine: NEGATIVE
Opiate Screen, Urine: POSITIVE — AB
PCP Screen, Urine: NEGATIVE

## 2019-01-31 LAB — COVID-19, SURVEILLANCE (ASYMPTOMATIC/NO EXPOSURE, OR TEST OF CURE)
Lot/Kit Number: 706146
SARS Cov2 Ag FIA: NEGATIVE

## 2019-01-31 LAB — POCT GLUCOSE: POC Glucose: 96 mg/dL (ref 70.0–120.0)

## 2019-01-31 LAB — ETHANOL: Ethanol Lvl: NOT DETECTED mg/dL (ref 0.0–10.0)

## 2019-01-31 NOTE — Case Communication (Signed)
CM Discharge Planning Assessment - Text       CM Progress Note Entered On:  01/31/2019 10:01 EDT    Performed On:  01/31/2019 9:59 EDT by Haskel Schroeder               CM Progress Note   CM Progress Note :   01/31/19  CSC:  Chart accessed for review.  [facility] ED CM is providing remote coverage for [facility] today.  CM spoke with Dr. Lendell Caprice regarding this pt.  Pt is new to the area and is homeless.  Requested that CM talk with pt to providing resources for housing/shelter, mental health follow-up, and medication assistance.  CM Gina at [facility] Thelma Barge has kindly agreed to meet with pt in-person to provide resources.  Please refer to her note for additional information.     Haskel Schroeder - 01/31/2019 9:59 EDT

## 2019-01-31 NOTE — ED Notes (Signed)
ED Patient Education Note     Patient Education Materials Follows:  Behavioral Health     Suicidal Feelings: How to Help Yourself    Suicide is the taking of one's own life. If you feel as though life is getting too tough to handle and are thinking about suicide, get help right away. To get help:     Call your local emergency services (911 in the [place]     Call a suicide hotline to speak with a trained counselor who understands how you are feeling. The following is a list of suicide hotlines in the [place]. For a list of hotlines in [place], visit FindSkins.pl.    ? ?1-800-273-TALK 305-713-7398).    ? ?1-800-SUICIDE 3160394684).    ? ?720-746-5316. This is a hotline for Spanish speakers.    ? ?1-800-799-4TTY 813-033-8091). This is a hotline for TTY users.    ? ?1-866-4-U-TREVOR 251-416-9864). This is a hotline for lesbian, gay, bisexual, transgender, or questioning youth.     Contact a crisis center or a local suicide prevention center. To find a crisis center or suicide prevention center:    ? Call your local hospital, clinic, community service organization, mental health center, social service provider, or health department. Ask for assistance in connecting to a crisis center.    ? Visit BankingRep.com.au for a list of crisis centers in the [place], or visit www.suicideprevention.ca/thinking-about-suicide/find-a-crisis-centre for a list of centers in [place].     Visit the following websites:    ? ?National Suicide Prevention Lifeline: www.suicidepreventionlifeline.org    ? ?Hopeline: www.hopeline.com    ? ?[employer] for Suicide Prevention: PromotionalLoans.co.za    ? ?[employer] (for lesbian, gay, bisexual, transgender, or questioning youth): www.thetrevorproject.org    HOW CAN I HELP MYSELF FEEL BETTER?     Promise yourself that you will not do anything drastic when you have suicidal  feelings. Remember, there is hope. Many people have gotten through suicidal thoughts and feelings, and you will, too. You may have gotten through them before, and this proves that you can get through them again.     Let family, friends, teachers, or counselors know how you are feeling. Try not to isolate yourself from those who care about you. Remember, they will want to help you. Talk with someone every day, even if you do not feel sociable. Face-to-face conversation is best.     Call a mental health professional and see one regularly.     Visit your primary health care provider every year.     Eat a well-balanced diet, and space your meals so you eat regularly.     Get plenty of rest.     Avoid alcohol and drugs, and remove them from your home. They will only make you feel worse.     If you are thinking of taking a lot of medicine, give your medicine to someone who can give it to you one day at a time. If you are on antidepressants and are concerned you will overdose, let your health care provider know so he or she can give you safer medicines. Ask your mental health professional about the possible side effects of any medicines you are taking.     Remove weapons, poisons, knives, and anything else that could harm you from your home.     Try to stick to routines. Follow a schedule every day. Put self-care on your schedule.     Make a list of realistic goals, and cross  them off when you achieve them. Accomplishments give a sense of worth.     Wait until you are feeling better before doing the things you find difficult or unpleasant.     Exercise if you are able. You will feel better if you exercise for even a half hour each day.     Go out in the sun or into nature. This will help you recover from depression faster. If you have a favorite place to walk, go there.     Do the things that have always given you pleasure. Play your favorite music, read a good book, paint a picture, play your favorite instrument, or do  anything else that takes your mind off your depression if it is safe to do.     Keep your living space well lit.     When you are feeling well, write yourself a letter about tips and support that you can read when you are not feeling well.     Remember that life's difficulties can be sorted out with help. Conditions can be treated. You can work on thoughts and strategies that serve you well.    This information is not intended to replace advice given to you by your health care provider. Make sure you discuss any questions you have with your health care provider.    Document Released: 10/06/2002 Document Revised: 04/22/2014 Document Reviewed: 07/27/2013  Elsevier Interactive Patient Education ?2016 Elsevier Inc.      Cardiovascular     Nonspecific Chest Pain    It is often hard to find the cause of chest pain. There is always a chance that your pain could be related to something serious, such as a heart attack or a blood clot in your lungs. Chest pain can also be caused by conditions that are not life-threatening. If you have chest pain, it is very important to follow up with your doctor.          HOME CARE     If you were prescribed an antibiotic medicine, finish it all even if you start to feel better.     Avoid any activities that cause chest pain.     Do not use any tobacco products, including cigarettes, chewing tobacco, or electronic cigarettes. If you need help quitting, ask your doctor.     Do not drink alcohol.     Take medicines only as told by your doctor.     Keep all follow-up visits as told by your doctor. This is important. This includes any further testing if your chest pain does not go away.     Your doctor may tell you to keep your head raised (elevated) while you sleep.     Make lifestyle changes as told by your doctor. These may include:    ? Getting regular exercise. Ask your doctor to suggest some activities that are safe for you.    ? Eating a heart-healthy diet. Your doctor or a diet  specialist (dietitian) can help you to learn healthy eating options.    ? Maintaining a healthy weight.    ? Managing diabetes, if necessary.    ? Reducing stress.    GET HELP IF:     Your chest pain does not go away, even after treatment.     You have a rash with blisters on your chest.     You have a fever.    GET HELP RIGHT AWAY IF:     Your chest pain is  worse.      You have an increasing cough, or you cough up blood.     You have severe belly (abdominal) pain.     You feel extremely weak.     You pass out (faint).     You have chills.     You have sudden, unexplained chest discomfort.     You have sudden, unexplained discomfort in your arms, back, neck, or jaw.     You have shortness of breath at any time.     You suddenly start to sweat, or your skin gets clammy.     You feel nauseous.     You vomit.     You suddenly feel light-headed or dizzy.     Your heart begins to beat quickly, or it feels like it is skipping beats.    These symptoms may be an emergency. Do not wait to see if the symptoms will go away. Get medical help right away. Call your local emergency services (911 in the [place] Do not drive yourself to the hospital.    This information is not intended to replace advice given to you by your health care provider. Make sure you discuss any questions you have with your health care provider.    Document Released: 09/18/2007 Document Revised: 04/22/2014 Document Reviewed: 11/05/2013  Elsevier Interactive Patient Education ?2016 Elsevier Inc.      Preventive Health     Aspirin and Your Heart    Aspirin is a medicine that affects the way blood clots. Aspirin can be used to help reduce the risk of blood clots, heart attacks, and other heart-related problems.       SHOULD I TAKE ASPIRIN?    Your health care provider will help you determine whether it is safe and beneficial for you to take aspirin daily. Taking aspirin daily may be beneficial if you:     Have had a heart attack or chest pain.      Have  undergone open heart surgery such as coronary artery bypass surgery (CABG).     Have had coronary angioplasty.     Have experienced a stroke or transient ischemic attack (TIA).     Have peripheral vascular disease (PVD).      Have chronic heart rhythm problems such as atrial fibrillation.    ARE THERE ANY RISKS OF TAKING ASPIRIN DAILY?    Daily use of aspirin can increase your risk of side effects. Some of these include:     Bleeding. Bleeding problems can be minor or serious. An example of a minor problem is a cut that does not stop bleeding. An example of a more serious problem is stomach bleeding or bleeding into the brain. Your risk of bleeding is increased if you are also taking non-steroidal anti-inflammatory medicine (NSAIDs).     Increased bruising.     Upset stomach.     An allergic reaction. People who have nasal polyps have an increased risk of developing an aspirin allergy.     WHAT ARE SOME GUIDELINES I SHOULD FOLLOW WHEN TAKING ASPIRIN?     Take aspirin only as directed by your health care provider. Make sure you understand how much you should take and what form you should take. The two forms of aspirin are:    ? Non-enteric-coated. This type of aspirin does not have a coating and is absorbed quickly. Non-enteric-coated aspirin is usually recommended for people with chest pain. This type of aspirin also comes in a chewable  form.     ? Enteric-coated. This type of aspirin has a special coating that releases the medicine very slowly. Enteric-coated aspirin causes less stomach upset than non-enteric-coated aspirin. This type of aspirin should not be chewed or crushed.     Drink alcohol in moderation. Drinking alcohol increases your risk of bleeding.    WHEN SHOULD I SEEK MEDICAL CARE?     You have unusual bleeding or bruising.      You have stomach pain.      You have an allergic reaction. Symptoms of an allergic reaction include:    ? Hives.    ? Itchy skin.    ? Swelling of the lips, tongue, or face.       You have ringing in your ears.     WHEN SHOULD I SEEK IMMEDIATE MEDICAL CARE?     Your bowel movements are bloody, dark red, or black in color.      You vomit or cough up blood.     You have blood in your urine.     You cough, wheeze, or feel short of breath.     If you have any of the following symptoms, this is an emergency. Do not wait to see if the pain will go away. Get medical help at once. Call your local emergency services (911 in the [place] Do not drive yourself to the hospital.     You have severe chest pain, especially if the pain is crushing or pressure-like and spreads to the arms, back, neck, or jaw.?     You have stroke-like symptoms, such as: ?    ? Loss of vision. ?    ? Difficulty talking. ?    ? Numbness or weakness on one side of your body. ?    ? Numbness or weakness in your arm or leg. ?    ? Not thinking clearly or feeling confused. ?    This information is not intended to replace advice given to you by your health care provider. Make sure you discuss any questions you have with your health care provider.    Document Released: 03/14/2008 Document Revised: 04/22/2014 Document Reviewed: 07/07/2013  Elsevier Interactive Patient Education ?2016 [place].

## 2019-01-31 NOTE — Case Communication (Signed)
CM Discharge Planning Assessment - Text       CM Discharge Plan Entered On:  02/01/2019 9:31 EDT    Performed On:  02/01/2019 9:29 EDT by Barnetta Chapel               CM Discharge Plan   Discharge To :   Home independently, Homeless   CM Progress Note :   01/31/19  CSC:  Chart accessed for review.  [facility] ED CM is providing remote coverage for [facility] today.  CM spoke with Dr. Zola Button regarding this pt.  Pt is new to the area and is homeless.  Requested that CM talk with pt to providing resources for housing/shelter, mental health follow-up, and medication assistance.  CM Gina at [facility] has kindly agreed to meet with pt in-person to provide resources.  Please refer to her note for additional information.    02/01/19 Late Case Note Entry GG: CM met with pt in ED on 01/31/19. Provided housing, substance abuse, mental health, medical, and various other community resources. Encouraged pt to follow up with [facility]. Pt presented stable with positive thoughts and was appreciative. SBAR to MD.     Discharge Planning Assessment Complete :   Yes   Barnetta Chapel - 02/01/2019 9:29 EDT

## 2019-01-31 NOTE — ED Notes (Signed)
ED Patient Summary       ;       [facility] Emergency Department  [address] [place] [address]  [phone]  Discharge Instructions (Patient)  _______________________________________     Name: Dean Rogers, Dean Rogers  DOB: 1971-02-08                   MRN: [id number]                   FIN: [id number]  Reason For Visit: Toma Deiters; GEN MALAISE/ CP  Final Diagnosis: 1:Suicidal ideation; 2:Homelessness; 2:Homelessness; 3:Depression; 4:Anxiety; Chest pain, unspecified; Diabetes; Nonspecific chest pain; Suicidal ideations     Visit Date: 01/30/2019 18:08:00  Address: [place] [address]  Phone: [phone]     Emergency Department Providers:         Primary Physician:   Vivien Presto would like to thank you for allowing [place] to assist you with your healthcare needs. The following includes patient education materials and information regarding your injury/illness.     Follow-up Instructions:  You were seen today on an emergency basis. Please contact your primary care doctor for a follow up appointment. If you received a referral to a specialist doctor, it is important you follow-up as instructed.    It is important that you call your follow-up doctor to schedule and confirm the location of your next appointment. Your doctor may practice at multiple locations. The office location of your follow-up appointment may be different to the one written on your discharge instructions.    If you do not have a primary care doctor, please call [phone] for help in finding a [facility] Provider. For help in finding a specialist doctor, please call [phone].    The [facility] "Ask a Nurse" line in staffed by Registered Nurses and is a free service to the community. We are available Monday - Friday from 8am to 5pm to answer your questions about your health. Please call [phone].    If your condition gets worse before your  follow-up with your primary care doctor or specialist, please return to the Emergency Department.        Follow Up Appointments:  Primary Care Provider:      Name: PCP,  NONE      Phone:                  With: Address: When:   [facility] [address] [place], [place] [address]  [phone] Business (1) Within 2 to 4 days       With: Address: When:   Follow-up in Emergency Department  , only if needed   Comments:   Return to ED if symptoms worsen     Your evaluated by psychiatrist on call and otherwise was given community resources by case management.     You are encouraged to follow-up with [facility] mental health as soon as possible for evaluation of depression and suicidal thoughts              Printed Prescriptions:    Patient Education Materials:  Discharge Orders          Discharge Patient 01/31/19 10:07:00 EDT  Discharge Patient 01/31/19 10:58:00 EDT         Comment:      Suicidal Feelings: How to Help Yourself; Nonspecific Chest  Pain, Easy-to-Read; Aspirin and Your Heart     Suicidal Feelings: How to Help Yourself    Suicide is the taking of one's own life. If you feel as though life is getting too tough to handle and are thinking about suicide, get help right away. To get help:     Call your local emergency services (911 in the [place]     Call a suicide hotline to speak with a trained counselor who understands how you are feeling. The following is a list of suicide hotlines in the [place]. For a list of hotlines in [place], visit InkDistributor.it.    ? ?1-800-273-TALK 838 332 7211).    ? ?1-800-SUICIDE (937)764-5194).    ? ?203-075-3908. This is a hotline for Spanish speakers.    ? ?1-800-799-4TTY (680) 855-9345). This is a hotline for TTY users.    ? ?1-866-4-U-TREVOR 872-812-8920). This is a hotline for lesbian, gay, bisexual, transgender, or questioning youth.     Contact a crisis center or a local suicide  prevention center. To find a crisis center or suicide prevention center:    ? Call your local hospital, clinic, community service organization, mental health center, social service provider, or health department. Ask for assistance in connecting to a crisis center.    ? Visit https://www.patel-king.com/ for a list of crisis centers in the [place], or visit www.suicideprevention.ca/thinking-about-suicide/find-a-crisis-centre for a list of centers in [place].     Visit the following websites:    ? ?National Suicide Prevention Lifeline: www.suicidepreventionlifeline.org    ? ?Hopeline: www.hopeline.com    ? ?[employer] for Suicide Prevention: https://www.ayers.com/    ? ?[employer] (for lesbian, gay, bisexual, transgender, or questioning youth): www.thetrevorproject.org    HOW CAN I HELP MYSELF FEEL BETTER?     Promise yourself that you will not do anything drastic when you have suicidal feelings. Remember, there is hope. Many people have gotten through suicidal thoughts and feelings, and you will, too. You may have gotten through them before, and this proves that you can get through them again.     Let family, friends, teachers, or counselors know how you are feeling. Try not to isolate yourself from those who care about you. Remember, they will want to help you. Talk with someone every day, even if you do not feel sociable. Face-to-face conversation is best.     Call a mental health professional and see one regularly.     Visit your primary health care provider every year.     Eat a well-balanced diet, and space your meals so you eat regularly.     Get plenty of rest.     Avoid alcohol and drugs, and remove them from your home. They will only make you feel worse.     If you are thinking of taking a lot of medicine, give your medicine to someone who can give it to you one day at a time. If you are on antidepressants and are concerned you will overdose, let your health care  provider know so he or she can give you safer medicines. Ask your mental health professional about the possible side effects of any medicines you are taking.     Remove weapons, poisons, knives, and anything else that could harm you from your home.     Try to stick to routines. Follow a schedule every day. Put self-care on your schedule.     Make a list of realistic goals, and cross them off when you achieve them. Accomplishments give  a sense of worth.     Wait until you are feeling better before doing the things you find difficult or unpleasant.     Exercise if you are able. You will feel better if you exercise for even a half hour each day.     Go out in the sun or into nature. This will help you recover from depression faster. If you have a favorite place to walk, go there.     Do the things that have always given you pleasure. Play your favorite music, read a good book, paint a picture, play your favorite instrument, or do anything else that takes your mind off your depression if it is safe to do.     Keep your living space well lit.     When you are feeling well, write yourself a letter about tips and support that you can read when you are not feeling well.     Remember that life's difficulties can be sorted out with help. Conditions can be treated. You can work on thoughts and strategies that serve you well.    This information is not intended to replace advice given to you by your health care provider. Make sure you discuss any questions you have with your health care provider.    Document Released: 10/06/2002 Document Revised: 04/22/2014 Document Reviewed: 07/27/2013  Elsevier Interactive Patient Education ?2016 Elsevier Inc.       Nonspecific Chest Pain    It is often hard to find the cause of chest pain. There is always a chance that your pain could be related to something serious, such as a heart attack or a blood clot in your lungs. Chest pain can also be caused by conditions that are not  life-threatening. If you have chest pain, it is very important to follow up with your doctor.          HOME CARE     If you were prescribed an antibiotic medicine, finish it all even if you start to feel better.     Avoid any activities that cause chest pain.     Do not use any tobacco products, including cigarettes, chewing tobacco, or electronic cigarettes. If you need help quitting, ask your doctor.     Do not drink alcohol.     Take medicines only as told by your doctor.     Keep all follow-up visits as told by your doctor. This is important. This includes any further testing if your chest pain does not go away.     Your doctor may tell you to keep your head raised (elevated) while you sleep.     Make lifestyle changes as told by your doctor. These may include:    ? Getting regular exercise. Ask your doctor to suggest some activities that are safe for you.    ? Eating a heart-healthy diet. Your doctor or a diet specialist (dietitian) can help you to learn healthy eating options.    ? Maintaining a healthy weight.    ? Managing diabetes, if necessary.    ? Reducing stress.    GET HELP IF:     Your chest pain does not go away, even after treatment.     You have a rash with blisters on your chest.     You have a fever.    GET HELP RIGHT AWAY IF:     Your chest pain is worse.      You have an increasing cough, or you  cough up blood.     You have severe belly (abdominal) pain.     You feel extremely weak.     You pass out (faint).     You have chills.     You have sudden, unexplained chest discomfort.     You have sudden, unexplained discomfort in your arms, back, neck, or jaw.     You have shortness of breath at any time.     You suddenly start to sweat, or your skin gets clammy.     You feel nauseous.     You vomit.     You suddenly feel light-headed or dizzy.     Your heart begins to beat quickly, or it feels like it is skipping beats.    These symptoms may be an emergency. Do not wait to see if the symptoms will  go away. Get medical help right away. Call your local emergency services (911 in the [place] Do not drive yourself to the hospital.    This information is not intended to replace advice given to you by your health care provider. Make sure you discuss any questions you have with your health care provider.    Document Released: 09/18/2007 Document Revised: 04/22/2014 Document Reviewed: 11/05/2013  Elsevier Interactive Patient Education ?2016 Elsevier Inc.       Aspirin and Your Heart    Aspirin is a medicine that affects the way blood clots. Aspirin can be used to help reduce the risk of blood clots, heart attacks, and other heart-related problems.       SHOULD I TAKE ASPIRIN?    Your health care provider will help you determine whether it is safe and beneficial for you to take aspirin daily. Taking aspirin daily may be beneficial if you:     Have had a heart attack or chest pain.      Have undergone open heart surgery such as coronary artery bypass surgery (CABG).     Have had coronary angioplasty.     Have experienced a stroke or transient ischemic attack (TIA).     Have peripheral vascular disease (PVD).      Have chronic heart rhythm problems such as atrial fibrillation.    ARE THERE ANY RISKS OF TAKING ASPIRIN DAILY?    Daily use of aspirin can increase your risk of side effects. Some of these include:     Bleeding. Bleeding problems can be minor or serious. An example of a minor problem is a cut that does not stop bleeding. An example of a more serious problem is stomach bleeding or bleeding into the brain. Your risk of bleeding is increased if you are also taking non-steroidal anti-inflammatory medicine (NSAIDs).     Increased bruising.     Upset stomach.     An allergic reaction. People who have nasal polyps have an increased risk of developing an aspirin allergy.     WHAT ARE SOME GUIDELINES I SHOULD FOLLOW WHEN TAKING ASPIRIN?     Take aspirin only as directed by your health care provider. Make sure you  understand how much you should take and what form you should take. The two forms of aspirin are:    ? Non-enteric-coated. This type of aspirin does not have a coating and is absorbed quickly. Non-enteric-coated aspirin is usually recommended for people with chest pain. This type of aspirin also comes in a chewable form.     ? Enteric-coated. This type of aspirin has a special coating that releases  the medicine very slowly. Enteric-coated aspirin causes less stomach upset than non-enteric-coated aspirin. This type of aspirin should not be chewed or crushed.     Drink alcohol in moderation. Drinking alcohol increases your risk of bleeding.    WHEN SHOULD I SEEK MEDICAL CARE?     You have unusual bleeding or bruising.      You have stomach pain.      You have an allergic reaction. Symptoms of an allergic reaction include:    ? Hives.    ? Itchy skin.    ? Swelling of the lips, tongue, or face.      You have ringing in your ears.     WHEN SHOULD I SEEK IMMEDIATE MEDICAL CARE?     Your bowel movements are bloody, dark red, or black in color.      You vomit or cough up blood.     You have blood in your urine.     You cough, wheeze, or feel short of breath.     If you have any of the following symptoms, this is an emergency. Do not wait to see if the pain will go away. Get medical help at once. Call your local emergency services (911 in the [place] Do not drive yourself to the hospital.     You have severe chest pain, especially if the pain is crushing or pressure-like and spreads to the arms, back, neck, or jaw.?     You have stroke-like symptoms, such as: ?    ? Loss of vision. ?    ? Difficulty talking. ?    ? Numbness or weakness on one side of your body. ?    ? Numbness or weakness in your arm or leg. ?    ? Not thinking clearly or feeling confused. ?    This information is not intended to replace advice given to you by your health care provider. Make sure you discuss any questions you have with your health care  provider.    Document Released: 03/14/2008 Document Revised: 04/22/2014 Document Reviewed: 07/07/2013  Elsevier Interactive Patient Education ?2016 [employer]         Allergy Info: Toradol; Ceftin; Reglan; penicillin     Medication Information:  [facility] [place] [facility] ED Physicians provided you with a complete list of medications post discharge, if you have been instructed to stop taking a medication please ensure you also follow up with this information to your Primary Care Physician.  Unless otherwise noted, patient will continue to take medications as prescribed prior to the Emergency Room visit.  Any specific questions regarding your chronic medications and dosages should be discussed with your physician(s) and pharmacist.          amLODIPine 10 Milligram Oral (given by mouth) every day.  busPIRone Oral (given by mouth) 2 times a day.  clopidogrel (Plavix) Oral (given by mouth).  divalproex sodium (Depakote) Oral (given by mouth) 3 times a day.  gabapentin (gabapentin 300 mg oral capsule) 1 Capsules Oral (given by mouth) 3 times a day for 7 Days. Refills: 0.  hydrOXYzine (Vistaril 25 mg oral capsule) 1 Capsules Oral (given by mouth) 2 times a day as needed as needed for anxiety for 7 Days. Refills: 0.  hydrOXYzine (Vistaril) Oral (given by mouth) 4 times a day.  mirtazapine (Remeron 15 mg oral tablet) 1 Tabs Oral (given by mouth) Once a Day (at bedtime) for 7 Days. Refills: 0.  mirtazapine (Remeron) Oral (given by mouth) Once  a Day (at bedtime).  venlafaxine (Effexor) Oral (given by mouth).      Medications Administered During Visit:              Medication Dose Route   Sodium Chloride 0.9% 1000 mL IV Piggyback   ondansetron 4 mg IV Push   acetaminophen 650 mg Oral   aspirin 324 mg Chewed          Major Tests and Procedures:  The following procedures and tests were performed during your ED visit.  COMMON PROCEDURES%>  COMMON PROCEDURES COMMENTS%>          Laboratory Orders  Name Status Details   .Glu POC  Completed Blood, RT, RT - Routine, Collected, 01/31/19 7:14:31 EDT, Nurse collect, 01/31/19 7:14:31 [facility], IT1000 POC Login   .SARS COV2 Ag FIA Completed Nasal Swab, Stat, ST - Stat, 01/30/19 20:55:00 EDT, 01/30/19 20:55:00 EDT, Nurse collect, Willey Blade M, Print label Y/N   .UA POC Completed Urine, RT, RT - Routine, Collected, 01/30/19 20:25:00 EDT, Nurse collect, 01/30/19 20:25:00 [facility], RAL POC Login   Add On Ordered Blood, Stat, ST - Stat, Collected, 01/30/19 23:48:00 EDT, 01/30/19 23:48:00 EDT, Willey Blade M, Print label Y/N, ETOH level, Draw Stat   CBCDIFF Completed Blood, Stat, ST - Stat, 01/30/19 18:52:00 EDT, 01/30/19 18:52:00 EDT, Nurse collect, Willey Blade M, Print label Y/N   CMP Completed Blood, Stat, ST - Stat, 01/30/19 18:52:00 EDT, 01/30/19 18:52:00 EDT, Nurse collect, Morton Stall, Print label Y/N   Ethanol Completed Blood, Stat, ST - Stat, Collected, 01/30/19 19:02:00 EDT [id number], 01/30/19 19:02:00 EDT, Nurse collect, Venous Draw, 01/30/19 23:54:00 EDT, SF CP Login, YOUNG-DO,  KELLI M, Print label Y/N, sf_laboratory_1, 4 mL SST PST/*B*/   Trop T Completed Blood, Stat, ST - Stat, 01/30/19 18:52:00 EDT, 01/30/19 18:52:00 EDT, Nurse collect, Morton Stall, Print label Y/N   U Drug Completed Urine, Stat, ST - Stat, 01/30/19 23:48:00 EDT, 01/30/19 23:48:00 EDT, Nurse collect   Joselyn Glassman Completed Blood, Stat, ST - Stat, Collected, 01/30/19 19:02:00 EDT SACIDO, 01/30/19 19:02:00 EDT, Nurse collect, Venous Draw, 01/30/19 19:07:00 EDT, SF CP Login, YOUNG-DO,  KELLI M, Print label Y/N, sf_laboratory_1, 1.8 mL EAVW/*[id number]*/, Complete               Radiology Orders  Name Status Details   XR Chest 1 View Portable Completed 01/30/19 18:52:00 EDT, STAT 1 hour or less, Reason: Chest pain, Transport Mode: Portable, pp_set_radiology_subspecialty               Patient Care Orders  Name Status Details   Behavioral Health Precautions Completed 01/30/19 23:48:00 EDT, Once,  01/30/19 23:48:00 EDT   COVID-19 Status Ordered 01/30/19 20:55:08 EDT, NOT VALID FOR pharmacy, laboratory, radiology., 01/30/19 20:55:08 EDT, COVID-19 Not Detected   Cardiac/NIBP/Pulse Ox Monitoring Completed 01/30/19 18:52:00 EDT, This message can only be seen by Nursing, it is not visible to Pharmacy, Laboratory, or Radiology., 01/30/19 18:52:00 EDT, 01/30/19 18:52:00 EDT, Once   Communication to Nursing Completed 01/30/19 23:48:00 EDT, Document Valuables and Belongings on [facility] form, 01/30/19 23:48:00 EDT, 01/30/19 23:48:00 EDT   DC ISO Order/Icons Ordered 01/30/19 21:35:53 EDT, 01/30/19 21:35:53 EDT   Discharge Patient Ordered 01/31/19 10:07:00 EDT   Discharge Patient Ordered 01/31/19 10:58:00 EDT   ED Assessment Adult Completed 01/30/19 18:20:09 EDT, 01/30/19 18:20:09 EDT   ED Secondary Triage Completed 01/30/19 18:20:09 EDT, 01/30/19 18:20:09 EDT   ED Triage Adult Completed 01/30/19 18:08:48 EDT, 01/30/19 18:08:48 EDT  Notify Provider Completed 01/30/19 20:55:08 EDT, This message can only be seen by Nursing, it is not visible to Pharmacy, Laboratory, or Radiology., 01/30/19 20:55:08 EDT   POC-Urine Dipstick collect Completed 01/30/19 18:52:00 EDT, Once, 01/30/19 18:52:00 EDT   Saline Lock Insert Completed 01/30/19 18:52:00 EDT, Once, 01/30/19 18:52:00 EDT   Security to Bedside Completed 01/30/19 23:48:00 EDT, This message can only be seen by Nursing, it is not visible to Pharmacy, Laboratory, or Radiology., 01/30/19 23:48:00 EDT, 01/30/19 23:48:00 EDT, Once       ---------------------------------------------------------------------------------------------------------------------  [facility]) encourages you to self-enroll in the [facility] Patient Portal.  [facility] Patient Portal will allow you to manage your personal health information securely from your own electronic device now and in the future.  To begin your Patient Portal enrollment process, please visit  [email or web address]. Click on "Sign up now" under [facility].  If you find that you need additional assistance on the [facility] Patient Portal or need a copy of your medical records, please call the [facility] Medical Records Office at [phone].  Comment:

## 2019-01-31 NOTE — ED Provider Notes (Signed)
Addendum *ED        Patient:   Dean Rogers, Dean Rogers            MRN: [id number]            FIN: [id number]               Age:   48 years     Sex:  Male     DOB:  Mar 07, 1971   Associated Diagnoses:   Suicidal ideations   Author:   Caleb Popp      Basic Information   Addendum: Time of addendum:: 01/31/2019 10:00:00 , Assumed care from: [facility],  ADAM HOWARD, Pertinent history: Patient awaiting consult.      Medical Decision Making   Results review:  Lab results : Lab View   01/31/2019 7:14 EDT Glucose POC 96.0 mg/dL   01/30/2019 23:58 EDT Amphetamine U Negative    Barbiturate U Negative    Benzodiazepin U Negative    Cannabinoid U Negative    Cocaine U Negative    Opiate U Positive    Phencylidine U Negative   01/30/2019 21:11 EDT SARS Cov2 Ag FIA Negative   01/30/2019 20:25 EDT Appear U POC Clear    Color U POC Yellow    Bili U POC Negative    Blood U POC Negative    Glucose U POC Negative mg/dL    Ketones U POC Negative mg/dL    Leuk Est U POC Negative    Nitrite U POC Negative    pH U POC 6.0    Protein U POC Negative    Spec Grav U POC >=1.030    Urobilin U POC 0.2 EU/dL   01/30/2019 19:40 EDT Estimated Creatinine Clearance 112.85 mL/min   01/30/2019 19:02 EDT WBC 5.0 x10e3/mcL    RBC 4.76 x10e6/mcL    Hgb 13.3 g/dL    HCT 42.0 %    MCV 88.2 fL    MCH 27.9 pg    MCHC 31.7 g/dL  LOW    RDW 14.6 %    Platelet 245 x10e3/mcL    MPV 8.9 fL    Neutro Auto 56.5 %    Neutro Absolute 2.8 x10e3/mcL    Immature Grans Percent 0.4 %    Immature Grans Absolute 0.02 x10e3/mcL    Lymph Auto 29.7 %    Lymph Absolute 1.5 x10e3/mcL    Mono Auto 11.8 %    Mono Absolute 0.6 x10e3/mcL    Eosinophil Percent 1.2 %    Eos Absolute 0.1 x10e3/mcL    Basophil Auto 0.4 %    Baso Absolute 0.0 x10e3/mcL    NRBC Absolute Auto 0.000 x10e3/mcL    NRBC Percent Auto 0.0 %    Sodium Lvl 137 mmol/L    Potassium Lvl 4.1 mmol/L    Chloride 101 mmol/L    CO2 23 mmol/L    Glucose Random 118 mg/dL  HI    BUN 12 mg/dL    Creatinine Lvl 1.0 mg/dL    AGAP 13  mmol/L    Osmolality Calc 274 mOsm/kg    Calcium Lvl 8.8 mg/dL    Protein Total 6.6 g/dL    Albumin Lvl 4.1 g/dL    Globulin Calc 2.5 g/dL    AG Ratio Calc 1.64 mmol/L    Alk Phos 70 unit/L    AST 50 unit/L  HI    ALT 48 unit/L  HI    eGFR AA 104 mL/min/1.57m   eGFR Non-AA  90 mL/min/1.44m   Bili Total 0.30 mg/dL    Trop T Quant <0.010 ng/mL    Ethanol Lvl None Detected mg/dL   .   Radiology results:  Rad Results (ST)   XR Chest 1 View Portable  ?  01/30/19 19:41:41  CHEST: AP (or PA), 01/30/2019 7:35 PM    INDICATION: Chest pain    COMPARISON: None    FINDINGS: Cardiac silhouette is not enlarged. Pulmonary vasculature is not  increased. No pulmonary edema or pneumonia is seen. No pleural fluid is  identified.      IMPRESSION:    No acute process identified.  ?  Signed By: MBrandon Melnick     Reexamination/ Reevaluation   Vital signs   Basic Oxygen Information   01/31/2019 6:34 EDT SpO2 94 %    Oxygen Therapy Room air   01/31/2019 3:56 EDT SpO2 95 %    Oxygen Therapy Room air   01/30/2019 23:00 EDT SpO2 94 %    Oxygen Therapy Room air   01/30/2019 22:00 EDT SpO2 92 %    Oxygen Therapy Room air   01/30/2019 21:30 EDT SpO2 97 %    Oxygen Therapy Room air   01/30/2019 21:00 EDT SpO2 96 %    Oxygen Therapy Room air   01/30/2019 20:30 EDT SpO2 97 %    Oxygen Therapy Room air   01/30/2019 20:16 EDT SpO2 98 %    Oxygen Therapy Room air   01/30/2019 19:47 EDT SpO2 98 %    Oxygen Therapy Room air   01/30/2019 18:15 EDT SpO2 95 %    Oxygen Therapy Room air      This patient presented last evening with a history of generalized malaise in the setting of being homeless.  The patient had requested that he did not have enough food to eat and otherwise wanted resources as he had recently moved to [place]  The patient was actually interviewed by psychiatrist on call including Dr. CZola Buttonthis morning and felt to be a candidate for discharge after case management consultation for resources within the community.  The  patient was not felt to be a threat to himself or others.  He was otherwise worked up for chest pain last night and was found to have no acute findings.  Patient is medically stable for discharge otherwise.  Case management was able to provide him with resources.    Dr. CZola Buttonalso recommends that this patient be placed back on Remeron/Vistaril/gabapentin for 1 week supply.           Impression and Plan   Diagnosis   Suicidal ideations (ICD10-CM R45.851, Discharge, Medical)   Plan   Condition: Improved, Stable.    Disposition: Discharged: Time  01/31/2019 10:05:00, to home.    Prescriptions: Launch prescriptions   Pharmacy:  gabapentin 300 mg oral capsule (Prescribe): 300 mg, 1 caps, Oral, TID, for 7 days, 21 caps, 0 Refill(s)  Vistaril 25 mg oral capsule (Prescribe): 25 mg, 1 caps, Oral, BID, for 7 days, PRN: as needed for anxiety, 14 caps, 0 Refill(s)  Remeron 15 mg oral tablet (Prescribe): 15 mg, 1 tabs, Oral, Once a Day (at bedtime), for 7 days, 7 tabs, 0 Refill(s)  .    Patient was given the following educational materials: Aspirin and Your Heart, Nonspecific Chest Pain, Easy-to-Read, Suicidal Feelings: How to Help Yourself, Suicidal Feelings: How to Help Yourself, Nonspecific Chest Pain, Easy-to-Read, Aspirin and Your Heart.    Follow up  with: Follow-up in Emergency Department , only if needed Return to ED if symptoms worsen    Your evaluated by psychiatrist on call and otherwise was given community resources by case management.    You are encouraged to follow-up with [facility] mental health as soon as possible for evaluation of depression and suicidal thoughts; [facility] Within 2 to 4 days.    Counseled: Patient, Regarding diagnosis, Regarding diagnostic results, Regarding treatment plan, Regarding prescription.    Signature Line     Electronically Signed on 01/31/2019 10:07 AM EDT   ________________________________________________   Caleb Popp      Electronically Signed on  01/31/2019 10:58 AM EDT   ________________________________________________   Caleb Popp            Modified by: Caleb Popp on 01/31/2019 10:58 AM EDT

## 2019-01-31 NOTE — ED Notes (Signed)
ED Note-Nursing               pt was discharged and left this facility leaving behind his discharge paperwork that included Rxs; attempt to contact patient by phone was made by Georg Ruddle but patient listed this facility ED as his contact phone number; paperwork left up from i n the lobby with tech  [employer]

## 2019-01-31 NOTE — Discharge Summary (Signed)
ED Clinical Summary                     [facility]  [address] [place] [address]  [phone]          PERSON INFORMATION  Name: Dean Rogers, Dean Rogers Age:  48 Years DOB: Jan 28, 1971   Sex: Male Language: [place] PCP: PCP,  NONE   Marital Status: Single Phone: [phone] Med Service: MED-Medicine   MRN: [id number] Acct# [id number] Arrival: 01/30/2019 18:08:00   Visit Reason: Lucky Rathke; GEN MALAISE/ CP Acuity: 3 LOS: 000 16:51   Address:    [address] [place] [address]   Diagnosis:    1:Suicidal ideation; 2:Homelessness; 2:Homelessness; 3:Depression; 4:Anxiety; Chest pain, unspecified; Diabetes; Nonspecific chest pain; Suicidal ideations  Medications:          New Medications  Printed Prescriptions  gabapentin (gabapentin 300 mg oral capsule) 1 Capsules Oral (given by mouth) 3 times a day for 7 Days. Refills: 0.  Last Dose:____________________  Medications That Were Updated - Follow Current Instructions  Printed Prescriptions  Current hydrOXYzine (Vistaril 25 mg oral capsule) 1 Capsules Oral (given by mouth) 2 times a day as needed as needed for anxiety for 7 Days. Refills: 0.  Last Dose:____________________    Current mirtazapine (Remeron 15 mg oral tablet) 1 Tabs Oral (given by mouth) Once a Day (at bedtime) for 7 Days. Refills: 0.  Last Dose:____________________    Other Medications  Current hydrOXYzine (Vistaril) Oral (given by mouth) 4 times a day.  Last Dose:____________________    Current mirtazapine (Remeron) Oral (given by mouth) Once a Day (at bedtime).  Last Dose:____________________    Medications that have not changed  Other Medications  amLODIPine 10 Milligram Oral (given by mouth) every day.  Last Dose:____________________  busPIRone Oral (given by mouth) 2 times a day.  Last Dose:____________________  clopidogrel (Plavix) Oral (given by mouth).  Last Dose:____________________  divalproex sodium (Depakote) Oral (given by mouth) 3 times a day.  Last  Dose:____________________  venlafaxine (Effexor) Oral (given by mouth).  Last Dose:____________________      Medications Administered During Visit:                Medication Dose Route   Sodium Chloride 0.9% 1000 mL IV Piggyback   ondansetron 4 mg IV Push   acetaminophen 650 mg Oral   aspirin 324 mg Chewed               Allergies      Ceftin (Hives)      Reglan (Hives)      penicillin (Hives)      Toradol (Hives)      Major Tests and Procedures:  The following procedures and tests were performed during your ED visit.  COMMON PROCEDURES%>  COMMON PROCEDURES COMMENTS%>                PROVIDER INFORMATION               Provider Role Assigned Irven Coe, RNBarnabas Lister ED Nurse 01/30/2019 18:24:02 01/31/2019 01:32:26   Raynelle Dick M ED Provider 01/30/2019 18:32:52    Johny Shears, RN, Marcy Siren ED Nurse 01/30/2019 19:17:13 01/31/2019 01:23:26   MANDEL-DO, ADAM HOWARD ED Provider 01/30/2019 23:50:56 01/31/2019 06:09:36   Hoffer, RN, Armond Hang ED Nurse 01/31/2019 01:32:27    WILL, RN, Hildred Priest ED Nurse 01/31/2019 03:46:39 01/31/2019 07:04:37   CHAG-MD, Eusebio Me ED Provider 01/31/2019 06:09:37  Helene Kelp, RN, Barnetta Chapel ED Nurse 01/31/2019 07:04:38        Attending Physician:  Crecencio Mc      Admit Doc  Raynelle Dick M     Consulting Doc  COKER-MD,  Virgilio Belling INFORMATION  Vital Sign Triage Latest   Temp Oral ORAL_1%> ORAL%>   Temp Temporal TEMPORAL_1%> TEMPORAL%>   Temp Intravascular INTRAVASCULAR_1%> INTRAVASCULAR%>   Temp Axillary AXILLARY_1%> AXILLARY%>   Temp Rectal RECTAL_1%> RECTAL%>   02 Sat 95 % 94 %   Respiratory Rate RATE_1%> RATE%>   Peripheral Pulse Rate PULSE RATE_1%>100 bpm PULSE RATE%>   Apical Heart Rate HEART RATE_1%> HEART RATE%>   Blood Pressure BLOOD PRESSURE_1%>/ BLOOD PRESSURE_1%>88 mmHg BLOOD PRESSURE%> / BLOOD PRESSURE%>66 mmHg                 Immunizations      No Immunizations Documented This Visit          DISCHARGE INFORMATION   Discharge Disposition: H Outpt-Sent Home    Discharge Location:  Home   Discharge Date and Time:  01/31/2019 10:59:09   ED Checkout Date and Time:  01/31/2019 10:59:09     DEPART REASON INCOMPLETE INFORMATION               Depart Action Incomplete Reason   Interactive View/I&O Recently assessed               Problems      No Problems Documented              Smoking Status      No Smoking Status Documented         PATIENT EDUCATION INFORMATION  Instructions:     Suicidal Feelings: How to Help Yourself; Nonspecific Chest Pain, Easy-to-Read; Aspirin and Your Heart     Follow up:                   With: Address: When:   [facility] [address] [place] [address]  [phone] Business (1) Within 2 to 4 days       With: Address: When:   Follow-up in Emergency Department  , only if needed   Comments:   Return to ED if symptoms worsen     Your evaluated by psychiatrist on call and otherwise was given community resources by case management.     You are encouraged to follow-up with [facility] mental health as soon as possible for evaluation of depression and suicidal thoughts              ED PROVIDER DOCUMENTATION     Patient:   Dean Rogers, Dean Rogers            MRN: [id number]            FIN: [id number]               Age:   48 years     Sex:  Male     DOB:  28-Apr-1970   Associated Diagnoses:   Suicidal ideations   Author:   Caleb Popp      Basic Information   Addendum: Time of addendum:: 01/31/2019 10:00:00 , Assumed care from: [facility],  ADAM HOWARD, Pertinent history: Patient awaiting consult.      Medical Decision Making   Results review:  Lab results : Lab View   01/31/2019 7:14 EDT Glucose POC 96.0 mg/dL   01/30/2019 23:58 EDT  Amphetamine U Negative    Barbiturate U Negative    Benzodiazepin U Negative    Cannabinoid U Negative    Cocaine U Negative    Opiate U Positive    Phencylidine U Negative   01/30/2019 21:11 EDT SARS Cov2 Ag FIA Negative   01/30/2019 20:25 EDT Appear U POC Clear    Color U POC Yellow    Bili U POC  Negative    Blood U POC Negative    Glucose U POC Negative mg/dL    Ketones U POC Negative mg/dL    Leuk Est U POC Negative    Nitrite U POC Negative    pH U POC 6.0    Protein U POC Negative    Spec Grav U POC >=1.030    Urobilin U POC 0.2 EU/dL   01/30/2019 19:40 EDT Estimated Creatinine Clearance 112.85 mL/min   01/30/2019 19:02 EDT WBC 5.0 x10e3/mcL    RBC 4.76 x10e6/mcL    Hgb 13.3 g/dL    HCT 42.0 %    MCV 88.2 fL    MCH 27.9 pg    MCHC 31.7 g/dL  LOW    RDW 14.6 %    Platelet 245 x10e3/mcL    MPV 8.9 fL    Neutro Auto 56.5 %    Neutro Absolute 2.8 x10e3/mcL    Immature Grans Percent 0.4 %    Immature Grans Absolute 0.02 x10e3/mcL    Lymph Auto 29.7 %    Lymph Absolute 1.5 x10e3/mcL    Mono Auto 11.8 %    Mono Absolute 0.6 x10e3/mcL    Eosinophil Percent 1.2 %    Eos Absolute 0.1 x10e3/mcL    Basophil Auto 0.4 %    Baso Absolute 0.0 x10e3/mcL    NRBC Absolute Auto 0.000 x10e3/mcL    NRBC Percent Auto 0.0 %    Sodium Lvl 137 mmol/L    Potassium Lvl 4.1 mmol/L    Chloride 101 mmol/L    CO2 23 mmol/L    Glucose Random 118 mg/dL  HI    BUN 12 mg/dL    Creatinine Lvl 1.0 mg/dL    AGAP 13 mmol/L    Osmolality Calc 274 mOsm/kg    Calcium Lvl 8.8 mg/dL    Protein Total 6.6 g/dL    Albumin Lvl 4.1 g/dL    Globulin Calc 2.5 g/dL    AG Ratio Calc 1.64 mmol/L    Alk Phos 70 unit/L    AST 50 unit/L  HI    ALT 48 unit/L  HI    eGFR AA 104 mL/min/1.17m   eGFR Non-AA 90 mL/min/1.737m  Bili Total 0.30 mg/dL    Trop T Quant <0.010 ng/mL    Ethanol Lvl None Detected mg/dL   .   Radiology results:  Rad Results (ST)   XR Chest 1 View Portable  ?  01/30/19 19:41:41  CHEST: AP (or PA), 01/30/2019 7:35 PM    INDICATION: Chest pain    COMPARISON: None    FINDINGS: Cardiac silhouette is not enlarged. Pulmonary vasculature is not  increased. No pulmonary edema or pneumonia is seen. No pleural fluid is  identified.      IMPRESSION:    No acute process identified.  ?  Signed By: MEBrandon Melnick    Reexamination/  Reevaluation   Vital signs   Basic Oxygen Information   01/31/2019 6:34 EDT SpO2 94 %    Oxygen Therapy Room air  01/31/2019 3:56 EDT SpO2 95 %    Oxygen Therapy Room air   01/30/2019 23:00 EDT SpO2 94 %    Oxygen Therapy Room air   01/30/2019 22:00 EDT SpO2 92 %    Oxygen Therapy Room air   01/30/2019 21:30 EDT SpO2 97 %    Oxygen Therapy Room air   01/30/2019 21:00 EDT SpO2 96 %    Oxygen Therapy Room air   01/30/2019 20:30 EDT SpO2 97 %    Oxygen Therapy Room air   01/30/2019 20:16 EDT SpO2 98 %    Oxygen Therapy Room air   01/30/2019 19:47 EDT SpO2 98 %    Oxygen Therapy Room air   01/30/2019 18:15 EDT SpO2 95 %    Oxygen Therapy Room air      This patient presented last evening with a history of generalized malaise in the setting of being homeless.  The patient had requested that he did not have enough food to eat and otherwise wanted resources as he had recently moved to [place].  The patient was actually interviewed by psychiatrist on call including Dr. Zola Button this morning and felt to be a candidate for discharge after case management consultation for resources within the community.  The patient was not felt to be a threat to himself or others.  He was otherwise worked up for chest pain last night and was found to have no acute findings.  Patient is medically stable for discharge otherwise.  Case management was able to provide him with resources.    Dr. Zola Button also recommends that this patient be placed back on Remeron/Vistaril/gabapentin for 1 week supply.           Impression and Plan   Diagnosis   Suicidal ideations (ICD10-CM R45.851, Discharge, Medical)   Plan   Condition: Improved, Stable.    Disposition: Discharged: Time  01/31/2019 10:05:00, to home.    Prescriptions: Launch prescriptions   Pharmacy:  gabapentin 300 mg oral capsule (Prescribe): 300 mg, 1 caps, Oral, TID, for 7 days, 21 caps, 0 Refill(s)  Vistaril 25 mg oral capsule (Prescribe): 25 mg, 1 caps, Oral, BID, for 7 days, PRN: as needed for  anxiety, 14 caps, 0 Refill(s)  Remeron 15 mg oral tablet (Prescribe): 15 mg, 1 tabs, Oral, Once a Day (at bedtime), for 7 days, 7 tabs, 0 Refill(s)  .    Patient was given the following educational materials: Aspirin and Your Heart, Nonspecific Chest Pain, Easy-to-Read, Suicidal Feelings: How to Help Yourself, Suicidal Feelings: How to Help Yourself, Nonspecific Chest Pain, Easy-to-Read, Aspirin and Your Heart.    Follow up with: Follow-up in Emergency Department , only if needed Return to ED if symptoms worsen    Your evaluated by psychiatrist on call and otherwise was given community resources by case management.    You are encouraged to follow-up with [facility] mental health as soon as possible for evaluation of depression and suicidal thoughts; [facility] Within 2 to 4 days.    Counseled: Patient, Regarding diagnosis, Regarding diagnostic results, Regarding treatment plan, Regarding prescription.       Patient:   Dean Rogers, Dean Rogers            MRN: [id number]            FIN: [id number]               Age:   76 years     Sex:  Male     DOB:  1970-07-29  Associated Diagnoses:   None   Author:   Scherrie Bateman,  ADAM HOWARD      Medical Decision Making   This patient was signed out to me change pending the completion of a case management evaluation.  Patient did not require any interventions throughout my shift.  He has remained comfortable.  The patient will be signed out at change of shift in the morning pending case management and psychiatric evaluation.      Reexamination/ Reevaluation   Vital signs   Basic Oxygen Information   01/31/2019 3:56 EDT SpO2 95 %    Oxygen Therapy Room air   01/30/2019 23:00 EDT SpO2 94 %    Oxygen Therapy Room air   01/30/2019 22:00 EDT SpO2 92 %    Oxygen Therapy Room air   01/30/2019 21:30 EDT SpO2 97 %    Oxygen Therapy Room air   01/30/2019 21:00 EDT SpO2 96 %    Oxygen Therapy Room air   01/30/2019 20:30 EDT SpO2 97 %    Oxygen Therapy Room air   01/30/2019 20:16 EDT  SpO2 98 %    Oxygen Therapy Room air   01/30/2019 19:47 EDT SpO2 98 %    Oxygen Therapy Room air   01/30/2019 18:15 EDT SpO2 95 %    Oxygen Therapy Room air         Patient:   Dean Rogers, Dean Rogers            MRN: [id number]            FIN: [id number]               Age:   38 years     Sex:  Male     DOB:  04-27-1970   Associated Diagnoses:   Nonspecific chest pain; Suicidal ideation; Homelessness; Diabetes   Author:   Crecencio Mc      Basic Information   Time seen: Provider Seen (ST)   ED Provider/Time:    Crecencio Mc / 01/30/2019 18:32  .   History source: Patient.   Arrival mode: Ambulance.   History limitation: None.   Additional information: Chief Complaint from Nursing Triage Note   Chief Complaint  Chief Complaint: Generalized mallaise after walking all day.  Reports chest pain upon arrival to ED.  Treated at [facility] last night for same. (01/30/19 18:15:00).      History of Present Illness   48 year old male presents emergency department complaining of generalized malaise.  He states he has been walking all day and has not had anything to eat or drink.  He also mentions that he has diabetes.  On arrival to the ED he started complaining of chest pain.  Patient states he was treated at [facility] for the same last night.  He has prescriptions for aspirin, Plavix, and Lipitor from his ED visit last night.  He states he was there for chest pain but they discharged him.  Patient is new to [place] and does not have any place to go.  He states he came here to "better his life".      Review of Systems   Constitutional symptoms:  Negative except as documented in HPI.   Skin symptoms:  No rash,    Eye symptoms:  Vision unchanged.   ENMT symptoms:  No sore throat, no nasal congestion.    Respiratory symptoms:  No shortness of breath, no cough.    Cardiovascular symptoms:  Chest pain, No palpitations,  Gastrointestinal symptoms:  Nausea, no abdominal pain, no vomiting, no diarrhea, no rectal bleeding.     Genitourinary symptoms:  No dysuria, no hematuria.    Musculoskeletal symptoms:  No back pain,    Neurologic symptoms:  No headache,       Health Status   Allergies:    Allergic Reactions (Selected)  Moderate  Ceftin- Hives.  Penicillin- Hives.  Reglan- Hives.  Toradol- Hives..      Past Medical/ Family/ Social History   Medical history: Reviewed as documented in chart.   Surgical history: Reviewed as documented in chart.   Family history: Not significant.   Social history: Reviewed as documented in chart.   Problem list:    Active Problems (5)  CAD (coronary artery disease)   Cerebrovascular accident (CVA)   Diabetes   HTN (hypertension)   Seizures   , per nurse's notes.      Physical Examination               Vital Signs   Vital Signs   35/36/1443 15:40 EDT Systolic Blood Pressure 086 mmHg    Diastolic Blood Pressure 64 mmHg    Peripheral Pulse Rate 86 bpm    Heart Rate Monitored 90 bpm    Respiratory Rate 17 br/min    Mean Arterial Pressure, Cuff 83 mmHg    SpO2 92 %   76/19/5093 26:71 EDT Systolic Blood Pressure 245 mmHg    Diastolic Blood Pressure 82 mmHg    Peripheral Pulse Rate 85 bpm    Heart Rate Monitored 85 bpm    Respiratory Rate 19 br/min    Mean Arterial Pressure, Cuff 98 mmHg    SpO2 97 %   80/99/8338 25:05 EDT Systolic Blood Pressure 397 mmHg    Diastolic Blood Pressure 78 mmHg    Peripheral Pulse Rate 83 bpm    Heart Rate Monitored 83 bpm    Respiratory Rate 16 br/min    Mean Arterial Pressure, Cuff 95 mmHg    SpO2 96 %   67/34/1937 90:24 EDT Systolic Blood Pressure 097 mmHg    Diastolic Blood Pressure 82 mmHg    Peripheral Pulse Rate 93 bpm    Heart Rate Monitored 92 bpm    Respiratory Rate 15 br/min    Mean Arterial Pressure, Cuff 95 mmHg    SpO2 97 %   35/32/9924 26:83 EDT Systolic Blood Pressure 419 mmHg  HI    Diastolic Blood Pressure 87 mmHg    Peripheral Pulse Rate 90 bpm    Heart Rate Monitored 97 bpm    Respiratory Rate 17 br/min    Mean Arterial Pressure, Cuff 102 mmHg  HI    SpO2 98 %    62/22/9798 92:11 EDT Systolic Blood Pressure 941 mmHg    Diastolic Blood Pressure 89 mmHg    Peripheral Pulse Rate 100 bpm    Heart Rate Monitored 97 bpm    Respiratory Rate 13 br/min    Mean Arterial Pressure, Cuff 106 mmHg  HI    SpO2 98 %   74/11/1446 18:56 EDT Systolic Blood Pressure 314 mmHg    Diastolic Blood Pressure 88 mmHg    Temperature Oral 36.3 degC    Heart Rate Monitored 95 bpm    Respiratory Rate 19 br/min    SpO2 95 %   .   Measurements   01/30/2019 18:20 EDT Body Mass Index est meas 35.80 kg/m2   01/30/2019 18:20 EDT Body Mass Index Measured 35.80 kg/m2   01/30/2019  18:15 EDT Height/Length Measured 175.3 cm    Weight Dosing 110 kg   .   Basic Oxygen Information   01/30/2019 22:00 EDT SpO2 92 %    Oxygen Therapy Room air   01/30/2019 21:30 EDT SpO2 97 %    Oxygen Therapy Room air   01/30/2019 21:00 EDT SpO2 96 %    Oxygen Therapy Room air   01/30/2019 20:30 EDT SpO2 97 %    Oxygen Therapy Room air   01/30/2019 20:16 EDT SpO2 98 %    Oxygen Therapy Room air   01/30/2019 19:47 EDT SpO2 98 %    Oxygen Therapy Room air   01/30/2019 18:15 EDT SpO2 95 %    Oxygen Therapy Room air   .   General:  Alert, no acute distress, unkempt.    Skin:  Warm, dry, intact.    Head:  Normocephalic, atraumatic.    Neck:  Supple, trachea midline.    Eye:  Extraocular movements are intact, normal conjunctiva.    Cardiovascular:  Regular rate and rhythm, Normal peripheral perfusion.    Respiratory:  Lungs are clear to auscultation, respirations are non-labored.    Chest wall:  No tenderness.   Back:  Normal range of motion.   Musculoskeletal:  Normal ROM, no deformity.    Gastrointestinal:  Soft, Nontender, Non distended.    Neurological:  Alert and oriented to person, place, time, and situation, No focal neurological deficit observed.       Medical Decision Making   Rationale:  Patient was found to have no significant abnormalities on his work-up in the ED.  EKG is nonischemic.  Negative troponin.  The patient was given  this information and said he was still not feeling well.  I offered the patient a COVID test prior to discharge and he accepted.    When the nurse went to try to discharge the patient he began telling her he was having suicidal thoughts.  I was able to go back and talk to the patient and he states that he has been having suicidal thoughts for the past few days but they worsened after he went to the bathroom here in the ED.  His plan would be to overdose or walk into traffic.  He admits to prior suicide attempts with both methods.    The patient is homeless and the timing of his suicidal thoughts just prior to discharge seems to be more consistent with malingering.  The patient will be held overnight in the ED, but if at any point he states that he is not suicidal and would like to leave I feel he is safe to do so.  The plan will be for the patient to be evaluated by case management and psychiatry in the morning..   Documents reviewed:  Emergency department nurses' notes.   Electrocardiogram:  Emergency Provider interpretation performed by me, See ECG ED Review.    Results review:  Lab results : Lab View   01/30/2019 21:11 EDT SARS Cov2 Ag FIA Negative   01/30/2019 20:25 EDT Appear U POC Clear    Color U POC Yellow    Bili U POC Negative    Blood U POC Negative    Glucose U POC Negative mg/dL    Ketones U POC Negative mg/dL    Leuk Est U POC Negative    Nitrite U POC Negative    pH U POC 6.0    Protein U POC Negative    Spec Grav U  POC >=1.030    Urobilin U POC 0.2 EU/dL   01/30/2019 19:40 EDT Estimated Creatinine Clearance 112.85 mL/min   01/30/2019 19:02 EDT WBC 5.0 x10e3/mcL    RBC 4.76 x10e6/mcL    Hgb 13.3 g/dL    HCT 42.0 %    MCV 88.2 fL    MCH 27.9 pg    MCHC 31.7 g/dL  LOW    RDW 14.6 %    Platelet 245 x10e3/mcL    MPV 8.9 fL    Neutro Auto 56.5 %    Neutro Absolute 2.8 x10e3/mcL    Immature Grans Percent 0.4 %    Immature Grans Absolute 0.02 x10e3/mcL    Lymph Auto 29.7 %    Lymph Absolute 1.5 x10e3/mcL     Mono Auto 11.8 %    Mono Absolute 0.6 x10e3/mcL    Eosinophil Percent 1.2 %    Eos Absolute 0.1 x10e3/mcL    Basophil Auto 0.4 %    Baso Absolute 0.0 x10e3/mcL    NRBC Absolute Auto 0.000 x10e3/mcL    NRBC Percent Auto 0.0 %    Sodium Lvl 137 mmol/L    Potassium Lvl 4.1 mmol/L    Chloride 101 mmol/L    CO2 23 mmol/L    Glucose Random 118 mg/dL  HI    BUN 12 mg/dL    Creatinine Lvl 1.0 mg/dL    AGAP 13 mmol/L    Osmolality Calc 274 mOsm/kg    Calcium Lvl 8.8 mg/dL    Protein Total 6.6 g/dL    Albumin Lvl 4.1 g/dL    Globulin Calc 2.5 g/dL    AG Ratio Calc 1.64 mmol/L    Alk Phos 70 unit/L    AST 50 unit/L  HI    ALT 48 unit/L  HI    eGFR AA 104 mL/min/1.53m   eGFR Non-AA 90 mL/min/1.745m  Bili Total 0.30 mg/dL    Trop T Quant <0.010 ng/mL   .   Chest X-Ray:  FINAL REPORT  CHEST:   AP (or PA),  01/30/2019 7:35 PM    INDICATION: Chest pain      COMPARISON: None    FINDINGS: Cardiac silhouette is not enlarged. Pulmonary vasculature is not  increased. No pulmonary edema or pneumonia is seen. No pleural fluid is  identified.      IMPRESSION:    No acute process identified.    Signature Line  ***** Final *****      Releasing Radiologist:   MEBrandon MelnickReleased Date and Time:  01/30/19 19:41.      Reexamination/ Reevaluation   Time: 01/30/2019 23:00:00 .   Notes: Patient voicing suicidal thoughts.  We will plan to move rooms and placed on suicide precautions..   Time: 01/30/2019 23:45:00 .   Notes: Patient will be placed on a security watch and signed out to the overnight attending..      Impression and Plan   Diagnosis   Nonspecific chest pain (ICD10-CM R07.9, Discharge, Medical)   Suicidal ideation (ICD10-CM R45.851, Discharge, Medical)   Homelessness (ICD10-CM Z59.0, Discharge, Medical)   Diabetes (ICD10-CM E11.9, Discharge, Medical)   Plan   Condition: Stable.    Disposition: Patient care transitioned to: Time: 01/30/2019 23:47:00, MANDEL-DO,  ADAM HOWARD.    Counseled: Patient, Regarding diagnosis,  Regarding diagnostic results, Regarding treatment plan, Patient indicated understanding of instructions.

## 2019-01-31 NOTE — ED Provider Notes (Signed)
Addendum *ED        Patient:   Dean Rogers, Dean Rogers            MRN: [id number]            FIN: [id number]               Age:   48 years     Sex:  Male     DOB:  [birth year]   Associated Diagnoses:   None   Author:   Scherrie Bateman,  Thurston Brendlinger HOWARD      Medical Decision Making   This patient was signed out to me change pending the completion of a case management evaluation.  Patient did not require any interventions throughout my shift.  He has remained comfortable.  The patient will be signed out at change of shift in the morning pending case management and psychiatric evaluation.      Reexamination/ Reevaluation   Vital signs   Basic Oxygen Information   01/31/2019 3:56 EDT SpO2 95 %    Oxygen Therapy Room air   01/30/2019 23:00 EDT SpO2 94 %    Oxygen Therapy Room air   01/30/2019 22:00 EDT SpO2 92 %    Oxygen Therapy Room air   01/30/2019 21:30 EDT SpO2 97 %    Oxygen Therapy Room air   01/30/2019 21:00 EDT SpO2 96 %    Oxygen Therapy Room air   01/30/2019 20:30 EDT SpO2 97 %    Oxygen Therapy Room air   01/30/2019 20:16 EDT SpO2 98 %    Oxygen Therapy Room air   01/30/2019 19:47 EDT SpO2 98 %    Oxygen Therapy Room air   01/30/2019 18:15 EDT SpO2 95 %    Oxygen Therapy Room air      Signature Line     Electronically Signed on 01/31/2019 06:27 AM EDT   ________________________________________________   Scherrie Bateman,  Lido Maske HOWARD

## 2019-02-01 LAB — CBC WITH AUTO DIFFERENTIAL
Absolute Baso #: 0 10*3/uL (ref 0.0–0.2)
Absolute Eos #: 0 10*3/uL (ref 0.0–0.5)
Absolute Lymph #: 1.7 10*3/uL (ref 1.0–3.2)
Absolute Mono #: 0.7 10*3/uL (ref 0.3–1.0)
Basophils %: 0.3 % (ref 0.0–2.0)
Eosinophils %: 0.5 % (ref 0.0–7.0)
Hematocrit: 43 % (ref 38.0–52.0)
Hemoglobin: 13.5 g/dL (ref 13.0–17.3)
Immature Grans (Abs): 0.04 10*3/uL (ref 0.00–0.06)
Immature Granulocytes: 0.7 % — ABNORMAL HIGH (ref 0.1–0.6)
Lymphocytes: 27.6 % (ref 15.0–45.0)
MCH: 27.4 pg (ref 27.0–34.5)
MCHC: 31.4 g/dL — ABNORMAL LOW (ref 32.0–36.0)
MCV: 87.4 fL (ref 84.0–100.0)
MPV: 9.1 fL (ref 7.2–13.2)
Monocytes: 11.6 % (ref 4.0–12.0)
NRBC Absolute: 0 10*3/uL (ref 0.000–0.012)
NRBC Automated: 0 % (ref 0.0–0.2)
Neutrophils %: 59.3 % (ref 42.0–74.0)
Neutrophils Absolute: 3.6 10*3/uL (ref 1.6–7.3)
Platelets: 254 10*3/uL (ref 140–440)
RBC: 4.92 x10e6/mcL (ref 4.00–5.60)
RDW: 14.5 % (ref 11.0–16.0)
WBC: 6 10*3/uL (ref 3.8–10.6)

## 2019-02-01 LAB — BASIC METABOLIC PANEL
Anion Gap: 11 mmol/L (ref 2–17)
BUN: 9 mg/dL (ref 6–20)
CO2: 24 mmol/L (ref 22–29)
Calcium: 9 mg/dL (ref 8.6–10.0)
Chloride: 101 mmol/L (ref 98–107)
Creatinine: 1 mg/dL (ref 0.7–1.3)
GFR African American: 104 mL/min/{1.73_m2} (ref >=90)
GFR Non-African American: 90 mL/min/{1.73_m2} (ref >=90)
Glucose: 115 mg/dL — ABNORMAL HIGH (ref 70–99)
OSMOLALITY CALCULATED: 271 mOsm/kg (ref 270–287)
Potassium: 4.3 mmol/L (ref 3.5–5.3)
Sodium: 136 mmol/L (ref 135–145)

## 2019-02-01 LAB — TROPONIN T: Troponin T: <0.01 ng/mL (ref 0.000–0.010)

## 2019-02-01 LAB — POCT GLUCOSE: POC Glucose: 104 mg/dL (ref 70.0–120.0)

## 2019-02-01 NOTE — ED Provider Notes (Signed)
Seizure *ED        Patient:   Dean Rogers, Dean Rogers            MRN: [id number]            FIN: [id number]               Age:   48 years     Sex:  Male     DOB:  1970-11-14   Associated Diagnoses:   Seizure; History of seizure disorder; Non compliance w medication regimen; Chest discomfort   Author:   Jacky Kindle      Basic Information   Time seen: Provider Seen (ST)   ED Provider/Time:    Hanley Hays LANE / 02/01/2019 13:39  .   Additional information: Chief Complaint from Nursing Triage Note   Chief Complaint  Chief Complaint: pt reported to have a seizure. he flagged down someone to call EMS, not post-ictal. c/o some CP since the seizure (02/01/19 13:27:00).      History of Present Illness   Patient is a 48 year old male who presents to the emergency department via EMS for possible seizure.  He does have a history of seizure disorder from the time he was a child and is supposed to be taking Depakote, but states he has been off of it for several months.  He states that he is currently homeless and recently moved to [place].  He notes that since the reported tonic-clonic seizure activity just prior to arrival, he is feeling back to his baseline.  He has had some substernal chest discomfort recently, and has been seen and evaluated at [facility] emergency department and [facility] emergency department within the past 1 week.  He states he had a stress test done at [facility] and was told everything looked fine.  He denies any head injury, neck or back pain, headache, dizziness, blurred vision, focal weakness, numbness, or tingling in his extremities, shortness of breath or palpitations, abdominal pain, nausea/vomiting.  He states he has a history of polysubstance and alcohol abuse, but has been clean for the past 1 year and denies any stimulant use or other exacerbating factors for his seizures aside from medication noncompliance.  He was given aspirin via EMS with complaint of chest pain..         Review of Systems   Eye symptoms:  No blurred vision,    Respiratory symptoms:  No shortness of breath,    Cardiovascular symptoms:  Chest pain, no palpitations, no syncope, no diaphoresis, no peripheral edema.    Gastrointestinal symptoms:  No abdominal pain, no nausea, no vomiting.    Musculoskeletal symptoms:  No back pain,    Neurologic symptoms:  Seizure, no headache, no dizziness, no altered level of consciousness, no numbness, no tingling, no focal weakness.    Psychiatric symptoms:  No substance abuse,       Health Status   Allergies:    Allergic Reactions (All)  Moderate  Ceftin- Hives.  Penicillin- Hives.  Reglan- Hives.  Toradol- Hives..   Medications:  (Selected)   Inpatient Medications  Ordered  Depakote: 500 mg, 1 tabs, Oral, Once  Prescriptions  Prescribed  Remeron 15 mg oral tablet: 15 mg, 1 tabs, Oral, Once a Day (at bedtime), for 7 days, 7 tabs, 0 Refill(s)  Vistaril 25 mg oral capsule: 25 mg, 1 caps, Oral, BID, for 7 days, PRN: as needed for anxiety, 14 caps, 0 Refill(s)  gabapentin 300 mg oral capsule: 300  mg, 1 caps, Oral, TID, for 7 days, 21 caps, 0 Refill(s)  Documented Medications  Documented  Depakote: Oral, TID, 0 Refill(s)  Effexor: Oral, 0 Refill(s)  Plavix: Oral, 0 Refill(s)  Remeron: Oral, Once a Day (at bedtime), 0 Refill(s)  Vistaril: Oral, QID, 0 Refill(s)  amLODIPine: 10 mg, Oral, Daily, 0 Refill(s)  busPIRone: Oral, BID, 0 Refill(s).      Past Medical/ Family/ Social History   Medical history: Reviewed as documented in chart.   Surgical history: Reviewed as documented in chart.   Family history: Not significant.   Social history: Reviewed as documented in chart.   Problem list:    Active Problems (5)  CAD (coronary artery disease)   Cerebrovascular accident (CVA)   Diabetes   HTN (hypertension)   Seizures   , per nurse's notes.      Physical Examination               Vital Signs   Vital Signs   10/62/6948 54:62 EDT Systolic Blood Pressure 703 mmHg  HI    Diastolic Blood Pressure  99 mmHg  HI    Heart Rate Monitored 89 bpm    Respiratory Rate 15 br/min    Mean Arterial Pressure, Cuff 113 mmHg  HI    SpO2 98 %   50/12/3816 29:93 EDT Systolic Blood Pressure 716 mmHg  HI    Diastolic Blood Pressure 99 mmHg  HI    Temperature Oral 37.1 degC    Heart Rate Monitored 86 bpm    Respiratory Rate 16 br/min    SpO2 96 %   96/78/9381 0:17 EDT Systolic Blood Pressure 510 mmHg    Diastolic Blood Pressure 66 mmHg    Heart Rate Monitored 70 bpm    Respiratory Rate 20 br/min    SpO2 94 %   25/85/2778 2:42 EDT Systolic Blood Pressure 353 mmHg    Diastolic Blood Pressure 68 mmHg    Heart Rate Monitored 80 bpm    Respiratory Rate 17 br/min    SpO2 95 %   .   Measurements   02/01/2019 13:44 EDT Body Mass Index est meas 32.33 kg/m2    Body Mass Index Measured 32.33 kg/m2   02/01/2019 13:27 EDT Height/Length Measured 175 cm    Weight Dosing 99 kg   .   Basic Oxygen Information   02/01/2019 13:47 EDT SpO2 98 %    Oxygen Therapy Room air   02/01/2019 13:27 EDT SpO2 96 %    Oxygen Therapy Room air   01/31/2019 6:34 EDT SpO2 94 %    Oxygen Therapy Room air   01/31/2019 3:56 EDT SpO2 95 %    Oxygen Therapy Room air   .   General:  Alert, no acute distress.    Skin:  Warm, dry.    Head:  Normocephalic, atraumatic.    Eye:  Extraocular movements are intact, normal conjunctiva.    Cardiovascular:  Regular rate and rhythm, Normal peripheral perfusion, No edema.    Respiratory:  Lungs are clear to auscultation, respirations are non-labored, breath sounds are equal, Symmetrical chest wall expansion.    Chest wall:  No tenderness.   Gastrointestinal:  Soft, Nontender.    Neurological:  Alert and oriented to person, place, time, and situation, No focal neurological deficit observed, normal sensory observed, normal motor observed, normal speech observed, normal coordination observed.    Psychiatric:  Cooperative, appropriate mood & affect, normal judgment.       Medical Decision Making  Differential Diagnosis:  Grand mal  seizure.   Rationale:  PA/NP reviewed with co-signing physician: ECG, lab results, radiology studies, medication, diagnosis, and plan of care.   Documents reviewed:  Emergency department nurses' notes.   Electrocardiogram:  Emergency Provider interpretation performed by me, rate 80, normal sinus rhythm, No ST-T changes, normal PR & QRS intervals, Unchanged from prior tracing.    Results review:  Lab results : Lab View   02/01/2019 14:43 EDT Estimated Creatinine Clearance 106.90 mL/min   02/01/2019 14:28 EDT Glucose POC 104.0 mg/dL   02/01/2019 14:20 EDT WBC 6.0 x10e3/mcL    RBC 4.92 x10e6/mcL    Hgb 13.5 g/dL    HCT 43.0 %    MCV 87.4 fL    MCH 27.4 pg    MCHC 31.4 g/dL  LOW    RDW 14.5 %    Platelet 254 x10e3/mcL    MPV 9.1 fL    Neutro Auto 59.3 %    Neutro Absolute 3.6 x10e3/mcL    Immature Grans Percent 0.7 %  HI    Immature Grans Absolute 0.04 x10e3/mcL    Lymph Auto 27.6 %    Lymph Absolute 1.7 x10e3/mcL    Mono Auto 11.6 %    Mono Absolute 0.7 x10e3/mcL    Eosinophil Percent 0.5 %    Eos Absolute 0.0 x10e3/mcL    Basophil Auto 0.3 %    Baso Absolute 0.0 x10e3/mcL    NRBC Absolute Auto 0.000 x10e3/mcL    NRBC Percent Auto 0.0 %    Sodium Lvl 136 mmol/L    Potassium Lvl 4.3 mmol/L    Chloride 101 mmol/L    CO2 24 mmol/L    Glucose Random 115 mg/dL  HI    BUN 9 mg/dL    Creatinine Lvl 1.0 mg/dL    AGAP 11 mmol/L    Osmolality Calc 271 mOsm/kg    Calcium Lvl 9.0 mg/dL    eGFR AA 104 mL/min/1.61m   eGFR Non-AA 90 mL/min/1.782m  Trop T Quant <0.010 ng/mL   02/01/2019 13:44 EDT Estimated Creatinine Clearance 106.90 mL/min   01/31/2019 7:14 EDT Glucose POC 96.0 mg/dL   .   Radiology results:  Rad Results (ST)   XR Chest 1 View Portable  ?  02/01/19 14:19:45  Chest AP: 02/01/19    COMPARISON: 01/30/2019    INDICATION: Altered mental status, unspecified,    FINDINGS: No pneumothorax or pleural fluid. No edema or focal opacity.  Cardiomediastinal contours are normal for AP technique. No evident  bony  abnormality.    IMPRESSION:  No evidence for acute cardiopulmonary process.    If you have questions regarding this report, please feel free to call me  directly using [employer].  ?  Signed By: BEJanace ArisJEFFERY  .   Notes:  [age]ear old male with history of seizure disorder who is been noncompliant with his medications for the past month or so.  He also notes that he has had some chest discomfort recently [facility] which [facility] the past week which showed no issues by his report.  He states that he had a seizure outside of a homeless shelter today prompting ER visit.  He has no complaints currently and feels back to his baseline.  He is resting comfortably currently, no acute distress.  Hemodynamically stable and nontoxic in appearance.  He has a completely normal neurologic exam.  Labs including CBC, CMP, troponin  are reviewed.  Chest x-ray is unremarkable.  EKG shows normal sinus rhythm without any evidence of acute ischemia or arrhythmia today.  He is given a dose of his prescribed Depakote and will refill this prescription for him.  Close follow-up with primary care provider.  We reviewed signs and symptoms which to return to the emergency department he voices understanding.  He is stable for discharge home..      Impression and Plan   Diagnosis   Seizure (ICD10-CM R56.9, Discharge, Medical)   History of seizure disorder (ICD10-CM Z86.69, Discharge, Medical)   Non compliance w medication regimen (ICD10-CM Z91.14, Discharge, Medical)   Chest discomfort (ICD10-CM R07.89, Discharge, Medical)   Plan   Condition: Stable.    Disposition: Discharged: Time  02/01/2019 14:56:00, to home.    Prescriptions: [facility] prescriptions   Pharmacy:  Depakote 500 mg oral delayed release tablet (Prescribe): 1,500 mg, 3 tabs, Oral, Daily, 90 tabs, 0 Refill(s).    Patient was given the following educational materials: Seizure, Adult, Chest Pain Observation, [facility] (Custom).    Follow up with: Follow up with primary care provider Within 1 week Please follow-up with primary care provider.  Resume your Depakote.  Rest and stay hydrated.  Return to the ER for any worsening or new concerning symptoms as discussed.    Counseled: Patient, Regarding diagnosis, Regarding diagnostic results, Regarding treatment plan, Regarding prescription, Patient indicated understanding of instructions.    Signature Line     Electronically Signed on 02/01/2019 03:38 PM EDT   ________________________________________________   Roxanna Mew LANE-PA-C, PA-C               Modified by: Roxanna Mew LANE-PA-C, PA-C on 02/01/2019 02:58 PM EDT      Modified by: Roxanna Mew LANE-PA-C, PA-C on 02/01/2019 03:38 PM EDTAddendum by Rozanna Box on February 01, 2019 16:39 EDT               02/01/2019 16:39:12: I reviewed the physician assistant's documentation and the patient chart. I agree with the current treatment plan as prescribed. Please refer to documentation for further details.  Signature Line     Electronically Signed on 02/01/2019 04:39 PM EDT   ________________________________________________   Rozanna Box               Modified by: Rozanna Box on 02/01/2019 04:39 PM EDT

## 2019-02-01 NOTE — ED Notes (Signed)
ED Patient Education Note     ;Patient Education Materials Follows:           [facility]      Follow up at [facility]    [address] [place] [address]    [place]  [address]      Call [phone] for appt/location Mon-Fri 9a-4p. All other times call and leave a message with the answering service. The staff will contact you to set up your appointment.      Cardiovascular     Chest Pain Observation    It is often hard to give a specific diagnosis for the cause of chest pain. Among other possibilities your symptoms might be caused by inadequate oxygen delivery to your heart (angina). Angina that is not treated or evaluated can lead to a heart attack (myocardial infarction) or death.    Blood tests, electrocardiograms, and X-rays may have been done to help determine a possible cause of your chest pain. After evaluation and observation, your health care provider has determined that it is unlikely your pain was caused by an unstable condition that requires hospitalization. However, a full evaluation of your pain may need to be completed, with additional diagnostic testing as directed. It is very important to keep your follow-up appointments. Not keeping your follow-up appointments could result in permanent heart damage, disability, or death. If there is any problem keeping your follow-up appointments, you must call your health care provider.    HOME CARE INSTRUCTIONS    Due to the slight chance that your pain could be angina, it is important to follow your health care provider's treatment plan and also maintain a healthy lifestyle:     Maintain or work toward achieving a healthy weight.     Stay physically active and exercise regularly.     Decrease your salt intake.     Eat a balanced, healthy diet. Talk to a dietitian to learn about heart-healthy foods.     Increase your fiber intake by including whole grains, vegetables,  fruits, and nuts in your diet.     Avoid situations that cause stress, anger, or depression.     Take medicines as advised by your health care provider. Report any side effects to your health care provider. Do not stop medicines or adjust the dosages on your own.     Quit smoking. Do not use nicotine patches or gum until you check with your health care provider.     Keep your blood pressure, blood sugar, and cholesterol levels within normal limits.     Limit alcohol intake to no more than 1 drink per day for women who are not pregnant and 2 drinks per day for men.     Do not abuse drugs.    SEEK IMMEDIATE MEDICAL CARE IF:    You have severe chest pain or pressure which may include symptoms such as:     You feel pain or pressure in your arms, neck, jaw, or back.     You have severe back or abdominal pain, feel sick to your stomach (nauseous), or throw up (vomit).     You are sweating profusely.     You are having a fast or irregular heartbeat.     You feel short of breath while at rest.     You notice increasing shortness of breath during rest, sleep, or with  activity.     You have chest pain that does not get better after rest or after taking your usual medicine.     You wake from sleep with chest pain.     You are unable to sleep because you cannot breathe.     You develop a frequent cough or you are coughing up blood.     You feel dizzy, faint, or experience extreme fatigue.     You develop severe weakness, dizziness, fainting, or chills.     Any of these symptoms may represent a serious problem that is an emergency. Do not wait to see if the symptoms will go away. Call your local emergency services (911 in the [place] Do not drive yourself to the hospital.    MAKE SURE YOU:     Understand these instructions.     Will watch your condition.     Will get help right away if you are not doing well or get worse.    This information is not intended to replace advice given to you by your health care provider. Make sure  you discuss any questions you have with your health care provider.    Document Released: 05/04/2010 Document Revised: 04/06/2013 Document Reviewed: 10/01/2012  Elsevier Interactive Patient Education ?2016 [employer]      Home Health Care     Seizure, Adult    A seizure is abnormal electrical activity in the brain. Seizures usually last from 30 seconds to 2 minutes. There are various types of seizures.      Before a seizure, you may have a warning sensation (aura) that a seizure is about to occur. An aura may include the following symptoms:      Fear or anxiety.     Nausea.     Feeling like the room is spinning (vertigo).     Vision changes, such as seeing flashing lights or spots.    Common symptoms during a seizure include:     A change in attention or behavior (altered mental status).     Convulsions with rhythmic jerking movements.      Drooling.     Rapid eye movements.     Grunting.     Loss of bladder and bowel control.     Bitter taste in the mouth.     Tongue biting.    After a seizure, you may feel confused and sleepy. You may also have an injury resulting from convulsions during the seizure.    HOME CARE INSTRUCTIONS     If you are given medicines, take them exactly as prescribed by your health care provider.     Keep all follow-up appointments as directed by your health care provider.     Do not swim or drive or engage in risky activity during which a seizure could cause further injury to you or others until your health care provider says it is OK.     Get adequate rest.     Teach friends and family what to do if you have a seizure. They should:    ? Lay you on the ground to prevent a fall.    ? Put a cushion under your head.    ? Loosen any tight clothing around your neck.    ? Turn you on your side. If vomiting occurs, this helps keep your airway clear.    ? Stay with you until you recover.    ? Know whether or not you need emergency care.  SEEK IMMEDIATE MEDICAL CARE IF:     The seizure lasts  longer than 5 minutes.     The seizure is severe or you do not wake up immediately after the seizure.     You have an altered mental status after the seizure.     You are having more frequent or worsening seizures.    Someone should drive you to the emergency department or call local emergency services (911 in [place]    MAKE SURE YOU:     Understand these instructions.     Will watch your condition.     Will get help right away if you are not doing well or get worse.    This information is not intended to replace advice given to you by your health care provider. Make sure you discuss any questions you have with your health care provider.    Document Released: 03/29/2000 Document Revised: 04/22/2014 Document Reviewed: 11/11/2012  Elsevier Interactive Patient Education ?2016 [employer]

## 2019-02-01 NOTE — ED Notes (Signed)
ED Patient Education Note     ;Patient Education Materials Follows:           [facility]      Follow up at [facility]    [address] [place] [address]    [place]  [address]      Call [phone] for appt/location Mon-Fri 9a-4p. All other times call and leave a message with the answering service. The staff will contact you to set up your appointment.      Cardiovascular     Chest Pain Observation    It is often hard to give a specific diagnosis for the cause of chest pain. Among other possibilities your symptoms might be caused by inadequate oxygen delivery to your heart (angina). Angina that is not treated or evaluated can lead to a heart attack (myocardial infarction) or death.    Blood tests, electrocardiograms, and X-rays may have been done to help determine a possible cause of your chest pain. After evaluation and observation, your health care provider has determined that it is unlikely your pain was caused by an unstable condition that requires hospitalization. However, a full evaluation of your pain may need to be completed, with additional diagnostic testing as directed. It is very important to keep your follow-up appointments. Not keeping your follow-up appointments could result in permanent heart damage, disability, or death. If there is any problem keeping your follow-up appointments, you must call your health care provider.    HOME CARE INSTRUCTIONS    Due to the slight chance that your pain could be angina, it is important to follow your health care provider's treatment plan and also maintain a healthy lifestyle:     Maintain or work toward achieving a healthy weight.     Stay physically active and exercise regularly.     Decrease your salt intake.     Eat a balanced, healthy diet. Talk to a dietitian to learn about heart-healthy foods.     Increase your fiber intake by including whole grains, vegetables,  fruits, and nuts in your diet.     Avoid situations that cause stress, anger, or depression.     Take medicines as advised by your health care provider. Report any side effects to your health care provider. Do not stop medicines or adjust the dosages on your own.     Quit smoking. Do not use nicotine patches or gum until you check with your health care provider.     Keep your blood pressure, blood sugar, and cholesterol levels within normal limits.     Limit alcohol intake to no more than 1 drink per day for women who are not pregnant and 2 drinks per day for men.     Do not abuse drugs.    SEEK IMMEDIATE MEDICAL CARE IF:    You have severe chest pain or pressure which may include symptoms such as:     You feel pain or pressure in your arms, neck, jaw, or back.     You have severe back or abdominal pain, feel sick to your stomach (nauseous), or throw up (vomit).     You are sweating profusely.     You are having a fast or irregular heartbeat.     You feel short of breath while at rest.     You notice increasing shortness of breath during rest, sleep, or with  activity.     You have chest pain that does not get better after rest or after taking your usual medicine.     You wake from sleep with chest pain.     You are unable to sleep because you cannot breathe.     You develop a frequent cough or you are coughing up blood.     You feel dizzy, faint, or experience extreme fatigue.     You develop severe weakness, dizziness, fainting, or chills.     Any of these symptoms may represent a serious problem that is an emergency. Do not wait to see if the symptoms will go away. Call your local emergency services (911 in the [place] Do not drive yourself to the hospital.    MAKE SURE YOU:     Understand these instructions.     Will watch your condition.     Will get help right away if you are not doing well or get worse.    This information is not intended to replace advice given to you by your health care provider. Make sure  you discuss any questions you have with your health care provider.    Document Released: 05/04/2010 Document Revised: 04/06/2013 Document Reviewed: 10/01/2012  Elsevier Interactive Patient Education ?2016 Elsevier Inc.      Home Health Care     Seizure, Adult    A seizure is abnormal electrical activity in the brain. Seizures usually last from 30 seconds to 2 minutes. There are various types of seizures.      Before a seizure, you may have a warning sensation (aura) that a seizure is about to occur. An aura may include the following symptoms:      Fear or anxiety.     Nausea.     Feeling like the room is spinning (vertigo).     Vision changes, such as seeing flashing lights or spots.    Common symptoms during a seizure include:     A change in attention or behavior (altered mental status).     Convulsions with rhythmic jerking movements.      Drooling.     Rapid eye movements.     Grunting.     Loss of bladder and bowel control.     Bitter taste in the mouth.     Tongue biting.    After a seizure, you may feel confused and sleepy. You may also have an injury resulting from convulsions during the seizure.    HOME CARE INSTRUCTIONS     If you are given medicines, take them exactly as prescribed by your health care provider.     Keep all follow-up appointments as directed by your health care provider.     Do not swim or drive or engage in risky activity during which a seizure could cause further injury to you or others until your health care provider says it is OK.     Get adequate rest.     Teach friends and family what to do if you have a seizure. They should:    ? Lay you on the ground to prevent a fall.    ? Put a cushion under your head.    ? Loosen any tight clothing around your neck.    ? Turn you on your side. If vomiting occurs, this helps keep your airway clear.    ? Stay with you until you recover.    ? Know whether or not you need emergency care.      SEEK IMMEDIATE MEDICAL CARE IF:     The seizure lasts  longer than 5 minutes.     The seizure is severe or you do not wake up immediately after the seizure.     You have an altered mental status after the seizure.     You are having more frequent or worsening seizures.    Someone should drive you to the emergency department or call local emergency services (911 in [place]    MAKE SURE YOU:     Understand these instructions.     Will watch your condition.     Will get help right away if you are not doing well or get worse.    This information is not intended to replace advice given to you by your health care provider. Make sure you discuss any questions you have with your health care provider.    Document Released: 03/29/2000 Document Revised: 04/22/2014 Document Reviewed: 11/11/2012  Elsevier Interactive Patient Education ?2016 Elsevier Inc.

## 2019-02-01 NOTE — ED Notes (Signed)
ED Triage Note       ED Triage Adult Entered On:  02/01/2019 13:29 EDT    Performed On:  02/01/2019 13:27 EDT by Lajean Saver, RN, CASANDRA J               Triage   Temperature Oral :   37.1 degC(Converted to: 98.8 degF)    Heart Rate Monitored :   86 bpm   Respiratory Rate :   16 br/min   Systolic Blood Pressure :   158 mmHg (HI)    Diastolic Blood Pressure :   99 mmHg (HI)    SpO2 :   96 %   Oxygen Therapy :   Room air   Marilynn Latino - 02/01/2019 13:33 EDT   Chief Complaint :   pt reported to have a seizure. he flagged down someone to call EMS, not post-ictal. c/o some CP since the seizure   Numeric Rating Pain Scale :   10 = Worst possible pain   [place] Mode of Arrival :   Ambulance   Infectious Disease Documentation :   Document assessment   Patient presentation :   None of the above   Chief Complaint or Presentation suggest infection :   No   Weight Dosing :   99 kg(Converted to: 218 lb 4 oz)    Height :   175 cm(Converted to: 5 ft 9 in)    Body Mass Index Dosing :   32 kg/m2   Fawn Kirk - 02/01/2019 13:27 EDT   DCP GENERIC CODE   Tracking Group :   ED [employer]   Tracking Acuity :   3   BALLO, RN, Loyal Jacobson - 02/01/2019 13:27 EDT   ED General Section :   Document assessment   Pregnancy Status :   N/A   ED Allergies Section :   Document assessment   ED Reason for Visit Section :   Document assessment   ED Home Meds Section :   Document assessment   ED Quick Assessment :   Patient appears awake, alert, oriented to baseline. Skin warm and dry. Moves all extremities. Respiration even and unlabored. Appears in no apparent distress.   Lajean Saver RNLoyal Jacobson - 02/01/2019 13:27 EDT   PTA/Triage Treatments   ED PTA Pre-Arrival Service :   [facility]   [place], [place], Loyal Jacobson - 02/01/2019 13:27 EDT   ID Risk Screen Symptoms   Recent Travel History :   No recent travel   Close Contact with COVID-19 ID :   No   Last 14 days COVID-19 ID :   Yes - Not Detected (negative)   TB Symptom Screen :    No symptoms   C. diff Symptom/History ID :   Neither of the above   BALLO, RN, CASANDRA J - 02/01/2019 13:27 EDT   Allergies   (As Of: 02/01/2019 13:29:27 EDT)   Allergies (Active)   Ceftin  Estimated Onset Date:   Unspecified ; Reactions:   Hives ; Created By:   Lyndon Code, RN, LAURA L; Reaction Status:   Active ; Category:   Drug ; Substance:   Ceftin ; Type:   Allergy ; Severity:   Moderate ; Updated By:   Lyndon Code RN, Rolin Barry; Reviewed Date:   01/30/2019 22:46 EDT      penicillin  Estimated Onset Date:   Unspecified ; Reactions:   Hives ; Created By:   Lyndon Code, RN, LAURA L;  Reaction Status:   Active ; Category:   Drug ; Substance:   penicillin ; Type:   Allergy ; Severity:   Moderate ; Updated By:   Lyndon Code, RN, Rolin Barry; Reviewed Date:   01/30/2019 22:46 EDT      Reglan  Estimated Onset Date:   Unspecified ; Reactions:   Hives ; Created By:   Lyndon Code, RN, LAURA L; Reaction Status:   Active ; Category:   Drug ; Substance:   Reglan ; Type:   Allergy ; Severity:   Moderate ; Updated By:   Lyndon Code RN, Rolin Barry; Reviewed Date:   01/30/2019 22:46 EDT      Toradol  Estimated Onset Date:   Unspecified ; Reactions:   Hives ; Created By:   Lyndon Code, RN, LAURA L; Reaction Status:   Active ; Category:   Drug ; Substance:   Toradol ; Type:   Allergy ; Severity:   Moderate ; Updated By:   Lyndon Code RN, Rolin Barry; Reviewed Date:   01/30/2019 22:46 EDT        Psycho-Social   Last 3 mo, thoughts killing self/others :   Patient denies   Lajean Saver RNLoyal Jacobson - 02/01/2019 13:27 EDT   ED Home Med List   Medication List   (As Of: 02/01/2019 13:29:27 EDT)   Prescription/Discharge Order    gabapentin  :   gabapentin ; Status:   Prescribed ; Ordered As Mnemonic:   gabapentin 300 mg oral capsule ; Simple Display Line:   300 mg, 1 caps, Oral, TID, for 7 days, 21 caps, 0 Refill(s) ; Ordering Provider:   Soyla Dryer; Catalog Code:   gabapentin ; Order Dt/Tm:   01/31/2019 10:57:04 EDT          hydrOXYzine  :   hydrOXYzine ; Status:    Prescribed ; Ordered As Mnemonic:   Vistaril 25 mg oral capsule ; Simple Display Line:   25 mg, 1 caps, Oral, BID, for 7 days, PRN: as needed for anxiety, 14 caps, 0 Refill(s) ; Ordering Provider:   Soyla Dryer; Catalog Code:   hydrOXYzine ; Order Dt/Tm:   01/31/2019 10:56:56 EDT          mirtazapine  :   mirtazapine ; Status:   Prescribed ; Ordered As Mnemonic:   Remeron 15 mg oral tablet ; Simple Display Line:   15 mg, 1 tabs, Oral, Once a Day (at bedtime), for 7 days, 7 tabs, 0 Refill(s) ; Ordering Provider:   Soyla Dryer; Catalog Code:   mirtazapine ; Order Dt/Tm:   01/31/2019 10:56:45 EDT            Home Meds    amLODIPine  :   amLODIPine ; Status:   Documented ; Ordered As Mnemonic:   amLODIPine ; Simple Display Line:   10 mg, Oral, Daily, 0 Refill(s) ; Catalog Code:   amLODIPine ; Order Dt/Tm:   01/30/2019 19:21:31 EDT          busPIRone  :   busPIRone ; Status:   Documented ; Ordered As Mnemonic:   busPIRone ; Simple Display Line:   Oral, BID, 0 Refill(s) ; Catalog Code:   busPIRone ; Order Dt/Tm:   01/30/2019 19:22:50 EDT          clopidogrel  :   clopidogrel ; Status:   Documented ; Ordered As Mnemonic:   Plavix ; Simple Display Line:   Oral, 0 Refill(s) ; Catalog Code:  clopidogrel ; Order Dt/Tm:   01/30/2019 19:21:46 EDT          divalproex sodium  :   divalproex sodium ; Status:   Documented ; Ordered As Mnemonic:   Depakote ; Simple Display Line:   Oral, TID, 0 Refill(s) ; Catalog Code:   divalproex sodium ; Order Dt/Tm:   01/30/2019 19:21:53 EDT          hydrOXYzine  :   hydrOXYzine ; Status:   Documented ; Ordered As Mnemonic:   Vistaril ; Simple Display Line:   Oral, QID, 0 Refill(s) ; Catalog Code:   hydrOXYzine ; Order Dt/Tm:   01/30/2019 19:22:39 EDT          mirtazapine  :   mirtazapine ; Status:   Documented ; Ordered As Mnemonic:   Remeron ; Simple Display Line:   Oral, Once a Day (at bedtime), 0 Refill(s) ; Catalog Code:   mirtazapine ; Order Dt/Tm:   01/30/2019 19:22:27 EDT           venlafaxine  :   venlafaxine ; Status:   Documented ; Ordered As Mnemonic:   Effexor ; Simple Display Line:   Oral, 0 Refill(s) ; Catalog Code:   venlafaxine ; Order Dt/Tm:   01/30/2019 19:22:45 EDT            ED Reason for Visit   (As Of: 02/01/2019 13:29:27 EDT)   Problems(Active)    CAD (coronary artery disease) (SNOMED CT  :7829562189331010 )  Name of Problem:   CAD (coronary artery disease) ; Recorder:   CERTEZA, RN, LAURA L; Confirmation:   Confirmed ; Classification:   Patient Stated ; Code:   [id number] ; Contributor System:   DietitianowerChart ; Last Updated:   01/30/2019 18:19 EDT ; Life Cycle Date:   01/30/2019 ; Life Cycle Status:   Active ; Vocabulary:   SNOMED CT        Cerebrovascular accident (CVA) (SNOMED CT  :696295284345637012 )  Name of Problem:   Cerebrovascular accident (CVA) ; Recorder:   CERTEZA, RN, LAURA L; Confirmation:   Confirmed ; Classification:   Patient Stated ; Code:   [id number] ; Contributor System:   DietitianowerChart ; Last Updated:   01/30/2019 18:19 EDT ; Life Cycle Date:   01/30/2019 ; Life Cycle Status:   Active ; Vocabulary:   SNOMED CT        Diabetes (SNOMED CT  :725366440121589010 )  Name of Problem:   Diabetes ; Recorder:   CERTEZA, RN, LAURA L; Confirmation:   Confirmed ; Classification:   Patient Stated ; Code:   [id number] ; Contributor System:   PowerChart ; Last Updated:   01/30/2019 18:19 EDT ; Life Cycle Date:   01/30/2019 ; Life Cycle Status:   Active ; Vocabulary:   SNOMED CT        HTN (hypertension) (SNOMED CT  :3875643329308 445 1630 )  Name of Problem:   HTN (hypertension) ; Recorder:   CERTEZA, RN, LAURA L; Confirmation:   Confirmed ; Classification:   Patient Stated ; Code:   [id number] 445 1630 ; Contributor System:   DietitianowerChart ; Last Updated:   01/30/2019 18:18 EDT ; Life Cycle Date:   01/30/2019 ; Life Cycle Status:   Active ; Vocabulary:   SNOMED CT        Seizures (SNOMED CT  :301601093151074011 )  Name of Problem:   Seizures ; Recorder:   CERTEZA, RN, LAURA L; Confirmation:   Confirmed ; Classification:   Patient  Stated ;  Code:   [id number] ; Contributor System:   Conservation officer, nature ; Last Updated:   01/30/2019 18:19 EDT ; Life Cycle Date:   01/30/2019 ; Life Cycle Status:   Active ; Vocabulary:   SNOMED CT          Diagnoses(Active)    Seizure  Date:   02/01/2019 ; Diagnosis Type:   Reason For Visit ; Confirmation:   Complaint of ; Clinical Dx:   Seizure ; Classification:   Medical ; Clinical Service:   Non-Specified ; Code:   PNED ; Probability:   0 ; Diagnosis Code:   [id number]

## 2019-02-01 NOTE — Discharge Summary (Signed)
ED Clinical Summary                        [facility] [place] [address]  [phone]           PERSON INFORMATION  Name: Dean Rogers, Dean Rogers Age:  48 Years DOB: 13-Feb-1971   Sex: Male Language: English PCP: PCP,  NONE   Marital Status: Single Phone: [phone] Med Service: MED-Medicine   MRN: [id number] Acct# [id number] Arrival: 02/01/2019 13:26:00   Visit Reason: Seizure; SEIZURE Acuity: 3 LOS: 000 01:44   Address:    [address] [address]   Diagnosis:    Chest discomfort; History of seizure disorder; Non compliance w medication regimen; Seizure  Medications:          Medications That Were Updated - Follow Current Instructions  Printed Prescriptions  Current divalproex sodium (Depakote 500 mg oral delayed release tablet) 3 Tabs Oral (given by mouth) every day. Refills: 0., DO NOT CRUSH      SOUND ALIKE / LOOK ALIKE - VERIFY DRUG  Last Dose:____________________    Other Medications  Current divalproex sodium (Depakote) Oral (given by mouth) 3 times a day.  Last Dose:____________________    Medications that have not changed  Other Medications  amLODIPine 10 Milligram Oral (given by mouth) every day.  Last Dose:____________________  busPIRone Oral (given by mouth) 2 times a day.  Last Dose:____________________  clopidogrel (Plavix) Oral (given by mouth).  Last Dose:____________________  gabapentin (gabapentin 300 mg oral capsule) 1 Capsules Oral (given by mouth) 3 times a day for 7 Days. Refills: 0.  Last Dose:____________________  hydrOXYzine (Vistaril 25 mg oral capsule) 1 Capsules Oral (given by mouth) 2 times a day as needed as needed for anxiety for 7 Days. Refills: 0.  Last Dose:____________________  hydrOXYzine (Vistaril) Oral (given by mouth) 4 times a day.  Last Dose:____________________  mirtazapine (Remeron 15 mg oral tablet) 1 Tabs Oral (given by mouth) Once a Day (at bedtime) for 7 Days. Refills: 0.  Last Dose:____________________  mirtazapine (Remeron) Oral  (given by mouth) Once a Day (at bedtime).  Last Dose:____________________  venlafaxine (Effexor) Oral (given by mouth).  Last Dose:____________________      Medications Administered During Visit:                Medication Dose Route   divalproex sodium 1500 mg Oral               Allergies      Ceftin (Hives)      Reglan (Hives)      penicillin (Hives)      Toradol (Hives)      Major Tests and Procedures:  The following procedures and tests were performed during your ED visit.  COMMON PROCEDURES%>  COMMON PROCEDURES COMMENTS%>                PROVIDER INFORMATION               Provider Role Assigned Mariane Duval ED Provider 02/01/2019 13:36:49 02/01/2019 13:39:17   Betti Cruz, RN, Lollie Marrow ED Nurse 02/01/2019 13:37:16    Macario Carls, Alvina Filbert ED MidLevel 02/01/2019 13:39:14        Attending Physician:  Hanley Hays LANE      Admit Doc  Macario Carls,  Kiana INFORMATION  Vital Sign Triage Latest  Temp Oral ORAL_1%> ORAL%>   Temp Temporal TEMPORAL_1%> TEMPORAL%>   Temp Intravascular INTRAVASCULAR_1%> INTRAVASCULAR%>   Temp Axillary AXILLARY_1%> AXILLARY%>   Temp Rectal RECTAL_1%> RECTAL%>   02 Sat 96 % 98 %   Respiratory Rate RATE_1%> RATE%>   Peripheral Pulse Rate PULSE RATE_1%> PULSE RATE%>   Apical Heart Rate HEART RATE_1%> HEART RATE%>   Blood Pressure BLOOD PRESSURE_1%>/ BLOOD PRESSURE_1%>99 mmHg BLOOD PRESSURE%> / BLOOD PRESSURE%>89 mmHg                 Immunizations      No Immunizations Documented This Visit          DISCHARGE INFORMATION   Discharge Disposition: H Outpt-Sent Home   Discharge Location:  Home   Discharge Date and Time:  02/01/2019 15:10:00   ED Checkout Date and Time:  02/01/2019 15:10:00     DEPART REASON INCOMPLETE INFORMATION               Depart Action Incomplete Reason   Interactive View/I&O Recently assessed               Problems      No Problems Documented              Smoking Status      Never (less than 100 in lifetime)          PATIENT EDUCATION INFORMATION  Instructions:     [facility] ([facility]); Chest Pain Observation; Seizure, Adult     Follow up:                   With: Address: When:   Follow up with primary care provider  Within 1 week   Comments:   Please follow-up with primary care provider.   Resume your Depakote.   Rest and stay hydrated.   Return to the ER for any worsening or new concerning symptoms as discussed              ED PROVIDER DOCUMENTATION

## 2019-02-01 NOTE — ED Notes (Signed)
ED Triage Note       ED Secondary Triage Entered On:  02/01/2019 13:45 EDT    Performed On:  02/01/2019 13:45 EDT by Betti Cruz, RN, Pryor Ochoa Information   Barriers to Learning :   None evident   ED Home Meds Section :   Document assessment   [facility] ED Fall Risk Section :   Document assessment   ED History Section :   Document assessment   ED Advance Directives Section :   Document assessment   ED Palliative Screen :   N/A (prefilled for <65yo)   Betti Cruz RNLollie Marrow - 02/01/2019 13:45 EDT   (As Of: 02/01/2019 13:45:50 EDT)   Problems(Active)    CAD (coronary artery disease) (SNOMED CT  :16109604 )  Name of Problem:   CAD (coronary artery disease) ; Recorder:   CERTEZA, RN, LAURA L; Confirmation:   Confirmed ; Classification:   Patient Stated ; Code:   [id number] ; Contributor System:   Conservation officer, nature ; Last Updated:   01/30/2019 18:19 EDT ; Life Cycle Date:   01/30/2019 ; Life Cycle Status:   Active ; Vocabulary:   SNOMED CT        Cerebrovascular accident (CVA) (SNOMED CT  :147829562 )  Name of Problem:   Cerebrovascular accident (CVA) ; Recorder:   CERTEZA, RN, LAURA L; Confirmation:   Confirmed ; Classification:   Patient Stated ; Code:   [id number] ; Contributor System:   Conservation officer, nature ; Last Updated:   01/30/2019 18:19 EDT ; Life Cycle Date:   01/30/2019 ; Life Cycle Status:   Active ; Vocabulary:   SNOMED CT        Diabetes (SNOMED CT  :696295284 )  Name of Problem:   Diabetes ; Recorder:   CERTEZA, RN, LAURA L; Confirmation:   Confirmed ; Classification:   Patient Stated ; Code:   [id number] ; Contributor System:   PowerChart ; Last Updated:   01/30/2019 18:19 EDT ; Life Cycle Date:   01/30/2019 ; Life Cycle Status:   Active ; Vocabulary:   SNOMED CT        HTN (hypertension) (SNOMED CT  :7253664403 )  Name of Problem:   HTN (hypertension) ; Recorder:   CERTEZA, RN, LAURA L; Confirmation:   Confirmed ; Classification:   Patient Stated ; Code:   [id number] ; Contributor System:   Conservation officer, nature ; Last  Updated:   01/30/2019 18:18 EDT ; Life Cycle Date:   01/30/2019 ; Life Cycle Status:   Active ; Vocabulary:   SNOMED CT        Seizures (SNOMED CT  :756433295 )  Name of Problem:   Seizures ; Recorder:   CERTEZA, RN, LAURA L; Confirmation:   Confirmed ; Classification:   Patient Stated ; Code:   [id number] ; Contributor System:   Conservation officer, nature ; Last Updated:   01/30/2019 18:19 EDT ; Life Cycle Date:   01/30/2019 ; Life Cycle Status:   Active ; Vocabulary:   SNOMED CT          Diagnoses(Active)    Seizure  Date:   02/01/2019 ; Diagnosis Type:   Reason For Visit ; Confirmation:   Complaint of ; Clinical Dx:   Seizure ; Classification:   Medical ; Clinical Service:   Non-Specified ; Code:   PNED ; Probability:   0 ; Diagnosis Code:   [id number]             -  Procedure History   (As Of: 02/01/2019 13:45:50 EDT)     [facility] Perch Fall Risk Assessment Tool   Hx of falling last 3 months ED Fall :   No   Patient confused or disoriented ED Fall :   No   Patient intoxicated or sedated ED Fall :   No   Patient impaired gait ED Fall :   No   Use a mobility assistance device ED Fall :   No   Patient altered elimination ED Fall :   No   [facility] ED Fall Score :   0    Wylene Men - 02/01/2019 13:45 EDT   ED Advance Directive   Advance Directive :   No   Pryor Montes RNVance Gather - 02/01/2019 13:45 EDT   Social History   Social History   (As Of: 02/01/2019 13:45:50 EDT)   Tobacco:        Tobacco use: Never (less than 100 in lifetime).   (Last Updated: 02/01/2019 13:45:25 EDT by Pryor Montes RN, Vance Gather)          Alcohol:        Denies   (Last Updated: 02/01/2019 13:45:33 EDT by Pryor Montes RN, Vance Gather)          Substance Use:        Denies   (Last Updated: 02/01/2019 13:45:39 EDT by Pryor Montes RN, Vance Gather)

## 2019-02-01 NOTE — ED Notes (Signed)
ED Patient Summary       ;       [facility] Emergency Department  [address] [place], [place] [address]  [phone]  Discharge Instructions (Patient)  _______________________________________     Name: Dean Rogers, Dean Rogers  DOB:  [birth year]                   MRN: [id number]                   FIN: [id number]  Reason For Visit: Seizure; SEIZURE  Final Diagnosis: Chest discomfort; History of seizure disorder; Non compliance w medication regimen; Seizure     Visit Date: 02/01/2019 13:26:00  Address: [place] [address]  Phone: [phone]     Emergency Department Providers:         Primary Physician:            [facility] would like to thank you for allowing [place] to assist you with your healthcare needs. The following includes patient education materials and information regarding your injury/illness.     Follow-up Instructions:  You were seen today on an emergency basis. Please contact your primary care doctor for a follow up appointment. If you received a referral to a specialist doctor, it is important you follow-up as instructed.    It is important that you call your follow-up doctor to schedule and confirm the location of your next appointment. Your doctor may practice at multiple locations. The office location of your follow-up appointment may be different to the one written on your discharge instructions.    If you do not have a primary care doctor, please call [phone] for help in finding a [facility] Provider. For help in finding a specialist doctor, please call [phone].    The [facility] "Ask a Nurse" line in staffed by Registered Nurses and is a free service to the community. We are available Monday - Friday from 8am to 5pm to answer your questions about your health. Please call [phone].    If your condition gets worse before your follow-up with your primary care doctor or specialist, please return to the Emergency  Department.        Follow Up Appointments:  Primary Care Provider:      Name: PCP,  NONE      Phone:                  With: Address: When:   Follow up with primary care provider  Within 1 week   Comments:   Please follow-up with primary care provider.   Resume your Depakote.   Rest and stay hydrated.   Return to the ER for any worsening or new concerning symptoms as discussed              Printed Prescriptions:    Patient Education Materials:  Discharge Orders          Discharge Patient 02/01/19 14:58:00 EDT         Comment:      [facility] ([facility]); Chest Pain Observation; Seizure, Adult           [facility]      Follow up at [facility]. [facility]    [address]  [address]  [place], [place] [address]    OR    [address]  [place]. [place], [place]  [address]  Call [phone] for appt/location Mon-Fri 9a-4p. All other times call and leave a message with the answering service. The staff will contact you to set up your appointment.       Chest Pain Observation    It is often hard to give a specific diagnosis for the cause of chest pain. Among other possibilities your symptoms might be caused by inadequate oxygen delivery to your heart (angina). Angina that is not treated or evaluated can lead to a heart attack (myocardial infarction) or death.    Blood tests, electrocardiograms, and X-rays may have been done to help determine a possible cause of your chest pain. After evaluation and observation, your health care provider has determined that it is unlikely your pain was caused by an unstable condition that requires hospitalization. However, a full evaluation of your pain may need to be completed, with additional diagnostic testing as directed. It is very important to keep your follow-up appointments. Not keeping your follow-up appointments could result in permanent heart damage, disability, or death. If there is any problem keeping your follow-up appointments, you must  call your health care provider.    HOME CARE INSTRUCTIONS    Due to the slight chance that your pain could be angina, it is important to follow your health care provider's treatment plan and also maintain a healthy lifestyle:     Maintain or work toward achieving a healthy weight.     Stay physically active and exercise regularly.     Decrease your salt intake.     Eat a balanced, healthy diet. Talk to a dietitian to learn about heart-healthy foods.     Increase your fiber intake by including whole grains, vegetables, fruits, and nuts in your diet.     Avoid situations that cause stress, anger, or depression.     Take medicines as advised by your health care provider. Report any side effects to your health care provider. Do not stop medicines or adjust the dosages on your own.     Quit smoking. Do not use nicotine patches or gum until you check with your health care provider.     Keep your blood pressure, blood sugar, and cholesterol levels within normal limits.     Limit alcohol intake to no more than 1 drink per day for women who are not pregnant and 2 drinks per day for men.     Do not abuse drugs.    SEEK IMMEDIATE MEDICAL CARE IF:    You have severe chest pain or pressure which may include symptoms such as:     You feel pain or pressure in your arms, neck, jaw, or back.     You have severe back or abdominal pain, feel sick to your stomach (nauseous), or throw up (vomit).     You are sweating profusely.     You are having a fast or irregular heartbeat.     You feel short of breath while at rest.     You notice increasing shortness of breath during rest, sleep, or with activity.     You have chest pain that does not get better after rest or after taking your usual medicine.     You wake from sleep with chest pain.     You are unable to sleep because you cannot breathe.     You develop a frequent cough or you are coughing up blood.     You feel dizzy, faint, or experience extreme fatigue.  You develop severe  weakness, dizziness, fainting, or chills.     Any of these symptoms may represent a serious problem that is an emergency. Do not wait to see if the symptoms will go away. Call your local emergency services (911 in the [place] Do not drive yourself to the hospital.    MAKE SURE YOU:     Understand these instructions.     Will watch your condition.     Will get help right away if you are not doing well or get worse.    This information is not intended to replace advice given to you by your health care provider. Make sure you discuss any questions you have with your health care provider.    Document Released: 05/04/2010 Document Revised: 04/06/2013 Document Reviewed: 10/01/2012  Elsevier Interactive Patient Education ?2016 [employer].       Seizure, Adult    A seizure is abnormal electrical activity in the brain. Seizures usually last from 30 seconds to 2 minutes. There are various types of seizures.      Before a seizure, you may have a warning sensation (aura) that a seizure is about to occur. An aura may include the following symptoms:      Fear or anxiety.     Nausea.     Feeling like the room is spinning (vertigo).     Vision changes, such as seeing flashing lights or spots.    Common symptoms during a seizure include:     A change in attention or behavior (altered mental status).     Convulsions with rhythmic jerking movements.      Drooling.     Rapid eye movements.     Grunting.     Loss of bladder and bowel control.     Bitter taste in the mouth.     Tongue biting.    After a seizure, you may feel confused and sleepy. You may also have an injury resulting from convulsions during the seizure.    HOME CARE INSTRUCTIONS     If you are given medicines, take them exactly as prescribed by your health care provider.     Keep all follow-up appointments as directed by your health care provider.     Do not swim or drive or engage in risky activity during which a seizure could cause further injury to you or others until  your health care provider says it is OK.     Get adequate rest.     Teach friends and family what to do if you have a seizure. They should:    ? Lay you on the ground to prevent a fall.    ? Put a cushion under your head.    ? Loosen any tight clothing around your neck.    ? Turn you on your side. If vomiting occurs, this helps keep your airway clear.    ? Stay with you until you recover.    ? Know whether or not you need emergency care.    SEEK IMMEDIATE MEDICAL CARE IF:     The seizure lasts longer than 5 minutes.     The seizure is severe or you do not wake up immediately after the seizure.     You have an altered mental status after the seizure.     You are having more frequent or worsening seizures.    Someone should drive you to the emergency department or call local emergency services (911 in [place]  MAKE SURE YOU:     Understand these instructions.     Will watch your condition.     Will get help right away if you are not doing well or get worse.    This information is not intended to replace advice given to you by your health care provider. Make sure you discuss any questions you have with your health care provider.    Document Released: 03/29/2000 Document Revised: 04/22/2014 Document Reviewed: 11/11/2012  Elsevier Interactive Patient Education ?2016 [employer] Inc[employer]         Allergy Info: Toradol; Ceftin; Reglan; penicillin     Medication Information:  [facility] ED Physicians provided you with a complete list of medications post discharge, if you have been instructed to stop taking a medication please ensure you also follow up with this information to your Primary Care Physician.  Unless otherwise noted, patient will continue to take medications as prescribed prior to the Emergency Room visit.  Any specific questions regarding your chronic medications and dosages should be discussed with your physician(s) and pharmacist.          amLODIPine 10 Milligram Oral (given by mouth) every day.  busPIRone Oral  (given by mouth) 2 times a day.  clopidogrel (Plavix) Oral (given by mouth).  divalproex sodium (Depakote 500 mg oral delayed release tablet) 3 Tabs Oral (given by mouth) every day. Refills: 0., DO NOT CRUSH      SOUND ALIKE / LOOK ALIKE - VERIFY DRUG  divalproex sodium (Depakote) Oral (given by mouth) 3 times a day.  gabapentin (gabapentin 300 mg oral capsule) 1 Capsules Oral (given by mouth) 3 times a day for 7 Days. Refills: 0.  hydrOXYzine (Vistaril 25 mg oral capsule) 1 Capsules Oral (given by mouth) 2 times a day as needed as needed for anxiety for 7 Days. Refills: 0.  hydrOXYzine (Vistaril) Oral (given by mouth) 4 times a day.  mirtazapine (Remeron 15 mg oral tablet) 1 Tabs Oral (given by mouth) Once a Day (at bedtime) for 7 Days. Refills: 0.  mirtazapine (Remeron) Oral (given by mouth) Once a Day (at bedtime).  venlafaxine (Effexor) Oral (given by mouth).      Medications Administered During Visit:              Medication Dose Route   divalproex sodium 1500 mg Oral          Major Tests and Procedures:  The following procedures and tests were performed during your ED visit.  COMMON PROCEDURES%>  COMMON PROCEDURES COMMENTS%>          Laboratory Orders  Name Status Details   .Glu POC Completed Blood, RT, RT - Routine, Collected, 02/01/19 14:28:03 EDT, Nurse collect, 02/01/19 14:28:03 [facility], IT1000 POC Login   BMP Completed Blood, Stat, ST - Stat, 02/01/19 13:52:00 EDT, 02/01/19 13:52:00 EDT, Nurse collect, KIMSEY-PA-C,  [address], Print label Y/N   CBCDIFF Completed Blood, Stat, ST - Stat, 02/01/19 13:52:00 EDT, 02/01/19 13:52:00 EDT, Nurse collect, KIMSEY-PA-C,  [address], Print label Y/N   Trop T Completed Blood, Stat, ST - Stat, 02/01/19 13:52:00 EDT, 02/01/19 13:52:00 EDT, Nurse collect, KIMSEY-PA-C,  [address], Print label Y/N   Joselyn Glassman Completed Blood, Stat, ST - Stat, Collected, 02/01/19 14:20:00 EDT BALLCA, 02/01/19 14:20:00 EDT, Nurse collect, Venous Draw, 02/01/19 14:37:00 EDT, RH CP Login,  KIMSEY-PA-C,  [address], Print label Y/N, rh_accession_2, 1.8 mL WNUU/*[id number]*/, Complete   XTube Red Completed Blood, Stat, ST - Stat, Collected, 02/01/19 14:20:00 EDT BALLCA,  02/01/19 14:20:00 EDT, Nurse collect, Venous Draw, 02/01/19 14:38:00 EDT, RH CP Login, KIMSEY-PA-C,  [address], Print label Y/N, rh_accession_2, 4 mL ZOX/*[id number]*/Red/*[id number]*/, Complete               Radiology Orders  Name Status Details   XR Chest 1 View Portable Completed 02/01/19 13:52:00 EDT, STAT 1 hour or less, Reason: Altered mental status, unspecified, Transport Mode: PORTABLE, pp_set_radiology_subspecialty               Patient Care Orders  Name Status Details   COVID-19 Status Ordered 02/01/19 13:26:21 EDT, NOT VALID FOR pharmacy, laboratory, radiology., 02/01/19 13:26:21 EDT, COVID-19 Not Detected   Cardiac/NIBP/Pulse Ox Monitoring Completed 02/01/19 13:52:00 EDT, This message can only be seen by Nursing, it is not visible to Pharmacy, Laboratory, or Radiology., 02/01/19 13:52:00 EDT, 02/01/19 13:52:00 EDT, Once   DC ISO Order/Icons Ordered 02/01/19 13:26:21 EDT, 02/01/19 13:26:21 EDT   Discharge Patient Ordered 02/01/19 14:58:00 EDT   ED 72 hr Return Ordered 02/01/19 13:26:21 EDT, This message can only be seen by Nursing, it is not visible to Pharmacy, Laboratory, or Radiology., 02/01/19 13:26:21 EDT   ED Assessment Adult Completed 02/01/19 13:44:48 EDT, 02/01/19 13:44:48 EDT   ED Only Oxygen Therapy Completed 02/01/19 13:52:00 EDT, 02/01/19 13:52:00 EDT, Titrate to Keep SAT > 92%   ED Secondary Triage Completed 02/01/19 13:44:48 EDT, 02/01/19 13:44:48 EDT   ED Triage Adult Completed 02/01/19 13:26:20 EDT, 02/01/19 13:26:20 EDT   POC-Blood Glucose Monitoring Completed 02/01/19 13:52:00 EDT, Once, 02/01/19 13:52:00 EDT   Place Isolation Order Ordered 02/01/19 13:26:21 EDT, 02/01/19 13:26:21 EDT   Saline Lock Insert Completed 02/01/19 13:52:00 EDT, Once, 02/01/19 13:52:00 EDT   Seizure Precautions Ordered 02/01/19 13:52:00 EDT, Constant  Order, CM Seizure Precautions       ---------------------------------------------------------------------------------------------------------------------  [facility] encourages you to self-enroll in the [facility] Patient Portal.  [facility] Patient Portal will allow you to manage your personal health information securely from your own electronic device now and in the future.  To begin your Patient Portal enrollment process, please visit [email or web address]. Click on "Sign up now" under [facility].  If you find that you need additional assistance on the [facility] Patient Portal or need a copy of your medical records, please call the [facility] Medical Records Office at [phone].  Comment:

## 2019-02-02 LAB — ADD ON LAB TEST

## 2019-06-01 ENCOUNTER — Emergency Department (HOSPITAL_COMMUNITY): Payer: Medicaid Other

## 2019-06-01 ENCOUNTER — Encounter (HOSPITAL_COMMUNITY): Payer: Self-pay | Admitting: Emergency Medicine

## 2019-06-01 ENCOUNTER — Emergency Department (HOSPITAL_COMMUNITY)
Admission: EM | Admit: 2019-06-01 | Discharge: 2019-06-01 | Disposition: A | Discharge status: Home / Self Care | Payer: Medicaid Other | Attending: Emergency Medicine | Admitting: Emergency Medicine

## 2019-06-01 DIAGNOSIS — R109 Unspecified abdominal pain: Secondary | ICD-10-CM | POA: Insufficient documentation

## 2019-06-01 DIAGNOSIS — R079 Chest pain, unspecified: Secondary | ICD-10-CM | POA: Insufficient documentation

## 2019-06-01 DIAGNOSIS — N189 Chronic kidney disease, unspecified: Secondary | ICD-10-CM | POA: Insufficient documentation

## 2019-06-01 DIAGNOSIS — E1122 Type 2 diabetes mellitus with diabetic chronic kidney disease: Secondary | ICD-10-CM | POA: Insufficient documentation

## 2019-06-01 DIAGNOSIS — Z590 Homelessness: Secondary | ICD-10-CM | POA: Insufficient documentation

## 2019-06-01 DIAGNOSIS — G8929 Other chronic pain: Secondary | ICD-10-CM

## 2019-06-01 DIAGNOSIS — I129 Hypertensive chronic kidney disease with stage 1 through stage 4 chronic kidney disease, or unspecified chronic kidney disease: Secondary | ICD-10-CM | POA: Insufficient documentation

## 2019-06-01 DIAGNOSIS — Z79899 Other long term (current) drug therapy: Secondary | ICD-10-CM | POA: Insufficient documentation

## 2019-06-01 DIAGNOSIS — Z7984 Long term (current) use of oral hypoglycemic drugs: Secondary | ICD-10-CM | POA: Insufficient documentation

## 2019-06-01 DIAGNOSIS — I252 Old myocardial infarction: Secondary | ICD-10-CM | POA: Insufficient documentation

## 2019-06-01 LAB — CBC
HCT: 43.8 % (ref 39.0–52.0)
Hemoglobin: 13.8 g/dL (ref 13.0–17.0)
MCH: 27.7 pg (ref 26.0–34.0)
MCHC: 31.5 g/dL (ref 30.0–36.0)
MCV: 87.8 fL (ref 80.0–100.0)
Platelets: 295 10*3/uL (ref 150–400)
RBC: 4.99 MIL/uL (ref 4.22–5.81)
RDW: 14 % (ref 11.5–15.5)
WBC: 6.2 10*3/uL (ref 4.0–10.5)
nRBC: 0 % (ref 0.0–0.2)

## 2019-06-01 LAB — URINALYSIS, ROUTINE W REFLEX MICROSCOPIC
Bilirubin Urine: NEGATIVE
Glucose, UA: NEGATIVE mg/dL
Hgb urine dipstick: NEGATIVE
Ketones, ur: 5 mg/dL — AB
Leukocytes,Ua: NEGATIVE
Nitrite: NEGATIVE
Protein, ur: NEGATIVE mg/dL
Specific Gravity, Urine: 1.032 — ABNORMAL HIGH (ref 1.005–1.030)
pH: 5 (ref 5.0–8.0)

## 2019-06-01 LAB — COMPREHENSIVE METABOLIC PANEL
ALT: 40 U/L (ref 0–44)
AST: 27 U/L (ref 15–41)
Albumin: 3.6 g/dL (ref 3.5–5.0)
Alkaline Phosphatase: 62 U/L (ref 38–126)
Anion gap: 12 (ref 5–15)
BUN: 6 mg/dL (ref 6–20)
CO2: 21 mmol/L — ABNORMAL LOW (ref 22–32)
Calcium: 8.9 mg/dL (ref 8.9–10.3)
Chloride: 107 mmol/L (ref 98–111)
Creatinine, Ser: 1.01 mg/dL (ref 0.61–1.24)
GFR calc Af Amer: >60 mL/min (ref >60)
GFR calc non Af Amer: >60 mL/min (ref >60)
Glucose, Bld: 119 mg/dL — ABNORMAL HIGH (ref 70–99)
Potassium: 3.7 mmol/L (ref 3.5–5.1)
Sodium: 140 mmol/L (ref 135–145)
Total Bilirubin: 0.5 mg/dL (ref 0.3–1.2)
Total Protein: 6.1 g/dL — ABNORMAL LOW (ref 6.5–8.1)

## 2019-06-01 LAB — TROPONIN I (HIGH SENSITIVITY): Troponin I (High Sensitivity): 3 ng/L (ref <18)

## 2019-06-01 LAB — LIPASE, BLOOD: Lipase: 46 U/L (ref 11–51)

## 2019-06-01 MED ORDER — NAPROXEN 250 MG PO TABS: 500.0000 mg | ORAL_TABLET | Freq: Once | ORAL | Status: DC

## 2019-06-01 MED ORDER — SODIUM CHLORIDE 0.9% FLUSH: 3.0000 mL | Freq: Once | INTRAVENOUS | Status: DC

## 2019-06-01 MED ORDER — NAPROXEN 500 MG PO TABS: 500.0000 mg | ORAL_TABLET | Freq: Two times a day (BID) | ORAL | 0 refills | Status: DC

## 2019-06-01 NOTE — Discharge Instructions (Signed)
You may return to the emergency department as needed.  Your testing was all normal including your x-ray and all your blood work, your EKG and your heart attack test.  Tylenol or ibuprofen for pain, naproxen twice daily as needed for pain at home, I have prescribed this medication for you

## 2019-06-01 NOTE — ED Notes (Signed)
[name] are clear through out

## 2019-06-01 NOTE — ED Notes (Signed)
Port chest x-ray being obtained at this time 

## 2019-06-01 NOTE — ED Provider Notes (Signed)
[facility] EMERGENCY DEPARTMENT Provider Note   CSN: [id number] Arrival date & time: 06/01/19  1518     History Chief Complaint  Patient presents with  . Chest Pain  . Abdominal Pain    Barry Daugherty is a 49 y.o. male.  HPI   This patient is a 49 year old male, he is admittedly homeless and according to the medical history and the in-depth medical review which I took of the medical record this patient is in and out of the emergency department for multiple somatic complaints frequently complaining of visiting for chest pain, abdominal pain or psychiatric conditions.  He reports [place] is homeless, [place], 2 days ago he was at another emergency department where he was evaluated very thoroughly for the cause of his chest pain.  It was noted that he had a nonischemic nonobstructive catheterization in 2018 and since then negative CT angiograms and multiple troponin cycling evaluations.  He states he still has chest pain on the left side, there is no radiation, worse with deep breathing, not associated with coughing fever or swelling of the legs.  The patient denies nausea vomiting or diarrhea but does endorse chronic abdominal pain related to "chronic pancreatitis".  He denies any alcohol intake.   Past Medical History:  Diagnosis Date  . Anxiety   . Asthma   . Bipolar 1 disorder (HCC)   . Brain injury (HCC)   . Chronic kidney disease    nephrectomy 1975  . Depression   . Grand mal seizure [facility])    "controlled w/RX" (10/28/2017)  . Headache    "couple/week" (10/28/2017)  . High cholesterol   . Hypertension   . Migraine    "couple/year" (10/28/2017)  . Myocardial infarction (HCC) 04/2017  . Sleep apnea    "suppose to have CPAP; I don't have one" (10/28/2017)  . Stroke [facility]) 03/2017   "left side is weaker since" (10/28/2017)  . Type I diabetes mellitus (HCC)    "dx'd 2005; can't afford insulin so I use pills" (10/28/2017)    Patient  Active Problem List   Diagnosis Date Noted  . Adjustment disorder with mixed anxiety and depressed mood 11/01/2017  . Chest pain 10/28/2017  . MDD (major depressive disorder), recurrent severe, without psychosis (HCC) 10/15/2017  . MDD (major depressive disorder) 04/12/2015  . Substance induced mood disorder (HCC) 04/04/2015  . Severe episode of recurrent major depressive disorder, without psychotic features (HCC)   . GAD (generalized anxiety disorder)     Past Surgical History:  Procedure Laterality Date  . CHOLECYSTECTOMY OPEN    . NEPHRECTOMY Left 1975       Family History  Problem Relation Age of Onset  . Healthy Mother   . Heart attack Father 27  . Pancreatic cancer Father   . Healthy Sister   . Healthy Brother     Social History   Tobacco Use  . Smoking status: Former Smoker    Packs/day: 0.50    Years: 29.00    Pack years: 14.50    Types: Cigarettes    Quit date: 04/15/2017    Years since quitting: 2.1  . Smokeless tobacco: Never Used  Substance Use Topics  . Alcohol use: No    Comment: 10/28/2017 "nothing to drink since 2005; I'm at [facility] now"  . Drug use: Yes    Types: Cocaine, Marijuana, Methamphetamines    Home Medications Prior to Admission medications   Medication Sig Start Date End Date  Taking? Authorizing Provider  acetaminophen (TYLENOL) 325 MG tablet Take 2 tablets (650 mg total) by mouth every 4 (four) hours as needed for headache or mild pain. 10/30/17   Regalado, Belkys A, MD  albuterol (PROVENTIL HFA;VENTOLIN HFA) 108 (90 Base) MCG/ACT inhaler Inhale 1-2 puffs into the lungs every 6 (six) hours as needed for wheezing or shortness of breath. 10/30/17   Regalado, Belkys A, MD  amLODipine (NORVASC) 10 MG tablet Take 1 tablet (10 mg total) by mouth daily. For high blood pressure 10/30/17   Regalado, Belkys A, MD  atenolol (TENORMIN) 50 MG tablet Take 1 tablet (50 mg total) by mouth daily. For high blood pressure 10/30/17   Regalado, Belkys A, MD    busPIRone (BUSPAR) 15 MG tablet Take 1 tablet (15 mg total) by mouth 2 (two) times daily. For anxiety 10/27/17   Armandina Stammer I, NP  cyclobenzaprine (FLEXERIL) 5 MG tablet Take 1 tablet (5 mg total) by mouth 3 (three) times daily as needed for muscle spasms. 10/27/17   Armandina Stammer I, NP  divalproex (DEPAKOTE ER) 500 MG 24 hr tablet Take 2 tablets (1,000 mg total) by mouth at bedtime. For mood stabilization 10/27/17   Armandina Stammer I, NP  FLUoxetine (PROZAC) 40 MG capsule Take 1 capsule (40 mg total) by mouth daily. For depression 10/28/17   Armandina Stammer I, NP  gabapentin (NEURONTIN) 300 MG capsule Take 1 capsule (300 mg total) by mouth 4 (four) times daily -  with meals and at bedtime. For agitation 10/27/17   Armandina Stammer I, NP  hydrOXYzine (ATARAX/VISTARIL) 25 MG tablet Take 1 tablet (25 mg total) by mouth 3 (three) times daily as needed for anxiety. 10/27/17   Armandina Stammer I, NP  lamoTRIgine (LAMICTAL) 25 MG tablet Take 1 tablet (25 mg total) by mouth 2 (two) times daily. For mood stabilization 10/27/17   Armandina Stammer I, NP  metFORMIN (GLUCOPHAGE) 500 MG tablet Take 500 mg by mouth daily with breakfast.    [provider]  mirtazapine (REMERON) 15 MG tablet Take 1 tablet (15 mg total) by mouth at bedtime. For depression/sleep 10/27/17   Armandina Stammer I, NP  naproxen (NAPROSYN) 500 MG tablet Take 1 tablet (500 mg total) by mouth 2 (two) times daily with a meal. 06/01/19   Eber Hong, MD  pantoprazole (PROTONIX) 40 MG tablet Take 1 tablet (40 mg total) by mouth 2 (two) times daily. 10/30/17   Regalado, Belkys A, MD  sucralfate (CARAFATE) 1 g tablet Take 1 tablet (1 g total) by mouth 4 (four) times daily. 11/17/17   Sabas Sous, MD    Allergies    Ceftin [cefuroxime axetil], Reglan [metoclopramide], and Penicillins  Review of Systems   Review of Systems  All other systems reviewed and are negative.   Physical Exam Updated Vital Signs BP 139/90   Pulse 72   Temp 98.7 F (37.1 C) (Oral)    Resp 18   Ht 1.753 m (5\' 9" )   Wt 111.1 kg   SpO2 99%   BMI 36.18 kg/m   Physical Exam Vitals and nursing note reviewed.  Constitutional:      General: He is not in acute distress.    Appearance: He is well-developed.  HENT:     Head: Normocephalic and atraumatic.     Mouth/Throat:     Pharynx: No oropharyngeal exudate.  Eyes:     General: No scleral icterus.       Right eye: No discharge.  Left eye: No discharge.     Conjunctiva/sclera: Conjunctivae normal.     Pupils: Pupils are equal, round, and reactive to light.  Neck:     Thyroid: No thyromegaly.     Vascular: No JVD.  Cardiovascular:     Rate and Rhythm: Normal rate and regular rhythm.     Heart sounds: Normal heart sounds. No murmur. No friction rub. No gallop.   Pulmonary:     Effort: Pulmonary effort is normal. No respiratory distress.     Breath sounds: Normal breath sounds. No wheezing or rales.  Abdominal:     General: Bowel sounds are normal. There is no distension.     Palpations: Abdomen is soft. There is no mass.     Tenderness: There is no abdominal tenderness.  Musculoskeletal:        General: No tenderness. Normal range of motion.     Cervical back: Normal range of motion and neck supple.  Lymphadenopathy:     Cervical: No cervical adenopathy.  Skin:    General: Skin is warm and dry.     Findings: No erythema or rash.  Neurological:     Mental Status: He is alert.     Coordination: Coordination normal.  Psychiatric:        Behavior: Behavior normal.     ED Results / Procedures / Treatments   Labs (all labs ordered are listed, but only abnormal results are displayed) Labs Reviewed  COMPREHENSIVE METABOLIC PANEL - Abnormal; Notable for the following components:      Result Value   CO2 21 (*)    Glucose, Bld 119 (*)    Total Protein 6.1 (*)    All other components within normal limits  URINALYSIS, ROUTINE W REFLEX MICROSCOPIC - Abnormal; Notable for the following components:    Color, Urine AMBER (*)    Specific Gravity, Urine 1.032 (*)    Ketones, ur 5 (*)    All other components within normal limits  LIPASE, BLOOD  CBC  TROPONIN I (HIGH SENSITIVITY)    EKG EKG Interpretation  Date/Time:  Tuesday June 01 2019 15:22:51 EST Ventricular Rate:  91 PR Interval:  156 QRS Duration: 82 QT Interval:  340 QTC Calculation: 418 R Axis:   87 Text Interpretation: Normal sinus rhythm Possible Anterior infarct , age undetermined Abnormal ECG since last tracing no significant change Confirmed by Noemi Chapel [phone]) on 06/01/2019 6:02:56 PM   Radiology No results found.  Procedures Procedures (including critical care time)  Medications Ordered in ED Medications  naproxen (NAPROSYN) tablet 500 mg (has no administration in time range)    ED Course  I have reviewed the triage vital signs and the nursing notes.  Pertinent labs & imaging results that were available during my care of the patient were reviewed by me and considered in my medical decision making (see chart for details).    MDM Rules/Calculators/A&P                      This patient is in no distress, his EKG is unremarkable and reassuring, his lab work all very unremarkable including troponin, metabolic panel, urinalysis, lipase and a CBC.  X-ray pending, EKG reassuring, patient likely here with chronic pain, homeless, can be discharged into the outpatient setting.  Initially the patient was talking about how much she [place] the mountains, how he was traveling looking for another place to live, he was happy and interactive.  When the patient was told  that his imaging and studies and lab work were normal and that he was go to be discharged he then states "I think I am 1 to kill myself then.  I do not think this patient has true suicidal intent, he has multiple visits in the past for evaluations for suicide which ends up being malingering.  At this time he is stable for discharge   Final Clinical  Impression(s) / ED Diagnoses Final diagnoses:  Chronic chest pain  Chronic abdominal pain    Rx / DC Orders ED Discharge Orders         Ordered    naproxen (NAPROSYN) 500 MG tablet  2 times daily with meals     06/01/19 1835           Eber Hong, MD 06/01/19 1836

## 2019-06-01 NOTE — ED Triage Notes (Signed)
[place]IB [place] bus [place]. Pt reports L sided CP onset 1hr PTA with radiation into L arm. Pt was seen at [facility] 2 days ago for same with unremarkable workup. Also reports gen abd pain, pt states hx of pancreatitis. Pt in NAD.

## 2023-01-14 ENCOUNTER — Emergency Department (HOSPITAL_COMMUNITY)
Admission: EM | Admit: 2023-01-14 | Discharge: 2023-01-14 | Disposition: A | Discharge status: Home / Self Care | Payer: MEDICAID | Attending: Emergency Medicine | Admitting: Emergency Medicine

## 2023-01-14 ENCOUNTER — Other Ambulatory Visit: Payer: Self-pay

## 2023-01-14 ENCOUNTER — Encounter (HOSPITAL_COMMUNITY): Payer: Self-pay | Admitting: Emergency Medicine

## 2023-01-14 ENCOUNTER — Emergency Department (HOSPITAL_COMMUNITY): Payer: MEDICAID

## 2023-01-14 DIAGNOSIS — F32A Depression, unspecified: Secondary | ICD-10-CM

## 2023-01-14 DIAGNOSIS — E1122 Type 2 diabetes mellitus with diabetic chronic kidney disease: Secondary | ICD-10-CM | POA: Insufficient documentation

## 2023-01-14 DIAGNOSIS — F4323 Adjustment disorder with mixed anxiety and depressed mood: Secondary | ICD-10-CM | POA: Diagnosis not present

## 2023-01-14 DIAGNOSIS — N189 Chronic kidney disease, unspecified: Secondary | ICD-10-CM | POA: Insufficient documentation

## 2023-01-14 DIAGNOSIS — I129 Hypertensive chronic kidney disease with stage 1 through stage 4 chronic kidney disease, or unspecified chronic kidney disease: Secondary | ICD-10-CM | POA: Insufficient documentation

## 2023-01-14 DIAGNOSIS — K2900 Acute gastritis without bleeding: Secondary | ICD-10-CM

## 2023-01-14 DIAGNOSIS — R079 Chest pain, unspecified: Secondary | ICD-10-CM | POA: Diagnosis present

## 2023-01-14 DIAGNOSIS — Z7984 Long term (current) use of oral hypoglycemic drugs: Secondary | ICD-10-CM | POA: Insufficient documentation

## 2023-01-14 DIAGNOSIS — R45851 Suicidal ideations: Secondary | ICD-10-CM

## 2023-01-14 DIAGNOSIS — Z79899 Other long term (current) drug therapy: Secondary | ICD-10-CM | POA: Insufficient documentation

## 2023-01-14 LAB — URINALYSIS, W/ REFLEX TO CULTURE (INFECTION SUSPECTED)
Bacteria, UA: NONE SEEN
Bilirubin Urine: NEGATIVE
Glucose, UA: NEGATIVE mg/dL
Hgb urine dipstick: NEGATIVE
Ketones, ur: NEGATIVE mg/dL
Leukocytes,Ua: NEGATIVE
Nitrite: NEGATIVE
Protein, ur: NEGATIVE mg/dL
Specific Gravity, Urine: 1.024 (ref 1.005–1.030)
pH: 6 (ref 5.0–8.0)

## 2023-01-14 LAB — CBC
HCT: 45.9 % (ref 39.0–52.0)
Hemoglobin: 15.4 g/dL (ref 13.0–17.0)
MCH: 29.4 pg (ref 26.0–34.0)
MCHC: 33.6 g/dL (ref 30.0–36.0)
MCV: 87.8 fL (ref 80.0–100.0)
Platelets: 288 10*3/uL (ref 150–400)
RBC: 5.23 MIL/uL (ref 4.22–5.81)
RDW: 12.5 % (ref 11.5–15.5)
WBC: 9 10*3/uL (ref 4.0–10.5)
nRBC: 0 % (ref 0.0–0.2)

## 2023-01-14 LAB — HEPATIC FUNCTION PANEL
ALT: 20 U/L (ref 0–44)
AST: 17 U/L (ref 15–41)
Albumin: 3.9 g/dL (ref 3.5–5.0)
Alkaline Phosphatase: 64 U/L (ref 38–126)
Bilirubin, Direct: <0.1 mg/dL (ref 0.0–0.2)
Total Bilirubin: 0.5 mg/dL (ref 0.3–1.2)
Total Protein: 7.2 g/dL (ref 6.5–8.1)

## 2023-01-14 LAB — BASIC METABOLIC PANEL
Anion gap: 9 (ref 5–15)
BUN: 18 mg/dL (ref 6–20)
CO2: 25 mmol/L (ref 22–32)
Calcium: 9.2 mg/dL (ref 8.9–10.3)
Chloride: 104 mmol/L (ref 98–111)
Creatinine, Ser: 0.99 mg/dL (ref 0.61–1.24)
GFR, Estimated: >60 mL/min (ref >60)
Glucose, Bld: 119 mg/dL — ABNORMAL HIGH (ref 70–99)
Potassium: 4.1 mmol/L (ref 3.5–5.1)
Sodium: 138 mmol/L (ref 135–145)

## 2023-01-14 LAB — LIPASE, BLOOD: Lipase: 36 U/L (ref 11–51)

## 2023-01-14 LAB — TROPONIN I (HIGH SENSITIVITY)
Troponin I (High Sensitivity): 3 ng/L (ref <18)
Troponin I (High Sensitivity): 5 ng/L (ref <18)

## 2023-01-14 MED ORDER — IBUPROFEN 400 MG PO TABS
400.0000 mg | ORAL_TABLET | Freq: Once | ORAL | Status: AC
  Administered 2023-01-14: 400 mg via ORAL
  Filled 2023-01-14: qty 1

## 2023-01-14 MED ORDER — CARVEDILOL 3.125 MG PO TABS: 6.2500 mg | ORAL_TABLET | Freq: Two times a day (BID) | ORAL | Status: DC

## 2023-01-14 MED ORDER — SUCRALFATE 1 G PO TABS
1.0000 g | ORAL_TABLET | Freq: Four times a day (QID) | ORAL | Status: DC
  Administered 2023-01-14: 1 g via ORAL
  Filled 2023-01-14: qty 1

## 2023-01-14 MED ORDER — METFORMIN HCL 500 MG PO TABS: 500.0000 mg | ORAL_TABLET | Freq: Two times a day (BID) | ORAL | Status: DC

## 2023-01-14 MED ORDER — DIVALPROEX SODIUM 250 MG PO DR TAB
500.0000 mg | DELAYED_RELEASE_TABLET | Freq: Two times a day (BID) | ORAL | Status: DC
  Administered 2023-01-14: 500 mg via ORAL
  Filled 2023-01-14: qty 2

## 2023-01-14 MED ORDER — ATORVASTATIN CALCIUM 10 MG PO TABS: 20.0000 mg | ORAL_TABLET | Freq: Every day | ORAL | Status: DC

## 2023-01-14 MED ORDER — LURASIDONE HCL 40 MG PO TABS
60.0000 mg | ORAL_TABLET | Freq: Every day | ORAL | Status: DC
  Filled 2023-01-14: qty 1

## 2023-01-14 MED ORDER — CLOPIDOGREL BISULFATE 75 MG PO TABS
75.0000 mg | ORAL_TABLET | Freq: Every day | ORAL | Status: DC
  Administered 2023-01-14: 75 mg via ORAL
  Filled 2023-01-14: qty 1

## 2023-01-14 MED ORDER — AMLODIPINE BESYLATE 5 MG PO TABS
5.0000 mg | ORAL_TABLET | Freq: Every day | ORAL | Status: DC
  Administered 2023-01-14: 5 mg via ORAL
  Filled 2023-01-14: qty 1

## 2023-01-14 MED ORDER — ALUM & MAG HYDROXIDE-SIMETH 200-200-20 MG/5ML PO SUSP
30.0000 mL | Freq: Once | ORAL | Status: AC
  Administered 2023-01-14: 30 mL via ORAL
  Filled 2023-01-14: qty 30

## 2023-01-14 MED ORDER — BUSPIRONE HCL 10 MG PO TABS
10.0000 mg | ORAL_TABLET | Freq: Three times a day (TID) | ORAL | Status: DC
  Administered 2023-01-14: 10 mg via ORAL
  Filled 2023-01-14: qty 1

## 2023-01-14 MED ORDER — TRAZODONE HCL 50 MG PO TABS: 50.0000 mg | ORAL_TABLET | Freq: Every day | ORAL | Status: DC

## 2023-01-14 MED ORDER — HYDROCODONE-ACETAMINOPHEN 5-325 MG PO TABS
1.0000 | ORAL_TABLET | Freq: Once | ORAL | Status: AC
  Administered 2023-01-14: 1 via ORAL
  Filled 2023-01-14: qty 1

## 2023-01-14 NOTE — ED Notes (Signed)
Pt states pain is not getting better, asking if there is something else he can take... EDP made aware

## 2023-01-14 NOTE — Consult Note (Signed)
BH ED ASSESSMENT   Reason for Consult:  psych eval Referring Physician:  Anitra Lauth Patient Identification: Barry Daugherty MRN:  [id number] ED Chief Complaint: Adjustment disorder with mixed anxiety and depressed mood  Diagnosis:  Principal Problem:   Adjustment disorder with mixed anxiety and depressed mood   ED Assessment Time Calculation: Start Time: 1000 Stop Time: 1045 Total Time in Minutes (Assessment Completion): 45   HPI:   Barry Daugherty is a 52 y.o. male patient with a history of prior MIs, stroke, chronic kidney disease, diabetes, hypertension, polysubstance use with cocaine, marijuana, alcohol and bipolar disease who is presenting today with complaint of chest pain. Patient reports for the last few weeks he has been at [facility] getting treatment for depression and suicidal ideation and was being discharged from there last night and going to a rehab facility when during transport he started getting pain in the left side of his chest that went to his jaw and into his abdomen. He reports the pain is now been there for 6 hours and has not resolved. He has had a mild cough but denies any productive cough or fever. The pain is worse with taking a deep breath. He has not had a fever and denies any vomiting or diarrhea. He does report he has had abdominal discomfort for the last few weeks but he had been able to eat and drink while he was at [facility]. He also reports now after sitting and reflecting he remembered it was the month of his mom's death and he reports feeling much more depressed now and states that he starting to feel slightly suicidal again.   Pt has been medically cleared.  Patient has presented to various Emergency Departments in [place] mostly 16 times since August. Most presentations for chest pain or SI.   Subjective:   Patient seen this morning at [facility], ED for face-to-face psychiatric evaluation.  Patient tells me he was receiving inpatient treatment at [facility] for close to 2 weeks.  He discharged this morning and was supposed to be transporting to a residential substance abuse treatment facility in [facility].  His interview was today at 10 AM.  However he states in the car he started feeling chest pain that would not resolve and was then brought to [facility] for evaluation.  Patient has since been medically cleared.  Patient stated he also remembered is the month anniversary of his mother's death who died around 13 years ago.  He reports feeling depressed and also wanted to speak to behavioral health.  Patient asked since he missed his intake appointment for the residential rehab if he could just go back to inpatient treatment for the time being.  Patient did disclose he is homeless, he normally stays in the [place] area and is familiar with the services. He has several siblings and family members, however they do not speak with him anymore.  He denies any current suicidal ideations.  Denies homicidal ideations.  Denies any auditory or visual hallucinations.  I informed patient of his numerous ED presentations and and that he has received inpatient psychiatric treatment 3 times in the past 2 months.  I informed him he currently does not meet inpatient psychiatric criteria, especially considering he was just discharged from an inpatient treatment facility today.  Do suspect patient is malingering with secondary gain of housing now that he is missed his intake appointment at the rehab facility.  After further discussion patient is able to contract for safety  and states he will be fine as long as the hospital can somehow transport him back to [facility].  I informed the patient we would speak with social work to see what we could accommodate.  Patient ongoing endorsement of suicidal ideation shows clear evidence of secondary gain of unmet needs that is representative of limited and often- maladaptive coping skills and rather than an indicator  of imminent risk of death.  Evidence indicates that subsequent suicide attempts by patients who made contingent suicide threats (defined as threatened suicide or exaggerated suicidality) are uncommon in both groups.  Hospitalization should not be used as a substitute for social services, substance abuse treatment, and legal assistance for patients who make contingent suicide threats (Characteristics and six-month outcome of patients who use suicide threats to seek hospital admission.  (1966). Psychiatric Services, 47(8), [phone]. (DOI: 10.1176/ps.47.8.871).    Patient has not benefited from past hospitalization under similar circumstances in terms of suicide risk modification, improvement in mental health, or social conditions.  Psychiatric hospitalization is not indicated, and the patient's refusal of interventions that have been offered by clinical care teams during prior stays in the ED, UC and during psychiatric hospitalization to mitigate risk of self-harm, other than allowing care team to observe to sobriety or behavior.  This provider and Dr. Lucianne Muss have discussed assessment and treatment recommendations with patient.  Further we see no evidence of severe psychosis, cognitive impairment, intoxication, or other condition that prevents the patient from acting under their own choice and volition.      Past Psychiatric History:  See above  Risk to Self or Others: Is the patient at risk to self? No Has the patient been a risk to self in the past 6 months? Yes Has the patient been a risk to self within the distant past? Yes Is the patient a risk to others? No Has the patient been a risk to others in the past 6 months? No Has the patient been a risk to others within the distant past? No  [place] Scale:  Flowsheet Row ED from 01/14/2023 in [facility] Emergency Department at [facility] ED from 11/09/2017 in [facility] Emergency Department at [facility] ED from 10/31/2017 in [facility] Emergency Department at [facility]  C-SSRS RISK CATEGORY Low Risk High Risk High Risk       Past Medical History:  Past Medical History:  Diagnosis Date   Anxiety    Asthma    Bipolar 1 disorder (HCC)    Brain injury (HCC)    Chronic kidney disease    nephrectomy 1975   Depression    Grand mal seizure (HCC)    "controlled w/RX" (10/28/2017)   Headache    "couple/week" (10/28/2017)   High cholesterol    Hypertension    Migraine    "couple/year" (10/28/2017)   Myocardial infarction (HCC) 04/2017   Sleep apnea    "suppose to have CPAP; I don't have one" (10/28/2017)   Stroke (HCC) 03/2017   "left side is weaker since" (10/28/2017)   Type I diabetes mellitus (HCC)    "dx'd 2005; can't afford insulin so I use pills" (10/28/2017)    Past Surgical History:  Procedure Laterality Date   CHOLECYSTECTOMY OPEN     NEPHRECTOMY Left 1975   Family History:  Family History  Problem Relation Age of Onset   Healthy Mother    Heart attack Father 86   Pancreatic cancer Father    Healthy Sister  Healthy Brother    Social History:  Social History   Substance and Sexual Activity  Alcohol Use No   Comment: 10/28/2017 "nothing to drink since 2005; I'm at [facility] now"     Social History   Substance and Sexual Activity  Drug Use Yes   Types: Cocaine, Marijuana, Methamphetamines    Social History   Socioeconomic History   Marital status: Single    Spouse name: Not on file   Number of children: Not on file   Years of education: Not on file   Highest education level: Not on file  Occupational History   Not on file  Tobacco Use   Smoking status: Former    Current packs/day: 0.00    Average packs/day: 0.5 packs/day for 29.0 years (14.5 ttl pk-yrs)    Types: Cigarettes    Start date: 04/15/1988    Quit date: 04/15/2017    Years since quitting: 5.7   Smokeless tobacco: Never  Vaping Use   Vaping status: Never Used  Substance and Sexual Activity   Alcohol  use: No    Comment: 10/28/2017 "nothing to drink since 2005; I'm at [facility] now"   Drug use: Yes    Types: Cocaine, Marijuana, Methamphetamines   Sexual activity: Not Currently  Other Topics Concern   Not on file  Social History Narrative   Not on file   Social Determinants of Health   Financial Resource Strain: High Risk (08/15/2022)   Received from [facility]   Overall Financial Resource Strain (CARDIA)    Difficulty of Paying Living Expenses: Very hard  Food Insecurity: Medium Risk (12/27/2022)   Received from [facility]   Hunger Vital Sign    Worried About Running Out of Food in the Last Year: Sometimes true    Ran Out of Food in the Last Year: Sometimes true  Transportation Needs: No Transportation Needs (12/27/2022)   Received from [facility]    In the past 12 months, has lack of reliable transportation kept you from medical appointments, meetings, work or from getting things needed for daily living? : No  Recent Concern: Transportation Needs - Unmet Transportation Needs (12/02/2022)   Received from [facility] - Transportation    Lack of Transportation (Medical): Yes    Lack of Transportation (Non-Medical): Yes  Physical Activity: Insufficiently Active (01/18/2022)   [facility]   Exercise Vital Sign    Days of Exercise per Week: 7 days    Minutes of Exercise per Session: 10 min  Stress: Stress Concern Present (12/02/2022)   Received from [facility] - Occupational Stress Questionnaire    Feeling of Stress : Rather much  Social Connections: Socially Isolated (10/26/2021)   [facility]   Social Connection and Isolation Panel [NHANES]    Frequency of Communication with Friends and Family: Never    Frequency of Social Gatherings with Friends and Family: Never    Attends Religious Services: Never    Loss adjuster, chartered or Organizations: No    Attends Banker Meetings: Never    Marital Status: Never married   Allergies:   Allergies  Allergen Reactions   Ceftin [Cefuroxime Axetil] Hives   Reglan [Metoclopramide] Itching   Penicillins Hives    Has patient had a PCN reaction causing immediate rash, facial/tongue/throat swelling, SOB or lightheadedness with hypotension: Yes Has patient  had a PCN reaction causing severe rash involving mucus membranes or skin necrosis: No Has patient had a PCN reaction that required hospitalization: No Has patient had a PCN reaction occurring within the last 10 years: No If all of the above answers are "NO", then may proceed with Cephalosporin use.    Toradol [Ketorolac Tromethamine] Hives    Labs:  Results for orders placed or performed during the hospital encounter of 01/14/23 (from the past 48 hour(s))  Basic metabolic panel     Status: Abnormal   Collection Time: 01/14/23  1:58 AM  Result Value Ref Range   Sodium 138 135 - 145 mmol/L   Potassium 4.1 3.5 - 5.1 mmol/L   Chloride 104 98 - 111 mmol/L   CO2 25 22 - 32 mmol/L   Glucose, Bld 119 (H) 70 - 99 mg/dL    Comment: Glucose reference range applies only to samples taken after fasting for at least 8 hours.   BUN 18 6 - 20 mg/dL   Creatinine, Ser 1.19 0.61 - 1.24 mg/dL   Calcium 9.2 8.9 - 14.7 mg/dL   GFR, Estimated >82 >95 mL/min    Comment: (NOTE) Calculated using the CKD-EPI Creatinine Equation (2021)    Anion gap 9 5 - 15    Comment: Performed at [facility], [address], [place], [place] [address]  CBC     Status: None   Collection Time: 01/14/23  1:58 AM  Result Value Ref Range   WBC 9.0 4.0 - 10.5 K/uL   RBC 5.23 4.22 - 5.81 MIL/uL   Hemoglobin 15.4 13.0 - 17.0 g/dL   HCT 86.5 78.4 - 69.6 %   MCV 87.8 80.0 - 100.0 fL   MCH 29.4 26.0 - 34.0 pg   MCHC 33.6 30.0 - 36.0 g/dL   RDW 29.5 28.4 - 13.2 %   Platelets 288 150 - 400 K/uL   nRBC 0.0 0.0 - 0.2 %    Comment:  Performed at [facility], [address] [place], [place] [address]  Troponin I (High Sensitivity)     Status: None   Collection Time: 01/14/23  1:58 AM  Result Value Ref Range   Troponin I (High Sensitivity) 3 <18 ng/L    Comment: (NOTE) Elevated high sensitivity troponin I (hsTnI) values and significant  changes across serial measurements may suggest ACS but many other  chronic and acute conditions are known to elevate hsTnI results.  Refer to the "Links" section for chest pain algorithms and additional  guidance. Performed at [facility], [address], [place], [place] [address]   Hepatic function panel     Status: None   Collection Time: 01/14/23  1:58 AM  Result Value Ref Range   Total Protein 7.2 6.5 - 8.1 g/dL   Albumin 3.9 3.5 - 5.0 g/dL   AST 17 15 - 41 U/L   ALT 20 0 - 44 U/L   Alkaline Phosphatase 64 38 - 126 U/L   Total Bilirubin 0.5 0.3 - 1.2 mg/dL   Bilirubin, Direct <6.6 0.0 - 0.2 mg/dL   Indirect Bilirubin NOT CALCULATED 0.3 - 0.9 mg/dL    Comment: Performed at [facility], [address] [place], [place] [address]  Lipase, blood     Status: None   Collection Time: 01/14/23  1:58 AM  Result Value Ref Range   Lipase 36 11 - 51 U/L    Comment: Performed at [facility],  [address] [place], [place] [address]  Troponin I (High Sensitivity)     Status: None   Collection Time: 01/14/23  4:28 AM  Result Value Ref Range   Troponin I (High Sensitivity) 5 <18 ng/L    Comment: (NOTE) Elevated high sensitivity troponin I (hsTnI) values and significant  changes across serial measurements may suggest ACS but many other  chronic and acute conditions are known to elevate hsTnI results.  Refer to the "Links" section for chest pain algorithms and additional  guidance. Performed at [facility], [address], [place], [place] [address]     Current Facility-Administered Medications  Medication Dose Route Frequency Provider Last  Rate Last Admin   amLODipine (NORVASC) tablet 5 mg  5 mg Oral Daily Plunkett, Whitney, MD       atorvastatin (LIPITOR) tablet 20 mg  20 mg Oral QHS Plunkett, Alphonzo Lemmings, MD       busPIRone (BUSPAR) tablet 10 mg  10 mg Oral TID Gwyneth Sprout, MD       carvedilol (COREG) tablet 6.25 mg  6.25 mg Oral BID WC Gwyneth Sprout, MD       clopidogrel (PLAVIX) tablet 75 mg  75 mg Oral Daily Plunkett, Whitney, MD       divalproex (DEPAKOTE) DR tablet 500 mg  500 mg Oral Q12H Plunkett, Whitney, MD       lurasidone (LATUDA) tablet 60 mg  60 mg Oral QHS Plunkett, Whitney, MD       metFORMIN (GLUCOPHAGE) tablet 500 mg  500 mg Oral BID WC Plunkett, Whitney, MD       sucralfate (CARAFATE) tablet 1 g  1 g Oral QID Gwyneth Sprout, MD       traZODone (DESYREL) tablet 50 mg  50 mg Oral QHS Gwyneth Sprout, MD       Current Outpatient Medications  Medication Sig Dispense Refill   acetaminophen (TYLENOL) 325 MG tablet Take 2 tablets (650 mg total) by mouth every 4 (four) hours as needed for headache or mild pain. 10 tablet 0   albuterol (PROVENTIL HFA;VENTOLIN HFA) 108 (90 Base) MCG/ACT inhaler Inhale 1-2 puffs into the lungs every 6 (six) hours as needed for wheezing or shortness of breath. 3 Inhaler 0   amLODipine (NORVASC) 10 MG tablet Take 1 tablet (10 mg total) by mouth daily. For high blood pressure 30 tablet 0   atenolol (TENORMIN) 50 MG tablet Take 1 tablet (50 mg total) by mouth daily. For high blood pressure 30 tablet 0   busPIRone (BUSPAR) 15 MG tablet Take 1 tablet (15 mg total) by mouth 2 (two) times daily. For anxiety 60 tablet 0   cyclobenzaprine (FLEXERIL) 5 MG tablet Take 1 tablet (5 mg total) by mouth 3 (three) times daily as needed for muscle spasms. 30 tablet 0   divalproex (DEPAKOTE ER) 500 MG 24 hr tablet Take 2 tablets (1,000 mg total) by mouth at bedtime. For mood stabilization 60 tablet 0   FLUoxetine (PROZAC) 40 MG capsule Take 1 capsule (40 mg total) by mouth daily. For depression 30  capsule 0   gabapentin (NEURONTIN) 300 MG capsule Take 1 capsule (300 mg total) by mouth 4 (four) times daily -  with meals and at bedtime. For agitation 120 capsule 0   hydrOXYzine (ATARAX/VISTARIL) 25 MG tablet Take 1 tablet (25 mg total) by mouth 3 (three) times daily as needed for anxiety. 60 tablet 0   lamoTRIgine (LAMICTAL) 25 MG tablet Take 1 tablet (25 mg total)  by mouth 2 (two) times daily. For mood stabilization 60 tablet 0   metFORMIN (GLUCOPHAGE) 500 MG tablet Take 500 mg by mouth daily with breakfast.     mirtazapine (REMERON) 15 MG tablet Take 1 tablet (15 mg total) by mouth at bedtime. For depression/sleep 30 tablet 0   naproxen (NAPROSYN) 500 MG tablet Take 1 tablet (500 mg total) by mouth 2 (two) times daily with a meal. 30 tablet 0   pantoprazole (PROTONIX) 40 MG tablet Take 1 tablet (40 mg total) by mouth 2 (two) times daily. 60 tablet 0   sucralfate (CARAFATE) 1 g tablet Take 1 tablet (1 g total) by mouth 4 (four) times daily. 45 tablet 0    Psychiatric Specialty Exam: Presentation  General Appearance:  Appropriate for Environment  Eye Contact: Fair  Speech: Clear and Coherent  Speech Volume: Normal  Handedness:No data recorded  Mood and Affect  Mood: Anxious  Affect: Congruent   Thought Process  Thought Processes: Coherent  Descriptions of Associations:Intact  Orientation:Full (Time, Place and Person)  Thought Content:Logical  History of Schizophrenia/Schizoaffective disorder:No data recorded Duration of Psychotic Symptoms:No data recorded Hallucinations:Hallucinations: None  Ideas of Reference:None  Suicidal Thoughts:Suicidal Thoughts: No  Homicidal Thoughts:Homicidal Thoughts: No   Sensorium  Memory: Immediate Fair; Recent Fair  Judgment: Fair  Insight: Fair   Art therapist  Concentration: Good  Attention Span: Good  Recall: Good  Fund of Knowledge: Good  Language: Good   Psychomotor Activity   Psychomotor Activity: Psychomotor Activity: Normal   Assets  Assets: Desire for Improvement; Physical Health; Resilience; Social Support    Sleep  Sleep: Sleep: Good   Physical Exam: Physical Exam Neurological:     Mental Status: He is alert and oriented to person, place, and time.  Psychiatric:        Attention and Perception: Attention normal.        Mood and Affect: Mood is depressed.        Speech: Speech normal.        Behavior: Behavior is cooperative.        Thought Content: Thought content normal.    Review of Systems  Psychiatric/Behavioral:  Positive for depression.   All other systems reviewed and are negative.  Blood pressure 126/77, pulse 82, temperature 97.8 F (36.6 C), temperature source Oral, resp. rate 17, height 5\' 9"  (1.753 m), weight 104.3 kg, SpO2 99%. Body mass index is 33.97 kg/m.  Medical Decision Making: Pt case reviewed and discussed with Dr. Lucianne Muss. Pt does not meet criteria for IVC or inpatient psychiatric treatment. Pt is psych cleared for discharge.   - TOC order placed to assist with possible transport back to [facility] provided in AVS  Disposition: No evidence of imminent risk to self or others at present.   Patient does not meet criteria for psychiatric inpatient admission. Supportive therapy provided about ongoing stressors. Discussed crisis plan, support from social network, calling 911, coming to the Emergency Department, and calling Suicide Hotline.  Eligha Bridegroom, NP 01/14/2023 10:50 AM

## 2023-01-14 NOTE — ED Provider Notes (Addendum)
[facility] AT [facility] Provider Note   CSN: [id number] Arrival date & time: 01/14/23  0136     History  Chief Complaint  Patient presents with   Chest Pain    Barry Daugherty is a 52 y.o. male.  Patient is a 52 year old male with a history of prior MIs, stroke, chronic kidney disease, diabetes, hypertension, polysubstance use with cocaine, marijuana, alcohol and bipolar disease who is presenting today with complaint of chest pain.  Patient reports for the last few weeks he has been at [facility] getting treatment for depression and suicidal ideation and was being discharged from there last night and going to a rehab facility when during transport he started getting pain in the left side of his chest that went to his jaw and into his abdomen.  He reports the pain is now been there for 6 hours and has not resolved.  He has had a mild cough but denies any productive cough or fever.  The pain is worse with taking a deep breath.  He has not had a fever and denies any vomiting or diarrhea.  He does report he has had abdominal discomfort for the last few weeks but he had been able to eat and drink while he was at [facility].  He also reports now after sitting and reflecting he remembered it was the month of his mom's death and he reports feeling much more depressed now and states that he starting to feel slightly suicidal again.  The history is provided by the patient and medical records.  Chest Pain      Home Medications Prior to Admission medications   Medication Sig Start Date End Date Taking? Authorizing Provider  acetaminophen (TYLENOL) 325 MG tablet Take 2 tablets (650 mg total) by mouth every 4 (four) hours as needed for headache or mild pain. 10/30/17   Regalado, Belkys A, MD  albuterol (PROVENTIL HFA;VENTOLIN HFA) 108 (90 Base) MCG/ACT inhaler Inhale 1-2 puffs into the lungs every 6 (six) hours as needed for wheezing or shortness of breath. 10/30/17    Regalado, Belkys A, MD  amLODipine (NORVASC) 10 MG tablet Take 1 tablet (10 mg total) by mouth daily. For high blood pressure 10/30/17   Regalado, Belkys A, MD  atenolol (TENORMIN) 50 MG tablet Take 1 tablet (50 mg total) by mouth daily. For high blood pressure 10/30/17   Regalado, Belkys A, MD  busPIRone (BUSPAR) 15 MG tablet Take 1 tablet (15 mg total) by mouth 2 (two) times daily. For anxiety 10/27/17   Armandina Stammer I, NP  cyclobenzaprine (FLEXERIL) 5 MG tablet Take 1 tablet (5 mg total) by mouth 3 (three) times daily as needed for muscle spasms. 10/27/17   Armandina Stammer I, NP  divalproex (DEPAKOTE ER) 500 MG 24 hr tablet Take 2 tablets (1,000 mg total) by mouth at bedtime. For mood stabilization 10/27/17   Armandina Stammer I, NP  FLUoxetine (PROZAC) 40 MG capsule Take 1 capsule (40 mg total) by mouth daily. For depression 10/28/17   Armandina Stammer I, NP  gabapentin (NEURONTIN) 300 MG capsule Take 1 capsule (300 mg total) by mouth 4 (four) times daily -  with meals and at bedtime. For agitation 10/27/17   Armandina Stammer I, NP  hydrOXYzine (ATARAX/VISTARIL) 25 MG tablet Take 1 tablet (25 mg total) by mouth 3 (three) times daily as needed for anxiety. 10/27/17   Armandina Stammer I, NP  lamoTRIgine (LAMICTAL) 25 MG tablet Take 1 tablet (25 mg  total) by mouth 2 (two) times daily. For mood stabilization 10/27/17   Armandina Stammer I, NP  metFORMIN (GLUCOPHAGE) 500 MG tablet Take 500 mg by mouth daily with breakfast.    [provider]  mirtazapine (REMERON) 15 MG tablet Take 1 tablet (15 mg total) by mouth at bedtime. For depression/sleep 10/27/17   Armandina Stammer I, NP  naproxen (NAPROSYN) 500 MG tablet Take 1 tablet (500 mg total) by mouth 2 (two) times daily with a meal. 06/01/19   Eber Hong, MD  pantoprazole (PROTONIX) 40 MG tablet Take 1 tablet (40 mg total) by mouth 2 (two) times daily. 10/30/17   Regalado, Belkys A, MD  sucralfate (CARAFATE) 1 g tablet Take 1 tablet (1 g total) by mouth 4 (four) times daily. 11/17/17    Sabas Sous, MD      Allergies    Ceftin [cefuroxime axetil], Reglan [metoclopramide], Penicillins, and Toradol [ketorolac tromethamine]    Review of Systems   Review of Systems  Cardiovascular:  Positive for chest pain.    Physical Exam Updated Vital Signs BP 126/77 (BP Location: Right Arm)   Pulse 82   Temp 97.8 F (36.6 C) (Oral)   Resp 17   Ht 5\' 9"  (1.753 m)   Wt 104.3 kg   SpO2 99%   BMI 33.97 kg/m  Physical Exam Vitals and nursing note reviewed.  Constitutional:      General: He is not in acute distress.    Appearance: He is well-developed.  HENT:     Head: Normocephalic and atraumatic.     Mouth/Throat:     Mouth: Mucous membranes are moist.  Eyes:     Conjunctiva/sclera: Conjunctivae normal.     Pupils: Pupils are equal, round, and reactive to light.  Cardiovascular:     Rate and Rhythm: Normal rate and regular rhythm.     Heart sounds: No murmur heard. Pulmonary:     Effort: Pulmonary effort is normal. No respiratory distress.     Breath sounds: Normal breath sounds. No wheezing or rales.  Chest:     Chest wall: No tenderness.  Abdominal:     General: There is no distension.     Palpations: Abdomen is soft.     Tenderness: There is abdominal tenderness in the right upper quadrant, epigastric area and left upper quadrant. There is no guarding or rebound.  Musculoskeletal:        General: No tenderness. Normal range of motion.     Cervical back: Normal range of motion and neck supple.     Right lower leg: No edema.     Left lower leg: No edema.  Skin:    General: Skin is warm and dry.     Findings: No erythema or rash.  Neurological:     Mental Status: He is alert and oriented to person, place, and time.  Psychiatric:        Behavior: Behavior normal.     ED Results / Procedures / Treatments   Labs (all labs ordered are listed, but only abnormal results are displayed) Labs Reviewed  BASIC METABOLIC PANEL - Abnormal; Notable for the  following components:      Result Value   Glucose, Bld 119 (*)    All other components within normal limits  CBC  HEPATIC FUNCTION PANEL  LIPASE, BLOOD  TROPONIN I (HIGH SENSITIVITY)  TROPONIN I (HIGH SENSITIVITY)    EKG EKG Interpretation Date/Time:  Tuesday January 14 2023 01:33:25 EDT Ventricular Rate:  85 PR Interval:  168 QRS Duration:  84 QT Interval:  380 QTC Calculation: 452 R Axis:   30  Text Interpretation: Normal sinus rhythm Normal ECG When compared with ECG of 01-Jun-2019 15:22, PREVIOUS ECG IS PRESENT No significant change since last tracing Confirmed by Gwyneth Sprout ([id number]) on 01/14/2023 7:53:10 AM  Radiology DG Chest 2 View  Result Date: 01/14/2023 CLINICAL DATA:  Chest pain EXAM: CHEST - 2 VIEW COMPARISON:  06/01/2019 FINDINGS: The heart size and mediastinal contours are within normal limits. Both lungs are clear. The visualized skeletal structures are unremarkable. IMPRESSION: No active cardiopulmonary disease. Electronically Signed   By: Burman Nieves M.D.   On: 01/14/2023 02:40    Procedures Procedures    Medications Ordered in ED Medications  amLODipine (NORVASC) tablet 5 mg (has no administration in time range)  busPIRone (BUSPAR) tablet 10 mg (has no administration in time range)  metFORMIN (GLUCOPHAGE) tablet 500 mg (has no administration in time range)  sucralfate (CARAFATE) tablet 1 g (has no administration in time range)  divalproex (DEPAKOTE) DR tablet 500 mg (has no administration in time range)  lurasidone (LATUDA) tablet 60 mg (has no administration in time range)  traZODone (DESYREL) tablet 50 mg (has no administration in time range)  atorvastatin (LIPITOR) tablet 20 mg (has no administration in time range)  carvedilol (COREG) tablet 6.25 mg (has no administration in time range)  clopidogrel (PLAVIX) tablet 75 mg (has no administration in time range)  alum & mag hydroxide-simeth (MAALOX/MYLANTA) 200-200-20 MG/5ML suspension 30 mL (30  mLs Oral Given 01/14/23 0811)  HYDROcodone-acetaminophen (NORCO/VICODIN) 5-325 MG per tablet 1 tablet (1 tablet Oral Given 01/14/23 6962)    ED Course/ Medical Decision Making/ A&P                                 Medical Decision Making Amount and/or Complexity of Data Reviewed Labs: ordered. Decision-making details documented in ED Course. Radiology: ordered and independent interpretation performed. Decision-making details documented in ED Course. ECG/medicine tests: ordered and independent interpretation performed. Decision-making details documented in ED Course.  Risk OTC drugs. Prescription drug management.   Pt with multiple medical problems and comorbidities and presenting today with a complaint that caries a high risk for morbidity and mortality.  Here today with nonspecific chest pain.  Feel that patient does need ruled out for ACS given his prior history of MIs however also concern for GI pathology as he has had some upper abdominal pain has recently restarted medications.  PERC negative and low suspicion for PE at this time.  Patient's symptoms are not classic for dissection and he has normal blood pressure, equal pulses.  Patient has had a prior history of pancreatitis and will rule out pancreatitis or hepatitis.  I independently interpreted patient's EKG today which is normal.  I independently interpreted patient's labs and troponin x 2 are negative, CBC and BMP are within normal limits.  Will check lipase and LFTs.  Patient given GI cocktail and pain control.  I have independently visualized and interpreted pt's images today. Chest x-ray without acute findings.  Discussing the findings with the patient reassuring him that low suspicion that this is cardiac at this time offered him calling the rehab facility to see if they would be willing to evaluate him today versus if he felt safe to do that and he states that he was not feeling safe and needed to talk with behavioral  health.  10:27 AM LFTs and lipase wnl.  Pt medically clear to speak with psych  11:03 AM Pt seen by psych and is cleared.  Will consult TOC to see if we can get him back to the rehab he was supposed to be going to.        Final Clinical Impression(s) / ED Diagnoses Final diagnoses:  Nonspecific chest pain  Acute gastritis without hemorrhage, unspecified gastritis type  Depression, unspecified depression type  Suicidal ideation    Rx / DC Orders ED Discharge Orders     None         Gwyneth Sprout, MD 01/14/23 1027    Gwyneth Sprout, MD 01/14/23 1103

## 2023-01-14 NOTE — ED Notes (Signed)
Labs able to be added on per Delice Bison in lab

## 2023-01-14 NOTE — ED Triage Notes (Signed)
Pt presents for L sided CP while being transported home by sheriff after being IVC'ed at [facility]. Radiates to L arm, jaw, back. No medications pta.  H/o multiple MI , 3 stents, plavix

## 2023-01-14 NOTE — Discharge Instructions (Addendum)
Follow up with your regular doctor.  Thing with your heart today is normal.  The symptoms you are having is most likely related to your stomach.  You can use Maalox or Tums as needed.  Stay away from spicy or acidic foods.

## 2023-01-14 NOTE — Progress Notes (Signed)
CSW contacted [facility] who told CSW that patients slot was for 10:00 AM this morning. CSW tried to explain that patient was in the emergency room and couldn't get to the appointment. CSW was told by staff that they can no longer accommodate patient but would see if there is a different day patient can come to their facility. CSW updated patient who stated he would like a ride to the [facility] in [place], [address] [place], [place] [address]. CSW contacted safe transport who can provide patient a ride.
